# Patient Record
Sex: Male | Born: 1935
Health system: Southern US, Community
[De-identification: ages and names within clinical notes are randomized; demographics above are authoritative.]

## PROBLEM LIST (undated history)

## (undated) DIAGNOSIS — K259 Gastric ulcer, unspecified as acute or chronic, without hemorrhage or perforation: Secondary | ICD-10-CM

## (undated) DIAGNOSIS — E785 Hyperlipidemia, unspecified: Secondary | ICD-10-CM

## (undated) DIAGNOSIS — C801 Malignant (primary) neoplasm, unspecified: Secondary | ICD-10-CM

## (undated) DIAGNOSIS — I2510 Atherosclerotic heart disease of native coronary artery without angina pectoris: Secondary | ICD-10-CM

## (undated) DIAGNOSIS — I10 Essential (primary) hypertension: Secondary | ICD-10-CM

## (undated) DIAGNOSIS — M4310 Spondylolisthesis, site unspecified: Secondary | ICD-10-CM

## (undated) DIAGNOSIS — E119 Type 2 diabetes mellitus without complications: Secondary | ICD-10-CM

## (undated) DIAGNOSIS — M4800 Spinal stenosis, site unspecified: Secondary | ICD-10-CM

## (undated) DIAGNOSIS — I639 Cerebral infarction, unspecified: Secondary | ICD-10-CM

## (undated) HISTORY — PX: ANKLE SURGERY: SHX546

## (undated) HISTORY — PX: OTHER SURGICAL HISTORY: SHX169

## (undated) HISTORY — PX: CORONARY ANGIOPLASTY: SHX604

## (undated) HISTORY — PX: CARDIAC CATHETERIZATION: SHX172

## (undated) HISTORY — PX: NECK EXPLORATION: SHX2077

---

## 2014-09-02 DIAGNOSIS — R972 Elevated prostate specific antigen [PSA]: Secondary | ICD-10-CM | POA: Diagnosis not present

## 2014-09-02 DIAGNOSIS — Z6826 Body mass index (BMI) 26.0-26.9, adult: Secondary | ICD-10-CM | POA: Diagnosis not present

## 2014-09-02 DIAGNOSIS — R7309 Other abnormal glucose: Secondary | ICD-10-CM | POA: Diagnosis not present

## 2014-09-02 DIAGNOSIS — I2510 Atherosclerotic heart disease of native coronary artery without angina pectoris: Secondary | ICD-10-CM | POA: Diagnosis not present

## 2014-09-02 DIAGNOSIS — I10 Essential (primary) hypertension: Secondary | ICD-10-CM | POA: Diagnosis not present

## 2014-09-02 DIAGNOSIS — E038 Other specified hypothyroidism: Secondary | ICD-10-CM | POA: Diagnosis not present

## 2014-11-21 DIAGNOSIS — I10 Essential (primary) hypertension: Secondary | ICD-10-CM | POA: Diagnosis not present

## 2014-11-21 DIAGNOSIS — A084 Viral intestinal infection, unspecified: Secondary | ICD-10-CM | POA: Diagnosis not present

## 2014-11-21 DIAGNOSIS — C8590 Non-Hodgkin lymphoma, unspecified, unspecified site: Secondary | ICD-10-CM | POA: Diagnosis not present

## 2014-11-21 DIAGNOSIS — E784 Other hyperlipidemia: Secondary | ICD-10-CM | POA: Diagnosis not present

## 2014-11-21 DIAGNOSIS — E038 Other specified hypothyroidism: Secondary | ICD-10-CM | POA: Diagnosis not present

## 2015-08-05 DIAGNOSIS — C8590 Non-Hodgkin lymphoma, unspecified, unspecified site: Secondary | ICD-10-CM | POA: Diagnosis not present

## 2015-08-05 DIAGNOSIS — J189 Pneumonia, unspecified organism: Secondary | ICD-10-CM | POA: Diagnosis not present

## 2015-08-05 DIAGNOSIS — E038 Other specified hypothyroidism: Secondary | ICD-10-CM | POA: Diagnosis not present

## 2015-08-05 DIAGNOSIS — E784 Other hyperlipidemia: Secondary | ICD-10-CM | POA: Diagnosis not present

## 2015-08-05 DIAGNOSIS — E1169 Type 2 diabetes mellitus with other specified complication: Secondary | ICD-10-CM | POA: Diagnosis not present

## 2015-08-05 DIAGNOSIS — R6889 Other general symptoms and signs: Secondary | ICD-10-CM | POA: Diagnosis not present

## 2015-12-30 DIAGNOSIS — I10 Essential (primary) hypertension: Secondary | ICD-10-CM | POA: Diagnosis not present

## 2015-12-30 DIAGNOSIS — E039 Hypothyroidism, unspecified: Secondary | ICD-10-CM | POA: Diagnosis not present

## 2015-12-30 DIAGNOSIS — I739 Peripheral vascular disease, unspecified: Secondary | ICD-10-CM | POA: Diagnosis not present

## 2016-01-05 DIAGNOSIS — I739 Peripheral vascular disease, unspecified: Secondary | ICD-10-CM | POA: Diagnosis not present

## 2016-02-19 DIAGNOSIS — Z8579 Personal history of other malignant neoplasms of lymphoid, hematopoietic and related tissues: Secondary | ICD-10-CM | POA: Diagnosis not present

## 2016-02-19 DIAGNOSIS — I10 Essential (primary) hypertension: Secondary | ICD-10-CM | POA: Diagnosis not present

## 2016-02-19 DIAGNOSIS — I2510 Atherosclerotic heart disease of native coronary artery without angina pectoris: Secondary | ICD-10-CM | POA: Diagnosis not present

## 2016-02-19 DIAGNOSIS — E785 Hyperlipidemia, unspecified: Secondary | ICD-10-CM | POA: Diagnosis not present

## 2016-02-19 DIAGNOSIS — I6990 Unspecified sequelae of unspecified cerebrovascular disease: Secondary | ICD-10-CM | POA: Diagnosis not present

## 2016-03-09 DIAGNOSIS — I10 Essential (primary) hypertension: Secondary | ICD-10-CM | POA: Diagnosis not present

## 2016-03-09 DIAGNOSIS — E785 Hyperlipidemia, unspecified: Secondary | ICD-10-CM | POA: Diagnosis not present

## 2016-03-09 DIAGNOSIS — I2510 Atherosclerotic heart disease of native coronary artery without angina pectoris: Secondary | ICD-10-CM | POA: Diagnosis not present

## 2016-03-09 DIAGNOSIS — I6990 Unspecified sequelae of unspecified cerebrovascular disease: Secondary | ICD-10-CM | POA: Diagnosis not present

## 2016-03-09 DIAGNOSIS — Z8579 Personal history of other malignant neoplasms of lymphoid, hematopoietic and related tissues: Secondary | ICD-10-CM | POA: Diagnosis not present

## 2016-04-15 DIAGNOSIS — E039 Hypothyroidism, unspecified: Secondary | ICD-10-CM | POA: Diagnosis not present

## 2016-04-15 DIAGNOSIS — I10 Essential (primary) hypertension: Secondary | ICD-10-CM | POA: Diagnosis not present

## 2016-04-15 DIAGNOSIS — E785 Hyperlipidemia, unspecified: Secondary | ICD-10-CM | POA: Diagnosis not present

## 2016-09-06 DIAGNOSIS — J189 Pneumonia, unspecified organism: Secondary | ICD-10-CM | POA: Diagnosis not present

## 2016-09-06 DIAGNOSIS — R6889 Other general symptoms and signs: Secondary | ICD-10-CM | POA: Diagnosis not present

## 2016-09-06 DIAGNOSIS — Z9181 History of falling: Secondary | ICD-10-CM | POA: Diagnosis not present

## 2016-09-06 DIAGNOSIS — E063 Autoimmune thyroiditis: Secondary | ICD-10-CM | POA: Diagnosis not present

## 2016-09-06 DIAGNOSIS — R7309 Other abnormal glucose: Secondary | ICD-10-CM | POA: Diagnosis not present

## 2016-10-31 DIAGNOSIS — I639 Cerebral infarction, unspecified: Secondary | ICD-10-CM | POA: Diagnosis not present

## 2016-10-31 DIAGNOSIS — I2510 Atherosclerotic heart disease of native coronary artery without angina pectoris: Secondary | ICD-10-CM | POA: Diagnosis not present

## 2016-10-31 DIAGNOSIS — R7309 Other abnormal glucose: Secondary | ICD-10-CM | POA: Diagnosis not present

## 2016-10-31 DIAGNOSIS — E063 Autoimmune thyroiditis: Secondary | ICD-10-CM | POA: Diagnosis not present

## 2016-10-31 DIAGNOSIS — E784 Other hyperlipidemia: Secondary | ICD-10-CM | POA: Diagnosis not present

## 2016-11-09 DIAGNOSIS — I639 Cerebral infarction, unspecified: Secondary | ICD-10-CM | POA: Diagnosis not present

## 2016-12-05 DIAGNOSIS — R7309 Other abnormal glucose: Secondary | ICD-10-CM | POA: Diagnosis not present

## 2016-12-05 DIAGNOSIS — I639 Cerebral infarction, unspecified: Secondary | ICD-10-CM | POA: Diagnosis not present

## 2016-12-05 DIAGNOSIS — E063 Autoimmune thyroiditis: Secondary | ICD-10-CM | POA: Diagnosis not present

## 2016-12-05 DIAGNOSIS — E784 Other hyperlipidemia: Secondary | ICD-10-CM | POA: Diagnosis not present

## 2016-12-05 DIAGNOSIS — I10 Essential (primary) hypertension: Secondary | ICD-10-CM | POA: Diagnosis not present

## 2016-12-05 DIAGNOSIS — C8590 Non-Hodgkin lymphoma, unspecified, unspecified site: Secondary | ICD-10-CM | POA: Diagnosis not present

## 2016-12-21 DIAGNOSIS — I2510 Atherosclerotic heart disease of native coronary artery without angina pectoris: Secondary | ICD-10-CM | POA: Diagnosis not present

## 2016-12-21 DIAGNOSIS — Z8579 Personal history of other malignant neoplasms of lymphoid, hematopoietic and related tissues: Secondary | ICD-10-CM | POA: Diagnosis not present

## 2016-12-21 DIAGNOSIS — I6990 Unspecified sequelae of unspecified cerebrovascular disease: Secondary | ICD-10-CM | POA: Diagnosis not present

## 2016-12-21 DIAGNOSIS — I10 Essential (primary) hypertension: Secondary | ICD-10-CM | POA: Diagnosis not present

## 2016-12-21 DIAGNOSIS — E785 Hyperlipidemia, unspecified: Secondary | ICD-10-CM | POA: Diagnosis not present

## 2017-06-13 DIAGNOSIS — I10 Essential (primary) hypertension: Secondary | ICD-10-CM | POA: Diagnosis not present

## 2017-06-13 DIAGNOSIS — M79605 Pain in left leg: Secondary | ICD-10-CM | POA: Diagnosis not present

## 2017-06-13 DIAGNOSIS — M79604 Pain in right leg: Secondary | ICD-10-CM | POA: Diagnosis not present

## 2017-06-21 DIAGNOSIS — M79604 Pain in right leg: Secondary | ICD-10-CM | POA: Diagnosis not present

## 2017-06-21 DIAGNOSIS — M79605 Pain in left leg: Secondary | ICD-10-CM | POA: Diagnosis not present

## 2017-11-08 DIAGNOSIS — E785 Hyperlipidemia, unspecified: Secondary | ICD-10-CM | POA: Diagnosis not present

## 2017-11-08 DIAGNOSIS — I2510 Atherosclerotic heart disease of native coronary artery without angina pectoris: Secondary | ICD-10-CM | POA: Diagnosis not present

## 2017-11-08 DIAGNOSIS — Z9861 Coronary angioplasty status: Secondary | ICD-10-CM | POA: Diagnosis not present

## 2017-11-08 DIAGNOSIS — I10 Essential (primary) hypertension: Secondary | ICD-10-CM | POA: Diagnosis not present

## 2017-11-08 DIAGNOSIS — I6523 Occlusion and stenosis of bilateral carotid arteries: Secondary | ICD-10-CM | POA: Diagnosis not present

## 2017-11-20 DIAGNOSIS — Z9861 Coronary angioplasty status: Secondary | ICD-10-CM | POA: Diagnosis not present

## 2017-11-20 DIAGNOSIS — I6523 Occlusion and stenosis of bilateral carotid arteries: Secondary | ICD-10-CM | POA: Diagnosis not present

## 2017-11-30 DIAGNOSIS — H2513 Age-related nuclear cataract, bilateral: Secondary | ICD-10-CM | POA: Diagnosis not present

## 2017-11-30 DIAGNOSIS — H5211 Myopia, right eye: Secondary | ICD-10-CM | POA: Diagnosis not present

## 2017-11-30 DIAGNOSIS — H52223 Regular astigmatism, bilateral: Secondary | ICD-10-CM | POA: Diagnosis not present

## 2017-11-30 DIAGNOSIS — H524 Presbyopia: Secondary | ICD-10-CM | POA: Diagnosis not present

## 2017-12-11 DIAGNOSIS — Z9181 History of falling: Secondary | ICD-10-CM | POA: Diagnosis not present

## 2017-12-11 DIAGNOSIS — Z79899 Other long term (current) drug therapy: Secondary | ICD-10-CM | POA: Diagnosis not present

## 2017-12-11 DIAGNOSIS — E063 Autoimmune thyroiditis: Secondary | ICD-10-CM | POA: Diagnosis not present

## 2017-12-11 DIAGNOSIS — Z1331 Encounter for screening for depression: Secondary | ICD-10-CM | POA: Diagnosis not present

## 2017-12-11 DIAGNOSIS — I10 Essential (primary) hypertension: Secondary | ICD-10-CM | POA: Diagnosis not present

## 2017-12-11 DIAGNOSIS — E785 Hyperlipidemia, unspecified: Secondary | ICD-10-CM | POA: Diagnosis not present

## 2017-12-11 DIAGNOSIS — Z0000 Encounter for general adult medical examination without abnormal findings: Secondary | ICD-10-CM | POA: Diagnosis not present

## 2017-12-11 DIAGNOSIS — E559 Vitamin D deficiency, unspecified: Secondary | ICD-10-CM | POA: Diagnosis not present

## 2017-12-11 DIAGNOSIS — R7309 Other abnormal glucose: Secondary | ICD-10-CM | POA: Diagnosis not present

## 2017-12-27 DIAGNOSIS — E785 Hyperlipidemia, unspecified: Secondary | ICD-10-CM | POA: Diagnosis not present

## 2017-12-27 DIAGNOSIS — I10 Essential (primary) hypertension: Secondary | ICD-10-CM | POA: Diagnosis not present

## 2017-12-27 DIAGNOSIS — Z789 Other specified health status: Secondary | ICD-10-CM | POA: Diagnosis not present

## 2017-12-27 DIAGNOSIS — Z9861 Coronary angioplasty status: Secondary | ICD-10-CM | POA: Diagnosis not present

## 2017-12-27 DIAGNOSIS — I2510 Atherosclerotic heart disease of native coronary artery without angina pectoris: Secondary | ICD-10-CM | POA: Diagnosis not present

## 2018-03-13 DIAGNOSIS — E785 Hyperlipidemia, unspecified: Secondary | ICD-10-CM | POA: Diagnosis not present

## 2018-03-13 DIAGNOSIS — E559 Vitamin D deficiency, unspecified: Secondary | ICD-10-CM | POA: Diagnosis not present

## 2018-03-13 DIAGNOSIS — I10 Essential (primary) hypertension: Secondary | ICD-10-CM | POA: Diagnosis not present

## 2018-03-13 DIAGNOSIS — I2510 Atherosclerotic heart disease of native coronary artery without angina pectoris: Secondary | ICD-10-CM | POA: Diagnosis not present

## 2018-03-13 DIAGNOSIS — R7309 Other abnormal glucose: Secondary | ICD-10-CM | POA: Diagnosis not present

## 2018-03-13 DIAGNOSIS — E063 Autoimmune thyroiditis: Secondary | ICD-10-CM | POA: Diagnosis not present

## 2018-04-16 DIAGNOSIS — M545 Low back pain: Secondary | ICD-10-CM | POA: Diagnosis not present

## 2018-04-16 DIAGNOSIS — M48061 Spinal stenosis, lumbar region without neurogenic claudication: Secondary | ICD-10-CM | POA: Diagnosis not present

## 2018-04-18 DIAGNOSIS — E785 Hyperlipidemia, unspecified: Secondary | ICD-10-CM | POA: Diagnosis not present

## 2018-04-18 DIAGNOSIS — Z9861 Coronary angioplasty status: Secondary | ICD-10-CM | POA: Diagnosis not present

## 2018-04-18 DIAGNOSIS — Z789 Other specified health status: Secondary | ICD-10-CM | POA: Diagnosis not present

## 2018-04-18 DIAGNOSIS — I2510 Atherosclerotic heart disease of native coronary artery without angina pectoris: Secondary | ICD-10-CM | POA: Diagnosis not present

## 2018-04-18 DIAGNOSIS — I10 Essential (primary) hypertension: Secondary | ICD-10-CM | POA: Diagnosis not present

## 2018-05-09 DIAGNOSIS — M48062 Spinal stenosis, lumbar region with neurogenic claudication: Secondary | ICD-10-CM | POA: Diagnosis not present

## 2018-05-16 DIAGNOSIS — M48062 Spinal stenosis, lumbar region with neurogenic claudication: Secondary | ICD-10-CM | POA: Diagnosis not present

## 2018-06-20 DIAGNOSIS — J189 Pneumonia, unspecified organism: Secondary | ICD-10-CM | POA: Diagnosis not present

## 2018-08-23 DIAGNOSIS — E063 Autoimmune thyroiditis: Secondary | ICD-10-CM | POA: Diagnosis not present

## 2018-08-23 DIAGNOSIS — M5431 Sciatica, right side: Secondary | ICD-10-CM | POA: Diagnosis not present

## 2018-08-23 DIAGNOSIS — R7309 Other abnormal glucose: Secondary | ICD-10-CM | POA: Diagnosis not present

## 2018-08-23 DIAGNOSIS — E785 Hyperlipidemia, unspecified: Secondary | ICD-10-CM | POA: Diagnosis not present

## 2018-08-23 DIAGNOSIS — I2510 Atherosclerotic heart disease of native coronary artery without angina pectoris: Secondary | ICD-10-CM | POA: Diagnosis not present

## 2018-09-25 DIAGNOSIS — M48062 Spinal stenosis, lumbar region with neurogenic claudication: Secondary | ICD-10-CM | POA: Diagnosis not present

## 2019-04-14 DIAGNOSIS — R339 Retention of urine, unspecified: Secondary | ICD-10-CM | POA: Diagnosis not present

## 2019-04-14 DIAGNOSIS — N390 Urinary tract infection, site not specified: Secondary | ICD-10-CM | POA: Diagnosis not present

## 2019-04-14 DIAGNOSIS — B9689 Other specified bacterial agents as the cause of diseases classified elsewhere: Secondary | ICD-10-CM | POA: Diagnosis not present

## 2019-04-14 DIAGNOSIS — R338 Other retention of urine: Secondary | ICD-10-CM | POA: Diagnosis not present

## 2019-04-14 DIAGNOSIS — N320 Bladder-neck obstruction: Secondary | ICD-10-CM | POA: Diagnosis not present

## 2019-04-16 DIAGNOSIS — I10 Essential (primary) hypertension: Secondary | ICD-10-CM | POA: Diagnosis not present

## 2019-04-16 DIAGNOSIS — M48062 Spinal stenosis, lumbar region with neurogenic claudication: Secondary | ICD-10-CM | POA: Diagnosis not present

## 2019-04-16 DIAGNOSIS — R339 Retention of urine, unspecified: Secondary | ICD-10-CM | POA: Diagnosis not present

## 2019-04-16 DIAGNOSIS — M4316 Spondylolisthesis, lumbar region: Secondary | ICD-10-CM | POA: Diagnosis not present

## 2019-04-18 DIAGNOSIS — N390 Urinary tract infection, site not specified: Secondary | ICD-10-CM | POA: Diagnosis not present

## 2019-04-18 DIAGNOSIS — R339 Retention of urine, unspecified: Secondary | ICD-10-CM | POA: Diagnosis not present

## 2019-04-22 ENCOUNTER — Other Ambulatory Visit: Payer: Self-pay | Admitting: Neurosurgery

## 2019-04-25 DIAGNOSIS — N390 Urinary tract infection, site not specified: Secondary | ICD-10-CM | POA: Diagnosis not present

## 2019-04-25 DIAGNOSIS — R339 Retention of urine, unspecified: Secondary | ICD-10-CM | POA: Diagnosis not present

## 2019-05-03 DIAGNOSIS — Z789 Other specified health status: Secondary | ICD-10-CM | POA: Diagnosis not present

## 2019-05-03 DIAGNOSIS — Z9861 Coronary angioplasty status: Secondary | ICD-10-CM | POA: Diagnosis not present

## 2019-05-03 DIAGNOSIS — E785 Hyperlipidemia, unspecified: Secondary | ICD-10-CM | POA: Diagnosis not present

## 2019-05-03 DIAGNOSIS — I2510 Atherosclerotic heart disease of native coronary artery without angina pectoris: Secondary | ICD-10-CM | POA: Diagnosis not present

## 2019-05-03 DIAGNOSIS — I10 Essential (primary) hypertension: Secondary | ICD-10-CM | POA: Diagnosis not present

## 2019-05-07 DIAGNOSIS — E559 Vitamin D deficiency, unspecified: Secondary | ICD-10-CM | POA: Diagnosis not present

## 2019-05-07 DIAGNOSIS — M48061 Spinal stenosis, lumbar region without neurogenic claudication: Secondary | ICD-10-CM | POA: Diagnosis not present

## 2019-05-07 DIAGNOSIS — E785 Hyperlipidemia, unspecified: Secondary | ICD-10-CM | POA: Diagnosis not present

## 2019-05-07 DIAGNOSIS — E063 Autoimmune thyroiditis: Secondary | ICD-10-CM | POA: Diagnosis not present

## 2019-05-07 DIAGNOSIS — Z9181 History of falling: Secondary | ICD-10-CM | POA: Diagnosis not present

## 2019-05-07 DIAGNOSIS — I2510 Atherosclerotic heart disease of native coronary artery without angina pectoris: Secondary | ICD-10-CM | POA: Diagnosis not present

## 2019-05-07 DIAGNOSIS — R7309 Other abnormal glucose: Secondary | ICD-10-CM | POA: Diagnosis not present

## 2019-05-13 DIAGNOSIS — M4316 Spondylolisthesis, lumbar region: Secondary | ICD-10-CM | POA: Diagnosis not present

## 2019-05-13 DIAGNOSIS — M545 Low back pain: Secondary | ICD-10-CM | POA: Diagnosis not present

## 2019-05-14 ENCOUNTER — Other Ambulatory Visit: Payer: Self-pay

## 2019-05-14 NOTE — Patient Outreach (Signed)
[facility]) Care Management  05/14/2019  [name] [birth year] [id number]   Medication Adherence call to Mr. David Bird Telephone call to Patient regarding Medication Adherence unable to reach patient. David Bird is showing past due on Lisinopril/Hctz 20/12.5 mg under [facility].   [facility] Direct Dial [phone]  Fax [phone] [email or web address]

## 2019-05-15 NOTE — Pre-Procedure Instructions (Addendum)
David Bird  05/15/2019      [place], [place]  [address] Phone: [phone] Fax: [phone]    Your procedure is scheduled on Oct. 26  Report to [facility] Entrance A at 5:30 A.M.  Call this number if you have problems the morning of surgery:  [phone]   Remember:  Do not eat or drink after midnight.      Take these medicines the morning of surgery with A SIP OF WATER :              Atenolol (tenormin)             Bethanechol (urecholine)  macrodid             Rosuvastatin (crestor)             tamsulosin (flomax)             Tramadol if needed          7 days prior to surgery STOP taking any Aspirin (unless otherwise instructed by your surgeon), Aleve, Naproxen, Ibuprofen, Motrin, Advil, Goody's, BC's, all herbal medications, fish oil, and all vitamins.                   How to Manage Your Diabetes Before and After Surgery  Why is it important to control my blood sugar before and after surgery? . Improving blood sugar levels before and after surgery helps healing and can limit problems. . A way of improving blood sugar control is eating a healthy diet by: o  Eating less sugar and carbohydrates o  Increasing activity/exercise o  Talking with your doctor about reaching your blood sugar goals . High blood sugars (greater than 180 mg/dL) can raise your risk of infections and slow your recovery, so you will need to focus on controlling your diabetes during the weeks before surgery. . Make sure that the doctor who takes care of your diabetes knows about your planned surgery including the date and location.  How do I manage my blood sugar before surgery? . Check your blood sugar at least 4 times a day, starting 2 days before surgery, to make sure that the level is not too high or low. o Check your blood sugar the morning of your surgery when you wake up and every 2 hours until  you get to the Short Stay unit. . If your blood sugar is less than 70 mg/dL, you will need to treat for low blood sugar: o Do not take insulin. o Treat a low blood sugar (less than 70 mg/dL) with  cup of clear juice (cranberry or apple), 4 glucose tablets, OR glucose gel. Recheck blood sugar in 15 minutes after treatment (to make sure it is greater than 70 mg/dL). If your blood sugar is not greater than 70 mg/dL on recheck, call [phone] o  for further instructions. . Report your blood sugar to the short stay nurse when you get to Short Stay.  . If you are admitted to the hospital after surgery: o Your blood sugar will be checked by the staff and you will probably be given insulin after surgery (instead of oral diabetes medicines) to make sure you have good blood sugar levels. o The goal for blood sugar control after surgery is 80-180 mg/dL.      WHAT DO I DO ABOUT MY DIABETES MEDICATION?   [name] Kitchen Do not  take oral diabetes medicines (pills) the morning of surgery. (metformin/glucophage)                  Do not wear jewelry.  Do not wear lotions, powders, or perfumes, or deodorant.  Do not shave 48 hours prior to surgery.  Men may shave face and neck.  Do not bring valuables to the hospital.  [facility] is not responsible for any belongings or valuables.  Contacts, dentures or bridgework may not be worn into surgery.  Leave your suitcase in the car.  After surgery it may be brought to your room.  For patients admitted to the hospital, discharge time will be determined by your treatment team.  Patients discharged the day of surgery will not be allowed to drive home.        [place]- Preparing For Surgery  Before surgery, you can play an important role. Because skin is not sterile, your skin needs to be as free of germs as possible. You can reduce the number of germs on your skin by washing with CHG (chlorahexidine gluconate) Soap before surgery.  CHG is an antiseptic  cleaner which kills germs and bonds with the skin to continue killing germs even after washing.    Oral Hygiene is also important to reduce your risk of infection.  Remember - BRUSH YOUR TEETH THE MORNING OF SURGERY WITH YOUR REGULAR TOOTHPASTE  Please do not use if you have an allergy to CHG or antibacterial soaps. If your skin becomes reddened/irritated stop using the CHG.  Do not shave (including legs and underarms) for at least 48 hours prior to first CHG shower. It is OK to shave your face.  Please follow these instructions carefully.   1. Shower the NIGHT BEFORE SURGERY and the MORNING OF SURGERY with CHG.   2. If you chose to wash your hair, wash your hair first as usual with your normal shampoo.  3. After you shampoo, rinse your hair and body thoroughly to remove the shampoo.  4. Use CHG as you would any other liquid soap. You can apply CHG directly to the skin and wash gently with a scrungie or a clean washcloth.   5. Apply the CHG Soap to your body ONLY FROM THE NECK DOWN.  Do not use on open wounds or open sores. Avoid contact with your eyes, ears, mouth and genitals (private parts). Wash Face and genitals (private parts)  with your normal soap.  6. Wash thoroughly, paying special attention to the area where your surgery will be performed.  7. Thoroughly rinse your body with warm water from the neck down.  8. DO NOT shower/wash with your normal soap after using and rinsing off the CHG Soap.  9. Pat yourself dry with a CLEAN TOWEL.  10. Wear CLEAN PAJAMAS to bed the night before surgery, wear comfortable clothes the morning of surgery  11. Place CLEAN SHEETS on your bed the night of your first shower and DO NOT SLEEP WITH PETS.    Day of Surgery:  Do not apply any deodorants/lotions.  Please wear clean clothes to the hospital/surgery center.   Remember to brush your teeth WITH YOUR REGULAR TOOTHPASTE.  Please read over the following fact sheets that you were given.  Coughing and Deep Breathing and Surgical Site Infection Prevention

## 2019-05-16 ENCOUNTER — Encounter (HOSPITAL_COMMUNITY): Payer: Self-pay

## 2019-05-16 ENCOUNTER — Other Ambulatory Visit: Payer: Self-pay

## 2019-05-16 ENCOUNTER — Encounter (HOSPITAL_COMMUNITY)
Admission: RE | Admit: 2019-05-16 | Discharge: 2019-05-16 | Disposition: A | Discharge status: Home / Self Care | Payer: Medicare Other | Source: Ambulatory Visit | Attending: Neurosurgery | Admitting: Neurosurgery

## 2019-05-16 ENCOUNTER — Other Ambulatory Visit (HOSPITAL_COMMUNITY)
Admission: RE | Admit: 2019-05-16 | Discharge: 2019-05-16 | Disposition: A | Discharge status: Home / Self Care | Payer: Medicare Other | Source: Ambulatory Visit | Attending: Neurosurgery | Admitting: Neurosurgery

## 2019-05-16 DIAGNOSIS — Z01812 Encounter for preprocedural laboratory examination: Secondary | ICD-10-CM | POA: Diagnosis not present

## 2019-05-16 HISTORY — DX: Type 2 diabetes mellitus without complications: E11.9

## 2019-05-16 HISTORY — DX: Atherosclerotic heart disease of native coronary artery without angina pectoris: I25.10

## 2019-05-16 HISTORY — DX: Spinal stenosis, site unspecified: M48.00

## 2019-05-16 HISTORY — DX: Essential (primary) hypertension: I10

## 2019-05-16 HISTORY — DX: Gastric ulcer, unspecified as acute or chronic, without hemorrhage or perforation: K25.9

## 2019-05-16 HISTORY — DX: Malignant (primary) neoplasm, unspecified: C80.1

## 2019-05-16 HISTORY — DX: Hyperlipidemia, unspecified: E78.5

## 2019-05-16 HISTORY — DX: Cerebral infarction, unspecified: I63.9

## 2019-05-16 LAB — CBC
HCT: 42.1 % (ref 39.0–52.0)
Hemoglobin: 13.7 g/dL (ref 13.0–17.0)
MCH: 29.3 pg (ref 26.0–34.0)
MCHC: 32.5 g/dL (ref 30.0–36.0)
MCV: 90 fL (ref 80.0–100.0)
Platelets: 312 10*3/uL (ref 150–400)
RBC: 4.68 MIL/uL (ref 4.22–5.81)
RDW: 13.1 % (ref 11.5–15.5)
WBC: 7.1 10*3/uL (ref 4.0–10.5)
nRBC: 0 % (ref 0.0–0.2)

## 2019-05-16 LAB — BASIC METABOLIC PANEL
Anion gap: 12 (ref 5–15)
BUN: 23 mg/dL (ref 8–23)
CO2: 23 mmol/L (ref 22–32)
Calcium: 9.6 mg/dL (ref 8.9–10.3)
Chloride: 100 mmol/L (ref 98–111)
Creatinine, Ser: 1.17 mg/dL (ref 0.61–1.24)
GFR calc Af Amer: >60 mL/min (ref >60)
GFR calc non Af Amer: 58 mL/min — ABNORMAL LOW (ref >60)
Glucose, Bld: 98 mg/dL (ref 70–99)
Potassium: 4.1 mmol/L (ref 3.5–5.1)
Sodium: 135 mmol/L (ref 135–145)

## 2019-05-16 LAB — TYPE AND SCREEN
ABO/RH(D): B NEG
Antibody Screen: NEGATIVE

## 2019-05-16 LAB — HEMOGLOBIN A1C
Hgb A1c MFr Bld: 6.8 % — ABNORMAL HIGH (ref 4.8–5.6)
Mean Plasma Glucose: 148.46 mg/dL

## 2019-05-16 LAB — SURGICAL PCR SCREEN
MRSA, PCR: NEGATIVE
Staphylococcus aureus: NEGATIVE

## 2019-05-16 LAB — GLUCOSE, CAPILLARY: Glucose-Capillary: 101 mg/dL — ABNORMAL HIGH (ref 70–99)

## 2019-05-16 LAB — ABO/RH: ABO/RH(D): B NEG

## 2019-05-16 NOTE — Progress Notes (Addendum)
PCP - Dr Wende Neighbors  Cardiologist - Dr Abran Richard  Chest x-ray - na  EKG - has a stress test 05/17/2019,patientsaid they will do one. I called the office number and spoke with a [facility] Call center person, she said that she will call the office in [place] and ask them to do it.  Stress Test - scheduled for 05/17/2019  ECHO - 2017  Cardiac Cath - yes, unsure of date. Has 1 stent. Was done at [facility] I requested records  Sleep Study - no CPAP -  no LABS-CBC, BMP, A1C, PCR, T/S  ASA-no instructions, I instructed patient to call his surgeon and ask  ERAS- no  HA1C-6.8- Fasting Blood Sugar - has not started checking Checks Blood Sugar ____0_ times a day David Bird reports that the was just recently told that he is a diabetic and he has started on Metformin. David Bird has not picked up CBG machine, he said he would and would begin checking.  I encouraged patient to pick up some glucose tablets or  Glucose Gel and if CBG < that 70 to treat it with either one; instead of drinking juice, which can make surgery be delayed.   Anesthesia- I requested records from Dr T. Folk including EKG that hopefully will be done tomorrow. David Bird arrived for 1300 appointment at 1340 and had to get to [facility] to have Covid test done- patient lives in [place].  David Bird stated that he goes for a stress test tomorrow an d that they will do an EKG then.  I requested records.  Pt denies having chest pain, sob, or fever at this time. All instructions explained to the pt, with a verbal understanding of the material. Pt agrees to go over the instructions while at home for a better understanding. Pt also instructed to self quarantine after being tested for COVID-19. The opportunity to ask questions was provided.

## 2019-05-17 ENCOUNTER — Other Ambulatory Visit: Payer: Self-pay | Admitting: Neurosurgery

## 2019-05-17 DIAGNOSIS — Z01810 Encounter for preprocedural cardiovascular examination: Secondary | ICD-10-CM | POA: Diagnosis not present

## 2019-05-17 NOTE — Anesthesia Preprocedure Evaluation (Addendum)
Anesthesia Evaluation  Patient identified by MRN, date of birth, ID band Patient awake    Reviewed: Allergy & Precautions, H&P , NPO status , Patient's Chart, lab work & pertinent test results  Airway Mallampati: II   Neck ROM: full    Dental  (+) Upper Dentures, Lower Dentures, Dental Advisory Given   Pulmonary former smoker,    breath sounds clear to auscultation       Cardiovascular hypertension, + CAD   Rhythm:regular Rate:Normal  Recent nuclear stress test: low risk. EF 71%.   Neuro/Psych CVA    GI/Hepatic PUD,   Endo/Other  diabetes, Type 2  Renal/GU      Musculoskeletal   Abdominal   Peds  Hematology   Anesthesia Other Findings   Reproductive/Obstetrics                           Anesthesia Physical Anesthesia Plan  ASA: III  Anesthesia Plan: General   Post-op Pain Management:    Induction: Intravenous  PONV Risk Score and Plan: 2 and Ondansetron, Dexamethasone and Treatment may vary due to age or medical condition  Airway Management Planned: Oral ETT  Additional Equipment:   Intra-op Plan:   Post-operative Plan: Extubation in OR  Informed Consent: I have reviewed the patients History and Physical, chart, labs and discussed the procedure including the risks, benefits and alternatives for the proposed anesthesia with the patient or authorized representative who has indicated his/her understanding and acceptance.       Plan Discussed with: CRNA, Anesthesiologist and Surgeon  Anesthesia Plan Comments: (Follows with Dr. Otho Perl at [facility] for CAD s/p remote PTCA. He was seen 05/03/19 for preop clearance. Per note, the pt was "doing well from a cardiology standpoint denying any anginal sounding chest discomfort and seems to be stable and asymptomatic with his coronary disease and previous interventions. Unfortunately, his activity is severely limited by his pseudoclaudication  symptoms and his spinal stenosis. The surgeon is considering operating on him and asked for my preoperative risk assessment. It is difficult to determine what Mr. Dor's cardiac risk is as he is so limited with respect to his claudication and back pain. Unfortunately, I do not have complete details of what his coronary anatomy was previously and what intervention or interventions may have been performed. I think he probably warrants some preoperative assessment and would like him to have a Lexiscan sestamibi study."  Lexiscan done 05/17/19 was low risk and pt was cleared as low risk for surgery.  Nuclear Stress 05/17/19 (care everywhere): IMPRESSION: 1. Question mild reversibility along the anterior apex. Findings at the apex are equivocal. No other areas are concerning for reversible ischemia or infarct.  2. Normal left ventricular wall motion.  3. Left ventricular ejection fraction 71%  4. Non invasive risk stratification*: Low)     Anesthesia Quick Evaluation

## 2019-05-18 LAB — NOVEL CORONAVIRUS, NAA (HOSP ORDER, SEND-OUT TO REF LAB; TAT 18-24 HRS): SARS-CoV-2, NAA: NOT DETECTED

## 2019-05-20 ENCOUNTER — Inpatient Hospital Stay (HOSPITAL_COMMUNITY): Payer: Medicare Other | Admitting: Physician Assistant

## 2019-05-20 ENCOUNTER — Inpatient Hospital Stay (HOSPITAL_COMMUNITY): Payer: Medicare Other

## 2019-05-20 ENCOUNTER — Inpatient Hospital Stay (HOSPITAL_COMMUNITY): Payer: Medicare Other | Admitting: Certified Registered Nurse Anesthetist

## 2019-05-20 ENCOUNTER — Encounter (HOSPITAL_COMMUNITY): Payer: Self-pay

## 2019-05-20 ENCOUNTER — Other Ambulatory Visit: Payer: Self-pay

## 2019-05-20 ENCOUNTER — Encounter (HOSPITAL_COMMUNITY)
Admission: RE | Disposition: A | Discharge status: Home / Self Care | Payer: Self-pay | Source: Ambulatory Visit | Attending: Neurosurgery

## 2019-05-20 ENCOUNTER — Inpatient Hospital Stay (HOSPITAL_COMMUNITY)
Admission: RE | Admit: 2019-05-20 | Discharge: 2019-05-22 | DRG: 455 | Disposition: A | Discharge status: Home / Self Care | Payer: Medicare Other | Source: Ambulatory Visit | Attending: Neurosurgery | Admitting: Neurosurgery

## 2019-05-20 DIAGNOSIS — I2510 Atherosclerotic heart disease of native coronary artery without angina pectoris: Secondary | ICD-10-CM | POA: Diagnosis not present

## 2019-05-20 DIAGNOSIS — M5116 Intervertebral disc disorders with radiculopathy, lumbar region: Secondary | ICD-10-CM | POA: Diagnosis not present

## 2019-05-20 DIAGNOSIS — I10 Essential (primary) hypertension: Secondary | ICD-10-CM | POA: Diagnosis present

## 2019-05-20 DIAGNOSIS — E119 Type 2 diabetes mellitus without complications: Secondary | ICD-10-CM | POA: Diagnosis not present

## 2019-05-20 DIAGNOSIS — M5136 Other intervertebral disc degeneration, lumbar region: Secondary | ICD-10-CM | POA: Diagnosis not present

## 2019-05-20 DIAGNOSIS — M48062 Spinal stenosis, lumbar region with neurogenic claudication: Secondary | ICD-10-CM | POA: Diagnosis not present

## 2019-05-20 DIAGNOSIS — M4326 Fusion of spine, lumbar region: Secondary | ICD-10-CM | POA: Diagnosis not present

## 2019-05-20 DIAGNOSIS — M4316 Spondylolisthesis, lumbar region: Secondary | ICD-10-CM | POA: Diagnosis present

## 2019-05-20 DIAGNOSIS — Z981 Arthrodesis status: Secondary | ICD-10-CM | POA: Diagnosis not present

## 2019-05-20 DIAGNOSIS — Z7984 Long term (current) use of oral hypoglycemic drugs: Secondary | ICD-10-CM

## 2019-05-20 DIAGNOSIS — Z880 Allergy status to penicillin: Secondary | ICD-10-CM

## 2019-05-20 DIAGNOSIS — Z419 Encounter for procedure for purposes other than remedying health state, unspecified: Secondary | ICD-10-CM

## 2019-05-20 DIAGNOSIS — M4726 Other spondylosis with radiculopathy, lumbar region: Principal | ICD-10-CM | POA: Diagnosis present

## 2019-05-20 DIAGNOSIS — Z87891 Personal history of nicotine dependence: Secondary | ICD-10-CM

## 2019-05-20 DIAGNOSIS — Z923 Personal history of irradiation: Secondary | ICD-10-CM

## 2019-05-20 DIAGNOSIS — M545 Low back pain: Secondary | ICD-10-CM | POA: Diagnosis present

## 2019-05-20 DIAGNOSIS — Z79899 Other long term (current) drug therapy: Secondary | ICD-10-CM | POA: Diagnosis not present

## 2019-05-20 DIAGNOSIS — Z9221 Personal history of antineoplastic chemotherapy: Secondary | ICD-10-CM | POA: Diagnosis not present

## 2019-05-20 DIAGNOSIS — E785 Hyperlipidemia, unspecified: Secondary | ICD-10-CM | POA: Diagnosis present

## 2019-05-20 DIAGNOSIS — Z8572 Personal history of non-Hodgkin lymphomas: Secondary | ICD-10-CM | POA: Diagnosis not present

## 2019-05-20 HISTORY — DX: Spondylolisthesis, site unspecified: M43.10

## 2019-05-20 LAB — GLUCOSE, CAPILLARY
Glucose-Capillary: 124 mg/dL — ABNORMAL HIGH (ref 70–99)
Glucose-Capillary: 105 mg/dL — ABNORMAL HIGH (ref 70–99)
Glucose-Capillary: 133 mg/dL — ABNORMAL HIGH (ref 70–99)
Glucose-Capillary: 151 mg/dL — ABNORMAL HIGH (ref 70–99)
Glucose-Capillary: 229 mg/dL — ABNORMAL HIGH (ref 70–99)

## 2019-05-20 SURGERY — POSTERIOR LUMBAR FUSION 1 LEVEL
Anesthesia: General

## 2019-05-20 MED ORDER — FENTANYL CITRATE (PF) 250 MCG/5ML IJ SOLN
INTRAMUSCULAR | Status: DC | PRN
  Administered 2019-05-20: 50 ug via INTRAVENOUS
  Administered 2019-05-20: 100 ug via INTRAVENOUS
  Administered 2019-05-20: 50 ug via INTRAVENOUS

## 2019-05-20 MED ORDER — ONDANSETRON HCL 4 MG/2ML IJ SOLN: 4.0000 mg | Freq: Four times a day (QID) | INTRAMUSCULAR | Status: DC | PRN

## 2019-05-20 MED ORDER — PROPOFOL 10 MG/ML IV BOLUS
INTRAVENOUS | Status: DC | PRN
  Administered 2019-05-20: 160 mg via INTRAVENOUS

## 2019-05-20 MED ORDER — CYCLOBENZAPRINE HCL 10 MG PO TABS
10.0000 mg | ORAL_TABLET | Freq: Three times a day (TID) | ORAL | Status: DC | PRN
  Administered 2019-05-21 – 2019-05-22 (×2): 10 mg via ORAL
  Filled 2019-05-20 (×2): qty 1

## 2019-05-20 MED ORDER — SODIUM CHLORIDE 0.9 % IV SOLN
INTRAVENOUS | Status: DC | PRN
  Administered 2019-05-20: 500 mL

## 2019-05-20 MED ORDER — TAMSULOSIN HCL 0.4 MG PO CAPS
0.4000 mg | ORAL_CAPSULE | Freq: Every day | ORAL | Status: DC
  Administered 2019-05-21 – 2019-05-22 (×2): 0.4 mg via ORAL
  Filled 2019-05-20 (×2): qty 1

## 2019-05-20 MED ORDER — CEFAZOLIN SODIUM-DEXTROSE 2-4 GM/100ML-% IV SOLN
2.0000 g | Freq: Three times a day (TID) | INTRAVENOUS | Status: AC
  Administered 2019-05-20 – 2019-05-21 (×2): 2 g via INTRAVENOUS
  Filled 2019-05-20 (×2): qty 100

## 2019-05-20 MED ORDER — BUPIVACAINE-EPINEPHRINE (PF) 0.5% -1:200000 IJ SOLN
INTRAMUSCULAR | Status: DC | PRN
  Administered 2019-05-20: 10 mL via PERINEURAL

## 2019-05-20 MED ORDER — ONDANSETRON HCL 4 MG PO TABS: 4.0000 mg | ORAL_TABLET | Freq: Four times a day (QID) | ORAL | Status: DC | PRN

## 2019-05-20 MED ORDER — ACETAMINOPHEN 325 MG PO TABS
650.0000 mg | ORAL_TABLET | ORAL | Status: DC | PRN
  Administered 2019-05-21 – 2019-05-22 (×3): 650 mg via ORAL
  Filled 2019-05-20 (×3): qty 2

## 2019-05-20 MED ORDER — BUPIVACAINE-EPINEPHRINE 0.5% -1:200000 IJ SOLN
INTRAMUSCULAR | Status: AC
  Filled 2019-05-20: qty 1

## 2019-05-20 MED ORDER — LISINOPRIL-HYDROCHLOROTHIAZIDE 20-12.5 MG PO TABS: 1.0000 | ORAL_TABLET | Freq: Every day | ORAL | Status: DC

## 2019-05-20 MED ORDER — SODIUM CHLORIDE 0.9% FLUSH
3.0000 mL | Freq: Two times a day (BID) | INTRAVENOUS | Status: DC
  Administered 2019-05-20: 3 mL via INTRAVENOUS

## 2019-05-20 MED ORDER — ROSUVASTATIN CALCIUM 5 MG PO TABS
5.0000 mg | ORAL_TABLET | Freq: Every day | ORAL | Status: DC
  Administered 2019-05-21 – 2019-05-22 (×2): 5 mg via ORAL
  Filled 2019-05-20 (×2): qty 1

## 2019-05-20 MED ORDER — CHLORHEXIDINE GLUCONATE CLOTH 2 % EX PADS: 6.0000 | MEDICATED_PAD | Freq: Once | CUTANEOUS | Status: DC

## 2019-05-20 MED ORDER — ONDANSETRON HCL 4 MG/2ML IJ SOLN
INTRAMUSCULAR | Status: AC
  Filled 2019-05-20: qty 2

## 2019-05-20 MED ORDER — ACETAMINOPHEN 500 MG PO TABS
1000.0000 mg | ORAL_TABLET | Freq: Four times a day (QID) | ORAL | Status: AC
  Administered 2019-05-20 – 2019-05-21 (×4): 1000 mg via ORAL
  Filled 2019-05-20 (×4): qty 2

## 2019-05-20 MED ORDER — DEXAMETHASONE SODIUM PHOSPHATE 10 MG/ML IJ SOLN
INTRAMUSCULAR | Status: DC | PRN
  Administered 2019-05-20: 8 mg via INTRAVENOUS

## 2019-05-20 MED ORDER — PROPOFOL 10 MG/ML IV BOLUS
INTRAVENOUS | Status: AC
  Filled 2019-05-20: qty 20

## 2019-05-20 MED ORDER — BETHANECHOL CHLORIDE 25 MG PO TABS
25.0000 mg | ORAL_TABLET | Freq: Every day | ORAL | Status: DC
  Administered 2019-05-20 – 2019-05-22 (×3): 25 mg via ORAL
  Filled 2019-05-20 (×4): qty 1

## 2019-05-20 MED ORDER — 0.9 % SODIUM CHLORIDE (POUR BTL) OPTIME
TOPICAL | Status: DC | PRN
  Administered 2019-05-20: 1000 mL

## 2019-05-20 MED ORDER — MORPHINE SULFATE (PF) 4 MG/ML IV SOLN: 4.0000 mg | INTRAVENOUS | Status: DC | PRN

## 2019-05-20 MED ORDER — OXYCODONE HCL 5 MG PO TABS
10.0000 mg | ORAL_TABLET | ORAL | Status: DC | PRN
  Administered 2019-05-22: 10 mg via ORAL
  Filled 2019-05-20: qty 2

## 2019-05-20 MED ORDER — ROCURONIUM BROMIDE 10 MG/ML (PF) SYRINGE
PREFILLED_SYRINGE | INTRAVENOUS | Status: DC | PRN
  Administered 2019-05-20: 20 mg via INTRAVENOUS
  Administered 2019-05-20: 50 mg via INTRAVENOUS

## 2019-05-20 MED ORDER — INSULIN ASPART 100 UNIT/ML ~~LOC~~ SOLN
0.0000 [IU] | Freq: Three times a day (TID) | SUBCUTANEOUS | Status: DC
  Administered 2019-05-21 (×2): 4 [IU] via SUBCUTANEOUS

## 2019-05-20 MED ORDER — FENTANYL CITRATE (PF) 100 MCG/2ML IJ SOLN
25.0000 ug | INTRAMUSCULAR | Status: DC | PRN
  Administered 2019-05-20: 25 ug via INTRAVENOUS

## 2019-05-20 MED ORDER — ONDANSETRON HCL 4 MG/2ML IJ SOLN
INTRAMUSCULAR | Status: DC | PRN
  Administered 2019-05-20: 4 mg via INTRAVENOUS

## 2019-05-20 MED ORDER — ROCURONIUM BROMIDE 10 MG/ML (PF) SYRINGE
PREFILLED_SYRINGE | INTRAVENOUS | Status: AC
  Filled 2019-05-20: qty 10

## 2019-05-20 MED ORDER — SUGAMMADEX SODIUM 200 MG/2ML IV SOLN
INTRAVENOUS | Status: DC | PRN
  Administered 2019-05-20: 200 mg via INTRAVENOUS

## 2019-05-20 MED ORDER — LISINOPRIL 20 MG PO TABS
20.0000 mg | ORAL_TABLET | Freq: Every day | ORAL | Status: DC
  Administered 2019-05-21 – 2019-05-22 (×2): 20 mg via ORAL
  Filled 2019-05-20 (×2): qty 1

## 2019-05-20 MED ORDER — OXYCODONE HCL 5 MG/5ML PO SOLN: 5.0000 mg | Freq: Once | ORAL | Status: DC | PRN

## 2019-05-20 MED ORDER — ATENOLOL 50 MG PO TABS
50.0000 mg | ORAL_TABLET | Freq: Every day | ORAL | Status: DC
  Administered 2019-05-21 – 2019-05-22 (×2): 50 mg via ORAL
  Filled 2019-05-20 (×2): qty 1

## 2019-05-20 MED ORDER — FENTANYL CITRATE (PF) 250 MCG/5ML IJ SOLN
INTRAMUSCULAR | Status: AC
  Filled 2019-05-20: qty 5

## 2019-05-20 MED ORDER — ACETAMINOPHEN 650 MG RE SUPP: 650.0000 mg | RECTAL | Status: DC | PRN

## 2019-05-20 MED ORDER — PHENOL 1.4 % MT LIQD: 1.0000 | OROMUCOSAL | Status: DC | PRN

## 2019-05-20 MED ORDER — LACTATED RINGERS IV SOLN
INTRAVENOUS | Status: DC
  Administered 2019-05-20 (×2): via INTRAVENOUS

## 2019-05-20 MED ORDER — THROMBIN 5000 UNITS EX SOLR
OROMUCOSAL | Status: DC | PRN
  Administered 2019-05-20: 11:00:00 5 mL via TOPICAL

## 2019-05-20 MED ORDER — BACITRACIN ZINC 500 UNIT/GM EX OINT
TOPICAL_OINTMENT | CUTANEOUS | Status: AC
  Filled 2019-05-20: qty 28.35

## 2019-05-20 MED ORDER — HYDROCHLOROTHIAZIDE 12.5 MG PO CAPS
12.5000 mg | ORAL_CAPSULE | Freq: Every day | ORAL | Status: DC
  Administered 2019-05-21 – 2019-05-22 (×2): 12.5 mg via ORAL
  Filled 2019-05-20 (×2): qty 1

## 2019-05-20 MED ORDER — LIDOCAINE 2% (20 MG/ML) 5 ML SYRINGE
INTRAMUSCULAR | Status: DC | PRN
  Administered 2019-05-20: 60 mg via INTRAVENOUS

## 2019-05-20 MED ORDER — DEXAMETHASONE SODIUM PHOSPHATE 10 MG/ML IJ SOLN
INTRAMUSCULAR | Status: AC
  Filled 2019-05-20: qty 1

## 2019-05-20 MED ORDER — CEFAZOLIN SODIUM-DEXTROSE 2-4 GM/100ML-% IV SOLN
INTRAVENOUS | Status: AC
  Filled 2019-05-20: qty 100

## 2019-05-20 MED ORDER — TRAMADOL HCL 50 MG PO TABS: 50.0000 mg | ORAL_TABLET | Freq: Four times a day (QID) | ORAL | Status: DC | PRN

## 2019-05-20 MED ORDER — CEFAZOLIN SODIUM-DEXTROSE 2-4 GM/100ML-% IV SOLN
2.0000 g | INTRAVENOUS | Status: AC
  Administered 2019-05-20: 2 g via INTRAVENOUS

## 2019-05-20 MED ORDER — METFORMIN HCL 500 MG PO TABS
500.0000 mg | ORAL_TABLET | Freq: Two times a day (BID) | ORAL | Status: DC
  Administered 2019-05-20 – 2019-05-22 (×4): 500 mg via ORAL
  Filled 2019-05-20 (×4): qty 1

## 2019-05-20 MED ORDER — OXYCODONE HCL 5 MG PO TABS: 5.0000 mg | ORAL_TABLET | Freq: Once | ORAL | Status: DC | PRN

## 2019-05-20 MED ORDER — SODIUM CHLORIDE 0.9 % IV SOLN
INTRAVENOUS | Status: DC | PRN
  Administered 2019-05-20: 20 ug/min via INTRAVENOUS

## 2019-05-20 MED ORDER — BISACODYL 10 MG RE SUPP: 10.0000 mg | Freq: Every day | RECTAL | Status: DC | PRN

## 2019-05-20 MED ORDER — THROMBIN 5000 UNITS EX SOLR
CUTANEOUS | Status: AC
  Filled 2019-05-20: qty 5000

## 2019-05-20 MED ORDER — SODIUM CHLORIDE 0.9 % IV SOLN: 250.0000 mL | INTRAVENOUS | Status: DC

## 2019-05-20 MED ORDER — DOCUSATE SODIUM 100 MG PO CAPS
100.0000 mg | ORAL_CAPSULE | Freq: Two times a day (BID) | ORAL | Status: DC
  Administered 2019-05-20 – 2019-05-22 (×4): 100 mg via ORAL
  Filled 2019-05-20 (×5): qty 1

## 2019-05-20 MED ORDER — BACITRACIN ZINC 500 UNIT/GM EX OINT
TOPICAL_OINTMENT | CUTANEOUS | Status: DC | PRN
  Administered 2019-05-20: 1 via TOPICAL

## 2019-05-20 MED ORDER — OXYCODONE HCL 5 MG PO TABS
5.0000 mg | ORAL_TABLET | ORAL | Status: DC | PRN
  Administered 2019-05-22: 5 mg via ORAL
  Filled 2019-05-20: qty 1

## 2019-05-20 MED ORDER — FENTANYL CITRATE (PF) 100 MCG/2ML IJ SOLN
INTRAMUSCULAR | Status: AC
  Filled 2019-05-20: qty 2

## 2019-05-20 MED ORDER — BUPIVACAINE LIPOSOME 1.3 % IJ SUSP
INTRAMUSCULAR | Status: DC | PRN
  Administered 2019-05-20: 20 mL

## 2019-05-20 MED ORDER — MENTHOL 3 MG MT LOZG
1.0000 | LOZENGE | OROMUCOSAL | Status: DC | PRN
  Administered 2019-05-21: 3 mg via ORAL
  Filled 2019-05-20: qty 9

## 2019-05-20 MED ORDER — SODIUM CHLORIDE 0.9% FLUSH: 3.0000 mL | INTRAVENOUS | Status: DC | PRN

## 2019-05-20 SURGICAL SUPPLY — 67 items
BAG DECANTER FOR FLEXI CONT (MISCELLANEOUS) ×3 IMPLANT
BASKET BONE COLLECTION (BASKET) ×3 IMPLANT
BENZOIN TINCTURE PRP APPL 2/3 (GAUZE/BANDAGES/DRESSINGS) ×3 IMPLANT
BLADE CLIPPER SURG (BLADE) IMPLANT
BUR MATCHSTICK NEURO 3.0 LAGG (BURR) ×3 IMPLANT
BUR PRECISION FLUTE 6.0 (BURR) ×3 IMPLANT
CANISTER SUCT 3000ML PPV (MISCELLANEOUS) ×3 IMPLANT
CAP LOCK DLX THRD (Cap) ×12 IMPLANT
CARTRIDGE OIL MAESTRO DRILL (MISCELLANEOUS) ×1 IMPLANT
CLOSURE WOUND 1/2 X4 (GAUZE/BANDAGES/DRESSINGS) ×1
CONT SPEC 4OZ CLIKSEAL STRL BL (MISCELLANEOUS) ×3 IMPLANT
COVER BACK TABLE 60X90IN (DRAPES) ×3 IMPLANT
COVER WAND RF STERILE (DRAPES) IMPLANT
DECANTER SPIKE VIAL GLASS SM (MISCELLANEOUS) ×3 IMPLANT
DIFFUSER DRILL AIR PNEUMATIC (MISCELLANEOUS) ×3 IMPLANT
DRAPE C-ARM 42X72 X-RAY (DRAPES) ×6 IMPLANT
DRAPE HALF SHEET 40X57 (DRAPES) ×3 IMPLANT
DRAPE LAPAROTOMY 100X72X124 (DRAPES) ×3 IMPLANT
DRAPE SURG 17X23 STRL (DRAPES) ×12 IMPLANT
DRSG OPSITE POSTOP 4X8 (GAUZE/BANDAGES/DRESSINGS) ×3 IMPLANT
ELECT BLADE 4.0 EZ CLEAN MEGAD (MISCELLANEOUS) ×3
ELECT REM PT RETURN 9FT ADLT (ELECTROSURGICAL) ×3
ELECTRODE BLDE 4.0 EZ CLN MEGD (MISCELLANEOUS) ×1 IMPLANT
ELECTRODE REM PT RTRN 9FT ADLT (ELECTROSURGICAL) ×1 IMPLANT
EVACUATOR 1/8 PVC DRAIN (DRAIN) IMPLANT
GAUZE 4X4 16PLY RFD (DISPOSABLE) ×3 IMPLANT
GAUZE SPONGE 4X4 12PLY STRL (GAUZE/BANDAGES/DRESSINGS) ×3 IMPLANT
GLOVE BIO SURGEON STRL SZ8 (GLOVE) ×6 IMPLANT
GLOVE BIO SURGEON STRL SZ8.5 (GLOVE) ×6 IMPLANT
GLOVE BIOGEL PI IND STRL 7.5 (GLOVE) ×1 IMPLANT
GLOVE BIOGEL PI INDICATOR 7.5 (GLOVE) ×2
GLOVE ECLIPSE 6.5 STRL STRAW (GLOVE) ×3 IMPLANT
GLOVE ECLIPSE 7.5 STRL STRAW (GLOVE) ×6 IMPLANT
GLOVE EXAM NITRILE XL STR (GLOVE) IMPLANT
GOWN STRL REUS W/ TWL LRG LVL3 (GOWN DISPOSABLE) IMPLANT
GOWN STRL REUS W/ TWL XL LVL3 (GOWN DISPOSABLE) ×2 IMPLANT
GOWN STRL REUS W/TWL 2XL LVL3 (GOWN DISPOSABLE) IMPLANT
GOWN STRL REUS W/TWL LRG LVL3 (GOWN DISPOSABLE)
GOWN STRL REUS W/TWL XL LVL3 (GOWN DISPOSABLE) ×4
HEMOSTAT POWDER KIT SURGIFOAM (HEMOSTASIS) ×3 IMPLANT
KIT BASIN OR (CUSTOM PROCEDURE TRAY) ×3 IMPLANT
KIT TURNOVER KIT B (KITS) ×3 IMPLANT
MILL MEDIUM DISP (BLADE) IMPLANT
NEEDLE HYPO 21X1.5 SAFETY (NEEDLE) ×3 IMPLANT
NEEDLE HYPO 22GX1.5 SAFETY (NEEDLE) ×3 IMPLANT
NS IRRIG 1000ML POUR BTL (IV SOLUTION) ×3 IMPLANT
OIL CARTRIDGE MAESTRO DRILL (MISCELLANEOUS) ×3
PACK LAMINECTOMY NEURO (CUSTOM PROCEDURE TRAY) ×3 IMPLANT
PAD ARMBOARD 7.5X6 YLW CONV (MISCELLANEOUS) ×9 IMPLANT
PATTIES SURGICAL .5 X1 (DISPOSABLE) IMPLANT
PATTIES SURGICAL 1X1 (DISPOSABLE) ×3 IMPLANT
PUTTY DBM 5CC CALC GRAN ×6 IMPLANT
ROD CURVED TI 6.35X45 (Rod) ×6 IMPLANT
SCREW PA DLX CREO 7.5X50 (Screw) ×12 IMPLANT
SPACER ALTERA 10X31 8-12MM-8 (Spacer) ×3 IMPLANT
SPONGE LAP 4X18 RFD (DISPOSABLE) IMPLANT
SPONGE NEURO XRAY DETECT 1X3 (DISPOSABLE) IMPLANT
SPONGE SURGIFOAM ABS GEL 100 (HEMOSTASIS) IMPLANT
STRIP CLOSURE SKIN 1/2X4 (GAUZE/BANDAGES/DRESSINGS) ×2 IMPLANT
SUT VIC AB 1 CT1 18XBRD ANBCTR (SUTURE) ×2 IMPLANT
SUT VIC AB 1 CT1 8-18 (SUTURE) ×4
SUT VIC AB 2-0 CP2 18 (SUTURE) ×6 IMPLANT
SYR 20ML LL LF (SYRINGE) IMPLANT
TOWEL GREEN STERILE (TOWEL DISPOSABLE) ×3 IMPLANT
TOWEL GREEN STERILE FF (TOWEL DISPOSABLE) ×3 IMPLANT
TRAY FOLEY MTR SLVR 16FR STAT (SET/KITS/TRAYS/PACK) ×3 IMPLANT
WATER STERILE IRR 1000ML POUR (IV SOLUTION) ×3 IMPLANT

## 2019-05-20 NOTE — Progress Notes (Signed)
I discussed with the patient about whether he has anyone that lives with him or that will stay with him when he gets discharged. Patient stated his daughter and grandchildren will be staying with him for awhile. Patient woke up in PACU Alert and oriented x 4 in PACU. Full sensation and movement in lower body.

## 2019-05-20 NOTE — Progress Notes (Signed)
Subjective: The patient is alert and pleasant.  He is in no apparent distress.  Objective: Vital signs in last 24 hours: Temp:  [98.3 F (36.8 C)] 98.3 F (36.8 C) (10/26 1501) Pulse Rate:  [60-65] 65 (10/26 1515) Resp:  [12-20] 17 (10/26 1515) BP: (142-158)/(65-80) 158/80 (10/26 1515) SpO2:  [98 %-99 %] 98 % (10/26 1515) Weight:  [75.3 kg] 75.3 kg (10/26 0928) Estimated body mass index is 23.82 kg/m as calculated from the following:   Height as of this encounter: 5\' 10"  (1.778 m).   Weight as of this encounter: 75.3 kg.   Intake/Output from previous day: No intake/output data recorded. Intake/Output this shift: Total I/O In: 1500 [I.V.:1400; IV Piggyback:100] Out: 505 [Urine:405; Blood:100]  Physical exam the patient is alert and pleasant.  He is moving his lower extremities well.  Lab Results: No results for input(s): WBC, HGB, HCT, PLT in the last 72 hours. BMET No results for input(s): NA, K, CL, CO2, GLUCOSE, BUN, CREATININE, CALCIUM in the last 72 hours.  Studies/Results: Dg Lumbar Spine 2-3 Views  Result Date: 05/20/2019 CLINICAL DATA:  Surgical posterior fusion of L3-4. EXAM: DG C-ARM 1-60 MIN; LUMBAR SPINE - 2-3 VIEW CONTRAST:  None. FLUOROSCOPY TIME:  Fluoroscopy Time:  13 seconds. Number of Acquired Spot Images: 2. COMPARISON:  MRI of May 13, 2019. FINDINGS: Two intraoperative fluoroscopic images were obtained of the lumbar spine. These images demonstrate the patient to be status post surgical posterior fusion of L3-4 with bilateral intrapedicular screw placement and interbody fusion. Good alignment of vertebral bodies is noted. IMPRESSION: Fluoroscopic guidance provided during surgical posterior fusion of L3-4. Electronically Signed   By: Marijo Conception M.D.   On: 05/20/2019 14:30   Dg C-arm 1-60 Min  Result Date: 05/20/2019 CLINICAL DATA:  Surgical posterior fusion of L3-4. EXAM: DG C-ARM 1-60 MIN; LUMBAR SPINE - 2-3 VIEW CONTRAST:  None. FLUOROSCOPY TIME:   Fluoroscopy Time:  13 seconds. Number of Acquired Spot Images: 2. COMPARISON:  MRI of May 13, 2019. FINDINGS: Two intraoperative fluoroscopic images were obtained of the lumbar spine. These images demonstrate the patient to be status post surgical posterior fusion of L3-4 with bilateral intrapedicular screw placement and interbody fusion. Good alignment of vertebral bodies is noted. IMPRESSION: Fluoroscopic guidance provided during surgical posterior fusion of L3-4. Electronically Signed   By: Marijo Conception M.D.   On: 05/20/2019 14:30    Assessment/Plan: The patient is doing well.  I spoke with the son.  LOS: 0 days     Ophelia Charter 05/20/2019, 3:35 PM

## 2019-05-20 NOTE — Op Note (Signed)
Brief history: The patient is an 83 year old white male who has complained of back and leg pain consistent with neurogenic claudication.  He has failed medical management.  He has been worked up with a lumbar MRI and lumbar x-rays which demonstrated an adult degenerative scoliosis, lumbar spondylolisthesis, lumbar spinal stenosis, etc.  I discussed the various treatment option with him.  He has decided to proceed with surgery.  Preoperative diagnosis: Adult degenerative scoliosis, lumbar spondylolisthesis, lumbar facet arthropathy, degenerative disc disease, spinal stenosis compressing both the L3 and the L4 nerve roots; lumbago; lumbar radiculopathy; neurogenic claudication  Postoperative diagnosis: The same  Procedure: Bilateral L3-4 laminotomy/foraminotomies/medial facetectomy to decompress the bilateral L3 and L4 nerve roots(the work required to do this was in addition to the work required to do the posterior lumbar interbody fusion because of the patient's spinal stenosis, facet arthropathy. Etc. requiring a wide decompression of the nerve roots.);  L3-4 transforaminal lumbar interbody fusion with local morselized autograft bone and Zimmer DBM; insertion of interbody prosthesis at L3-4 (globus peek expandable interbody prosthesis); posterior nonsegmental instrumentation from L3 to L4 with globus titanium pedicle screws and rods; posterior lateral arthrodesis at L3-4 with local morselized autograft bone and Zimmer DBM.  Surgeon: Dr. Earle Gell  Asst.: Dr. Ashok Pall and Arnetha Massy nurse practitioner  Anesthesia: Gen. endotracheal  Estimated blood loss: 200 cc  Drains: None  Complications: None  Description of procedure: The patient was brought to the operating room by the anesthesia team. General endotracheal anesthesia was induced. The patient was turned to the prone position on the Wilson frame. The patient's lumbosacral region was then prepared with Betadine scrub and Betadine  solution. Sterile drapes were applied.  I then injected the area to be incised with Marcaine with epinephrine solution. I then used the scalpel to make a linear midline incision over the L3-4 interspace. I then used electrocautery to perform a bilateral subperiosteal dissection exposing the spinous process and lamina of L3 and L4. We then obtained intraoperative radiograph to confirm our location. We then inserted the Verstrac retractor to provide exposure.  I began the decompression by using the high speed drill to perform laminotomies at L3-4 bilaterally. We then used the Kerrison punches to widen the laminotomy and removed the ligamentum flavum at L3-4 bilaterally. We used the Kerrison punches to remove the medial facets at L3-4 bilaterally, we removed the right L3 facet. We performed wide foraminotomies about the bilateral L3 and L4 nerve roots completing the decompression.  We now turned our attention to the posterior lumbar interbody fusion. I used a scalpel to incise the intervertebral disc at L3-4 bilaterally. I then performed a partial intervertebral discectomy at L3-4 bilaterally using the pituitary forceps. We prepared the vertebral endplates at [id number] bilaterally for the fusion by removing the soft tissues with the curettes. We then used the trial spacers to pick the appropriate sized interbody prosthesis. We prefilled his prosthesis with a combination of local morselized autograft bone that we obtained during the decompression as well as Zimmer DBM. We inserted the prefilled prosthesis into the interspace at L3-4 from the right, we then turned and expanded the prosthesis. There was a good snug fit of the prosthesis in the interspace. We then filled and the remainder of the intervertebral disc space with local morselized autograft bone and Zimmer DBM. This completed the posterior lumbar interbody arthrodesis.  We now turned attention to the instrumentation. Under fluoroscopic guidance we cannulated  the bilateral L3 and L4 pedicles with the bone probe.  We then removed the bone probe. We then tapped the pedicle with a 6.5 millimeter tap. We then removed the tap. We probed inside the tapped pedicle with a ball probe to rule out cortical breaches. We then inserted a 7.5 x 50 millimeter pedicle screw into the L3 and L4 pedicles bilaterally under fluoroscopic guidance. We then palpated along the medial aspect of the pedicles to rule out cortical breaches. There were none. The nerve roots were not injured. We then connected the unilateral pedicle screws with a lordotic rod. We compressed the construct and secured the rod in place with the caps. We then tightened the caps appropriately. This completed the instrumentation from L3-4 bilaterally.  We now turned our attention to the posterior lateral arthrodesis at L3-4. We used the high-speed drill to decorticate the remainder of the facets, pars, transverse process at L3-4. We then applied a combination of local morselized autograft bone and Zimmer DBM over these decorticated posterior lateral structures. This completed the posterior lateral arthrodesis.  We then obtained hemostasis using bipolar electrocautery. We irrigated the wound out with bacitracin solution. We inspected the thecal sac and nerve roots and noted they were well decompressed. We then removed the retractor.  We injected Exparel . We reapproximated patient's thoracolumbar fascia with interrupted #1 Vicryl suture. We reapproximated patient's subcutaneous tissue with interrupted 2-0 Vicryl suture. The reapproximated patient's skin with Steri-Strips and benzoin. The wound was then coated with bacitracin ointment. A sterile dressing was applied. The drapes were removed. The patient was subsequently returned to the supine position where they were extubated by the anesthesia team. He was then transported to the post anesthesia care unit in stable condition. All sponge instrument and needle counts were  reportedly correct at the end of this case.

## 2019-05-20 NOTE — Progress Notes (Signed)
[phone] Rico Weckesser is patients wife in [place] and would like to be called with an update

## 2019-05-20 NOTE — H&P (Signed)
Subjective: The patient is an 83 year old white male who has complained of back and leg pain consistent with neurogenic claudication.  He has failed medical management and was worked up with a lumbar MRI and lumbar x-rays.  This demonstrated an L3-4 spondylolisthesis, facet arthropathy and spinal stenosis.  I discussed the various treatment options with the patient including surgery.  He has decided to proceed with an L3-4 decompression, instrumentation and fusion after weighing the risks, benefits and alternatives.  We have obtained cardiac clearance.  Past Medical History:  Diagnosis Date  . Cancer ([place])    Lymphona treated with Chemo and radiation  . Coronary artery disease   . Diabetes mellitus without complication ([place])    Type II  . Dyslipidemia   . Gastric ulcer   . Hypertension   . Spinal stenosis   . Stroke ([place])    2013ish, left hand slightly weaker than Right      Allergies  Allergen Reactions  . Penicillins Rash    Did it involve swelling of the face/tongue/throat, SOB, or low BP? No Did it involve sudden or severe rash/hives, skin peeling, or any reaction on the inside of your mouth or nose? No Did you need to seek medical attention at a hospital or doctor's office? Yes When did it last happen?40 years ago If all above answers are "NO", may proceed with cephalosporin use.     Social History   Tobacco Use  . Smoking status: Former Smoker    Years: 20.00    Quit date: 1970    Years since quitting: 50.8  . Smokeless tobacco: Former Systems developer    Quit date: 1980  Substance Use Topics  . Alcohol use: Never    Frequency: Never    No family history on file. Prior to Admission medications   Medication Sig Start Date End Date Taking? Authorizing Provider  Ascorbic Acid (VITAMIN C) 1000 MG tablet Take 1,000 mg by mouth every other day.   Yes [provider]  atenolol (TENORMIN) 50 MG tablet Take 50 mg by mouth daily. 04/12/19  Yes [provider]  b  complex vitamins tablet Take 1 tablet by mouth daily.   Yes [provider]  bethanechol (URECHOLINE) 25 MG tablet Take 25 mg by mouth daily.  04/16/19  Yes [provider]  lisinopril-hydrochlorothiazide (ZESTORETIC) 20-12.5 MG tablet Take 1 tablet by mouth daily.  01/15/19  Yes [provider]  metFORMIN (GLUCOPHAGE) 500 MG tablet Take 500 mg by mouth 2 (two) times daily with a meal.   Yes [provider]  nitrofurantoin, macrocrystal-monohydrate, (MACROBID) 100 MG capsule Take 100 mg by mouth 2 (two) times daily.   Yes [provider]  rosuvastatin (CRESTOR) 5 MG tablet Take 5 mg by mouth daily.   Yes [provider]  tamsulosin (FLOMAX) 0.4 MG CAPS capsule Take 0.4 mg by mouth daily.  04/16/19  Yes [provider]  traMADol (ULTRAM) 50 MG tablet Take 50 mg by mouth every 6 (six) hours as needed for severe pain.   Yes [provider]  Vitamin D, Ergocalciferol, (DRISDOL) 1.25 MG (50000 UT) CAPS capsule Take 50,000 Units by mouth every 7 (seven) days.   Yes [provider]     Review of Systems  Positive ROS: As above  All other systems have been reviewed and were otherwise negative with the exception of those mentioned in the HPI and as above.  Objective: Vital signs in last 24 hours: Temp:  [98.3 F (36.8  C)] 98.3 F (36.8 C) (10/26 0928) Pulse Rate:  [61] 61 (10/26 0928) Resp:  [20] 20 (10/26 0928) BP: (150)/(74) 150/74 (10/26 0928) SpO2:  [98 %] 98 % (10/26 0928) Weight:  [75.3 kg] 75.3 kg (10/26 0928) Estimated body mass index is 23.82 kg/m as calculated from the following:   Height as of this encounter: 5\' 10"  (1.778 m).   Weight as of this encounter: 75.3 kg.   General Appearance: Alert Head: Normocephalic, without obvious abnormality, atraumatic Eyes: PERRL, conjunctiva/corneas clear, EOM's intact,    Ears: Normal  Throat: Normal  Neck: Supple, Back: unremarkable Lungs: Clear to  auscultation bilaterally, respirations unlabored Heart: Regular rate and rhythm, no murmur, rub or gallop Abdomen: Soft, non-tender Extremities: Extremities normal, atraumatic, no cyanosis or edema Skin: unremarkable  NEUROLOGIC:   Mental status: alert and oriented,Motor Exam - grossly normal Sensory Exam - grossly normal Reflexes:  Coordination - grossly normal Gait - grossly normal Balance - grossly normal Cranial Nerves: I: smell Not tested  II: visual acuity  OS: Normal  OD: Normal   II: visual fields Full to confrontation  II: pupils Equal, round, reactive to light  III,VII: ptosis None  III,IV,VI: extraocular muscles  Full ROM  V: mastication Normal  V: facial light touch sensation  Normal  V,VII: corneal reflex  Present  VII: facial muscle function - upper  Normal  VII: facial muscle function - lower Normal  VIII: hearing Not tested  IX: soft palate elevation  Normal  IX,X: gag reflex Present  XI: trapezius strength  5/5  XI: sternocleidomastoid strength 5/5  XI: neck flexion strength  5/5  XII: tongue strength  Normal    Data Review Lab Results  Component Value Date   WBC 7.1 05/16/2019   HGB 13.7 05/16/2019   HCT 42.1 05/16/2019   MCV 90.0 05/16/2019   PLT 312 05/16/2019   Lab Results  Component Value Date   NA 135 05/16/2019   K 4.1 05/16/2019   CL 100 05/16/2019   CO2 23 05/16/2019   BUN 23 05/16/2019   CREATININE 1.17 05/16/2019   GLUCOSE 98 05/16/2019   No results found for: INR, PROTIME  Assessment/Plan: L3-4 spondylolisthesis, spinal stenosis, facet arthropathy, lumbago, lumbar radiculopathy, neurogenic claudication: I have discussed the situation with the patient.  I reviewed his imaging studies with him and pointed out the abnormalities.  We have discussed the various treatment options including surgery.  I have described the surgical treatment option of an L3-4 decompression, instrumentation and fusion.  I have shown him surgical models.  I  have given him a surgical pamphlet.  We have discussed the risks, benefits, alternatives, expected postoperative course, and likelihood of achieving our goals with surgery.  I have answered all his questions.  He has decided to proceed with surgery.   Ophelia Charter 05/20/2019 10:36 AM

## 2019-05-20 NOTE — Anesthesia Procedure Notes (Signed)
Procedure Name: Intubation Date/Time: 05/20/2019 11:24 AM Performed by: Colin Benton, CRNA Pre-anesthesia Checklist: Patient identified, Emergency Drugs available, Suction available and Patient being monitored Patient Re-evaluated:Patient Re-evaluated prior to induction Oxygen Delivery Method: Circle system utilized Preoxygenation: Pre-oxygenation with 100% oxygen Induction Type: IV induction Ventilation: Mask ventilation without difficulty and Oral airway inserted - appropriate to patient size Laryngoscope Size: Miller and 3 Grade View: Grade I Tube type: Oral Tube size: 7.5 mm Number of attempts: 1 Airway Equipment and Method: Stylet Placement Confirmation: ETT inserted through vocal cords under direct vision,  positive ETCO2 and breath sounds checked- equal and bilateral Secured at: 22 cm Tube secured with: Tape Dental Injury: Teeth and Oropharynx as per pre-operative assessment

## 2019-05-20 NOTE — Transfer of Care (Signed)
Immediate Anesthesia Transfer of Care Note  Patient: David Bird  Procedure(s) Performed: POSTERIOR LUMBAR INTERBODY FUSION, POSTERIOR INSTRUMENTATION LUMBAR THREE- LUMBAR FOUR (N/A )  Patient Location: PACU  Anesthesia Type:General  Level of Consciousness: drowsy  Airway & Oxygen Therapy: Patient Spontanous Breathing and Patient connected to nasal cannula oxygen  Post-op Assessment: Report given to RN and Post -op Vital signs reviewed and stable  Post vital signs: Reviewed and stable  Last Vitals:  Vitals Value Taken Time  BP    Temp    Pulse 55 05/20/19 1501  Resp 12 05/20/19 1501  SpO2 99 % 05/20/19 1501  Vitals shown include unvalidated device data.  Last Pain:  Vitals:   05/20/19 1050  PainSc: 0-No pain         Complications: No apparent anesthesia complications

## 2019-05-21 LAB — CBC
HCT: 33.3 % — ABNORMAL LOW (ref 39.0–52.0)
Hemoglobin: 11.4 g/dL — ABNORMAL LOW (ref 13.0–17.0)
MCH: 29.9 pg (ref 26.0–34.0)
MCHC: 34.2 g/dL (ref 30.0–36.0)
MCV: 87.4 fL (ref 80.0–100.0)
Platelets: 236 10*3/uL (ref 150–400)
RBC: 3.81 MIL/uL — ABNORMAL LOW (ref 4.22–5.81)
RDW: 13 % (ref 11.5–15.5)
WBC: 11 10*3/uL — ABNORMAL HIGH (ref 4.0–10.5)
nRBC: 0 % (ref 0.0–0.2)

## 2019-05-21 LAB — BASIC METABOLIC PANEL
Anion gap: 10 (ref 5–15)
BUN: 21 mg/dL (ref 8–23)
CO2: 22 mmol/L (ref 22–32)
Calcium: 8.5 mg/dL — ABNORMAL LOW (ref 8.9–10.3)
Chloride: 100 mmol/L (ref 98–111)
Creatinine, Ser: 1.15 mg/dL (ref 0.61–1.24)
GFR calc Af Amer: >60 mL/min (ref >60)
GFR calc non Af Amer: 59 mL/min — ABNORMAL LOW (ref >60)
Glucose, Bld: 161 mg/dL — ABNORMAL HIGH (ref 70–99)
Potassium: 4.1 mmol/L (ref 3.5–5.1)
Sodium: 132 mmol/L — ABNORMAL LOW (ref 135–145)

## 2019-05-21 LAB — GLUCOSE, CAPILLARY
Glucose-Capillary: 183 mg/dL — ABNORMAL HIGH (ref 70–99)
Glucose-Capillary: 147 mg/dL — ABNORMAL HIGH (ref 70–99)
Glucose-Capillary: 139 mg/dL — ABNORMAL HIGH (ref 70–99)
Glucose-Capillary: 134 mg/dL — ABNORMAL HIGH (ref 70–99)

## 2019-05-21 NOTE — Evaluation (Signed)
Occupational Therapy Evaluation Patient Details Name: David Bird MRN: [id number] DOB: 01-19-1936 Today's Date: 05/21/2019    History of Present Illness Pt is an 83 y/o male who presents s/p L3-L4 TLIF on 05/20/2019. PMH significant for CVA, HTN, DMII, CAD s/p stent, CA, partial gastrectomy (~1960), R ankle fracture with plate fixation.    Clinical Impression   Patient is s/p TLIF L3-4 surgery resulting in functional limitations due to the deficits listed below (see OT problem list). Pt currently needs cues for adls with back precautions and demonstrates mild balance impairments.  Patient will benefit from skilled OT acutely to increase independence and safety with ADLS to allow discharge home.     Follow Up Recommendations  No OT follow up    Equipment Recommendations  None recommended by OT    Recommendations for Other Services       Precautions / Restrictions Precautions Precautions: Fall;Back Precaution Booklet Issued: Yes (comment) Precaution Comments: Reviewed handou for adls Required Braces or Orthoses: Spinal Brace Spinal Brace: Lumbar corset;Applied in sitting position Restrictions Weight Bearing Restrictions: No      Mobility Bed Mobility Overal bed mobility: Modified Independent             General bed mobility comments: up in chair ona rrival after PT session  Transfers Overall transfer level: Needs assistance Equipment used: None Transfers: Sit to/from Stand Sit to Stand: Supervision         General transfer comment: Guarded upon first stand with UE's in high guard position. No assist required but close supervision provided for safety.     Balance Overall balance assessment: Needs assistance Sitting-balance support: Feet supported;No upper extremity supported Sitting balance-Leahy Scale: Fair     Standing balance support: No upper extremity supported;During functional activity Standing balance-Leahy Scale: Fair                              ADL either performed or assessed with clinical judgement   ADL Overall ADL's : Needs assistance/impaired Eating/Feeding: Independent   Grooming: Supervision/safety   Upper Body Bathing: Supervision/ safety   Lower Body Bathing: Min guard;Cueing for back precautions   Upper Body Dressing : Supervision/safety   Lower Body Dressing: Min guard;Cueing for back precautions   Toilet Transfer: Supervision/safety           Functional mobility during ADLs: Supervision/safety General ADL Comments: pt reports that he touches objects in environment when walking because he wants to but he can walk without needing to do it. pt states he has a walker but states he doesnt need to use it. when suggested to use RW due to walking down the walks to reach bathroom door he states "no i dont need that"      Vision Baseline Vision/History: Wears glasses Wears Glasses: At all times       Perception     Praxis      Pertinent Vitals/Pain Pain Assessment: No/denies pain Faces Pain Scale: Hurts little more Pain Location: incision site Pain Descriptors / Indicators: Operative site guarding;Tender Pain Intervention(s): Limited activity within patient's tolerance;Monitored during session;Repositioned     Hand Dominance Right   Extremity/Trunk Assessment Upper Extremity Assessment Upper Extremity Assessment: Overall WFL for tasks assessed   Lower Extremity Assessment Lower Extremity Assessment: Generalized weakness(Consistent with pre-op diagnosis)   Cervical / Trunk Assessment Cervical / Trunk Assessment: Other exceptions Cervical / Trunk Exceptions: s/p surgery   Communication Communication Communication: No difficulties  Cognition Arousal/Alertness: Awake/alert Behavior During Therapy: WFL for tasks assessed/performed Overall Cognitive Status: Within Functional Limits for tasks assessed                                     General Comments        Exercises     Shoulder Instructions      Home Living Family/patient expects to be discharged to:: Private residence Living Arrangements: Alone Available Help at Discharge: Family;Available 24 hours/day Type of Home: House Home Access: Stairs to enter [employer] of Steps: 3 Entrance Stairs-Rails: Right;Left Home Layout: Two level;Able to live on main level with bedroom/bathroom Alternate Level Stairs-Number of Steps: flight   Bathroom Shower/Tub: Walk-in shower(at son's house - at his house walk in shower and walk in tub)   Bathroom Toilet: [place]: Environmental consultant - 2 wheels;Cane - single point;Shower seat   Additional Comments: Above information is for son's home where pt plans to go to first before returning home alone.  patient reports daughter and 3 granddaughters are coming to stay with him. pt states he has 19 grandchildren and 14 grandchildren in addition to his own children to help him.       Prior Functioning/Environment Level of Independence: Independent                 OT Problem List: Decreased strength;Decreased activity tolerance;Impaired balance (sitting and/or standing);Decreased knowledge of use of DME or AE;Decreased knowledge of precautions      OT Treatment/Interventions: Self-care/ADL training;Therapeutic exercise;Neuromuscular education;Energy conservation;DME and/or AE instruction;Manual therapy;Therapeutic activities;Cognitive remediation/compensation;Patient/family education;Balance training    OT Goals(Current goals can be found in the care plan section) Acute Rehab OT Goals Patient Stated Goal: to go home tomorrow  OT Goal Formulation: With patient Time For Goal Achievement: 06/04/19 Potential to Achieve Goals: Good  OT Frequency: Min 2X/week   Barriers to D/C:            Co-evaluation              AM-PAC OT "6 Clicks" Daily Activity     Outcome Measure Help from another person eating meals?: None Help  from another person taking care of personal grooming?: A Little Help from another person toileting, which includes using toliet, bedpan, or urinal?: A Little Help from another person bathing (including washing, rinsing, drying)?: A Little Help from another person to put on and taking off regular upper body clothing?: A Little Help from another person to put on and taking off regular lower body clothing?: A Little 6 Click Score: 19   End of Session Equipment Utilized During Treatment: Back brace Nurse Communication: Mobility status;Precautions  Activity Tolerance: Patient tolerated treatment well Patient left: in chair;with call bell/phone within reach  OT Visit Diagnosis: Unsteadiness on feet (R26.81);Muscle weakness (generalized) (M62.81)                Time: [id number] OT Time Calculation (min): 17 min Charges:  OT General Charges $OT Visit: 1 Visit OT Evaluation $OT Eval Low Complexity: 1 Low   Jeri Modena, OTR/L  Acute Rehabilitation Services Pager: [phone] Office: [phone] .   Jeri Modena 05/21/2019, 2:03 PM

## 2019-05-21 NOTE — Progress Notes (Signed)
Subjective: The patient is alert and pleasant.  He is in no apparent distress.  He looks well.  He is contemplating going home later today.  Objective: Vital signs in last 24 hours: Temp:  [97.7 F (36.5 C)-98.3 F (36.8 C)] 98 F (36.7 C) (10/27 0425) Pulse Rate:  [60-79] 75 (10/27 0425) Resp:  [12-20] 18 (10/27 0425) BP: (122-169)/(65-92) 122/73 (10/27 0425) SpO2:  [93 %-100 %] 93 % (10/27 0425) Weight:  [75.3 kg] 75.3 kg (10/26 0928) Estimated body mass index is 23.82 kg/m as calculated from the following:   Height as of this encounter: 5\' 10"  (1.778 m).   Weight as of this encounter: 75.3 kg.   Intake/Output from previous day: 10/26 0701 - 10/27 0700 In: 1500 [I.V.:1400; IV Piggyback:100] Out: 630 [Urine:530; Blood:100] Intake/Output this shift: No intake/output data recorded.  Physical exam the patient is alert and oriented.  He is moving his lower extremities well.  Lab Results: No results for input(s): WBC, HGB, HCT, PLT in the last 72 hours. BMET No results for input(s): NA, K, CL, CO2, GLUCOSE, BUN, CREATININE, CALCIUM in the last 72 hours.  Studies/Results: Dg Lumbar Spine 2-3 Views  Result Date: 05/20/2019 CLINICAL DATA:  Surgical posterior fusion of L3-4. EXAM: DG C-ARM 1-60 MIN; LUMBAR SPINE - 2-3 VIEW CONTRAST:  None. FLUOROSCOPY TIME:  Fluoroscopy Time:  13 seconds. Number of Acquired Spot Images: 2. COMPARISON:  MRI of May 13, 2019. FINDINGS: Two intraoperative fluoroscopic images were obtained of the lumbar spine. These images demonstrate the patient to be status post surgical posterior fusion of L3-4 with bilateral intrapedicular screw placement and interbody fusion. Good alignment of vertebral bodies is noted. IMPRESSION: Fluoroscopic guidance provided during surgical posterior fusion of L3-4. Electronically Signed   By: Marijo Conception M.D.   On: 05/20/2019 14:30   Dg Lumbar Spine 1 View  Result Date: 05/20/2019 CLINICAL DATA:  Lumbar localization  image EXAM: LUMBAR SPINE - 1 VIEW COMPARISON:  Lumbar spine radiograph 04/16/2019, lumbar MRI 05/13/2019 FINDINGS: Surgical implement is directed towards the lamina of L5 at the level of the L4-5 interspace. Multilevel discogenic and facet degenerative changes are noted throughout the abdomen. Extensive post surgical material is noted in the upper abdomen. Vascular calcium is present in the aorta. IMPRESSION: Surgical implement directed towards the lamina of L5 at the L4-5 interspace. Electronically Signed   By: Lovena Le M.D.   On: 05/20/2019 16:37   Dg C-arm 1-60 Min  Result Date: 05/20/2019 CLINICAL DATA:  Surgical posterior fusion of L3-4. EXAM: DG C-ARM 1-60 MIN; LUMBAR SPINE - 2-3 VIEW CONTRAST:  None. FLUOROSCOPY TIME:  Fluoroscopy Time:  13 seconds. Number of Acquired Spot Images: 2. COMPARISON:  MRI of May 13, 2019. FINDINGS: Two intraoperative fluoroscopic images were obtained of the lumbar spine. These images demonstrate the patient to be status post surgical posterior fusion of L3-4 with bilateral intrapedicular screw placement and interbody fusion. Good alignment of vertebral bodies is noted. IMPRESSION: Fluoroscopic guidance provided during surgical posterior fusion of L3-4. Electronically Signed   By: Marijo Conception M.D.   On: 05/20/2019 14:30    Assessment/Plan: Postop day #1: The patient is doing well.  A CBC and BMP is pending.  He may go home later on today.  I gave him his discharge instructions and answered all his questions.  LOS: 1 day     Ophelia Charter 05/21/2019, 7:28 AM

## 2019-05-21 NOTE — Anesthesia Postprocedure Evaluation (Signed)
Anesthesia Post Note  Patient: David Bird  Procedure(s) Performed: POSTERIOR LUMBAR INTERBODY FUSION, POSTERIOR INSTRUMENTATION LUMBAR THREE- LUMBAR FOUR (N/A )     Patient location during evaluation: PACU Anesthesia Type: General Level of consciousness: awake and alert Pain management: pain level controlled Vital Signs Assessment: post-procedure vital signs reviewed and stable Respiratory status: spontaneous breathing, nonlabored ventilation, respiratory function stable and patient connected to nasal cannula oxygen Cardiovascular status: blood pressure returned to baseline and stable Postop Assessment: no apparent nausea or vomiting Anesthetic complications: no    Last Vitals:  Vitals:   05/21/19 0425 05/21/19 0739  BP: 122/73 127/72  Pulse: 75 68  Resp: 18 16  Temp: 36.7 C 36.8 C  SpO2: 93% 98%    Last Pain:  Vitals:   05/21/19 0739  TempSrc: Oral  PainSc:                  [place] S

## 2019-05-21 NOTE — Evaluation (Signed)
Physical Therapy Evaluation and Discharge Patient Details Name: David Bird MRN: [id number] DOB: 1935/08/01 Today's Date: 05/21/2019   History of Present Illness  Pt is an 83 y/o male who presents s/p L3-L4 TLIF on 05/20/2019. PMH significant for CVA, HTN, DMII, CAD s/p stent, CA, partial gastrectomy (~1960), R ankle fracture with plate fixation.   Clinical Impression  Patient evaluated by Physical Therapy with no further acute PT needs identified. All education has been completed and the patient has no further questions. Pt was able to demonstrate transfers and ambulation with gross supervision and 1 instance of min guard assist due to mild unsteadiness. Overall pt will have 24 hour supervision from family at d/c and feel he is safe to return home with family's assistance. Pt was educated on precautions, brace application/wearing schedule, appropriate activity progression, and car transfer. See below for any follow-up Physical Therapy or equipment needs. PT is signing off. Thank you for this referral.     Follow Up Recommendations No PT follow up;Supervision for mobility/OOB    Equipment Recommendations  None recommended by PT    Recommendations for Other Services       Precautions / Restrictions Precautions Precautions: Fall;Back Precaution Booklet Issued: Yes (comment) Precaution Comments: Reviewed handout and pt was cued for precautions during functional mobility.  Required Braces or Orthoses: Spinal Brace Spinal Brace: Lumbar corset;Applied in sitting position Restrictions Weight Bearing Restrictions: No      Mobility  Bed Mobility Overal bed mobility: Modified Independent             General bed mobility comments: HOB flat and rails lowered to simulate home environment.   Transfers Overall transfer level: Needs assistance Equipment used: None Transfers: Sit to/from Stand Sit to Stand: Supervision         General transfer comment: Guarded upon first  stand with UE's in high guard position. No assist required but close supervision provided for safety.   Ambulation/Gait Ambulation/Gait assistance: Supervision;Min guard Gait Distance (Feet): 200 Feet Assistive device: None Gait Pattern/deviations: Step-through pattern;Decreased stride length;Trunk flexed Gait velocity: Decreased Gait velocity interpretation: <1.8 ft/sec, indicate of risk for recurrent falls General Gait Details: pt requesting to hold to the railing in hall for support. Was able to ambulate without UE support but reports feeling more comfortable with rail. Discussed use of SPC at home. Pt with 1 very mild bout of unsteadiness requiring min guard assist but otherwise was at a supervision level in hall both with and without railing use.   Stairs Stairs: Yes Stairs assistance: Min guard Stair Management: One rail Right;Step to pattern;Forwards Number of Stairs: 5 General stair comments: VC's for sequencing and general safety. No assist required.   Wheelchair Mobility    Modified Rankin (Stroke Patients Only)       Balance Overall balance assessment: Needs assistance Sitting-balance support: Feet supported;No upper extremity supported Sitting balance-Leahy Scale: Fair     Standing balance support: No upper extremity supported;During functional activity Standing balance-Leahy Scale: Fair                               Pertinent Vitals/Pain Pain Assessment: Faces Faces Pain Scale: Hurts little more Pain Location: incision site Pain Descriptors / Indicators: Operative site guarding;Tender Pain Intervention(s): Limited activity within patient's tolerance;Monitored during session;Repositioned    Home Living Family/patient expects to be discharged to:: Private residence Living Arrangements: Alone Available Help at Discharge: Family;Available 24 hours/day Type of Home: House  Home Access: Stairs to enter Entrance Stairs-Rails: Right;Left Entrance  Stairs-Number of Steps: 3 Home Layout: Two level;Able to live on main level with bedroom/bathroom Home Equipment: Gilford Rile - 2 wheels;Cane - single point;Shower seat Additional Comments: Above information is for son's home where pt plans to go to first before returning home alone.     Prior Function Level of Independence: Independent               Hand Dominance        Extremity/Trunk Assessment   Upper Extremity Assessment Upper Extremity Assessment: Defer to OT evaluation    Lower Extremity Assessment Lower Extremity Assessment: Generalized weakness(Consistent with pre-op diagnosis)    Cervical / Trunk Assessment Cervical / Trunk Assessment: Other exceptions Cervical / Trunk Exceptions: s/p surgery  Communication   Communication: No difficulties  Cognition Arousal/Alertness: Awake/alert Behavior During Therapy: WFL for tasks assessed/performed Overall Cognitive Status: Within Functional Limits for tasks assessed                                        General Comments      Exercises     Assessment/Plan    PT Assessment Patent does not need any further PT services  PT Problem List         PT Treatment Interventions DME instruction;Gait training;Functional mobility training;Therapeutic activities;Therapeutic exercise;Neuromuscular re-education;Patient/family education    PT Goals (Current goals can be found in the Care Plan section)  Acute Rehab PT Goals Patient Stated Goal: Home today PT Goal Formulation: All assessment and education complete, DC therapy    Frequency Min 5X/week   Barriers to discharge        Co-evaluation               AM-PAC PT "6 Clicks" Mobility  Outcome Measure Help needed turning from your back to your side while in a flat bed without using bedrails?: None Help needed moving from lying on your back to sitting on the side of a flat bed without using bedrails?: None Help needed moving to and from a bed to  a chair (including a wheelchair)?: None Help needed standing up from a chair using your arms (e.g., wheelchair or bedside chair)?: None Help needed to walk in hospital room?: A Little Help needed climbing 3-5 steps with a railing? : A Little 6 Click Score: 22    End of Session Equipment Utilized During Treatment: Back brace;Gait belt Activity Tolerance: Patient tolerated treatment well Patient left: in chair;with call bell/phone within reach Nurse Communication: Mobility status PT Visit Diagnosis: Unsteadiness on feet (R26.81);Pain Pain - part of body: (back)    Time: [id number] PT Time Calculation (min) (ACUTE ONLY): 23 min   Charges:   PT Evaluation $PT Eval Low Complexity: 1 Low PT Treatments $Gait Training: 8-22 mins        Rolinda Roan, PT, DPT Acute Rehabilitation Services Pager: [phone] Office: [phone]   [name] 05/21/2019, 10:32 AM

## 2019-05-22 LAB — GLUCOSE, CAPILLARY: Glucose-Capillary: 115 mg/dL — ABNORMAL HIGH (ref 70–99)

## 2019-05-22 MED ORDER — CYCLOBENZAPRINE HCL 10 MG PO TABS: 10.0000 mg | ORAL_TABLET | Freq: Three times a day (TID) | ORAL | 0 refills | Status: DC | PRN

## 2019-05-22 MED ORDER — DOCUSATE SODIUM 100 MG PO CAPS: 100.0000 mg | ORAL_CAPSULE | Freq: Two times a day (BID) | ORAL | 0 refills | Status: DC

## 2019-05-22 MED ORDER — OXYCODONE HCL 5 MG PO TABS: 5.0000 mg | ORAL_TABLET | ORAL | 0 refills | Status: DC | PRN

## 2019-05-22 NOTE — Discharge Instructions (Signed)
Call MD for: Difficulty breathing, headache or visual disturbances, extreme fatigue  °Call MD for: hives ° Call MD for: persistant dizziness or light-headedness ° Call MD for: persistant nausea and vomiting  °Call MD for: redness, tenderness, or signs of infection (pain, swelling, redness, odor or green/yellow discharge around incision site)  °Call MD for: severe uncontrolled pain  °Call MD for: temperature >100.4 Diet - low sodium heart healthy Discharge instructions  ° Call [phone] for a followup appointment. °  °Increase activity slowly Remove dressing in 48 hours  ° ° ° °Spinal Fusion, Adult, Care After °This sheet gives you information about how to care for yourself after your procedure. Your doctor may also give you more specific instructions. If you have problems or questions, contact your doctor. °Follow these instructions at home: °Medicines °· Take over-the-counter and prescription medicines only as told by your doctor. These include any medicines for pain or blood-thinning medicines (anticoagulants). °· If you were prescribed an antibiotic medicine, take it as told by your doctor. Do not stop taking the antibiotic even if you start to feel better. °· Do not drive for 24 hours if you were given a medicine to help you relax (sedative) during your procedure. °· Do not drive or use heavy machinery while taking prescription pain medicine. °If you have a brace: °· Wear the brace as told by your doctor. Take it off only as told by your doctor. °· Keep the brace clean. °Managing pain, stiffness, and swelling °· If directed, put ice on the surgery area: °? If you have a removable brace, take it off as told by your doctor. °? Put ice in a plastic bag. °? Place a towel between your skin and the bag. °? Leave the ice on for 20 minutes, 2-3 times a day. °Surgery cut care ° °· Follow instructions from your doctor about how to take care of your cut from surgery (incision). Make sure you: °? Wash your hands with  soap and water before you change your bandage (dressing). If you cannot use soap and water, use hand sanitizer. °? Change your bandage as told by your doctor. °? Leave stitches (sutures), skin glue, or skin tape (adhesive) strips in place. They may need to stay in place for 2 weeks or longer. If tape strips get loose and curl up, you may trim the loose edges. Do not remove tape strips completely unless your doctor says it is okay. °· Keep your cut from surgery clean and dry. °? Do not take baths, swim, or use a hot tub until your doctor says it is okay. °? Ask your doctor if you can take showers. You may only be allowed to take sponge baths. °· Every day, check your cut from surgery and the area around it for: °? More redness, swelling, or pain. °? Fluid or blood. °? Warmth. °? Pus or a bad smell. °· If you have a drain tube, follow instructions from your doctor about caring for it. Do not take out the drain tube or any bandages unless your doctor says it is okay. °Physical activity °· Rest and protect your back as much as possible. °· Follow instructions from your doctor about how to move. Use good posture to help your spine heal. °· Do not lift anything that is heavier than 8 lb (3.6 kg), or the limit that you are told, until your doctor says that it is safe. °· Do not twist or bend at the waist until your doctor says it   is okay.  It is best if you: ? Do not make pushing and pulling motions. ? Do not sit or lie down in the same position for a long time. ? Do not raise your hands or arms above your head.  Return to your normal activities as told by your doctor. Ask your doctor what activities are safe for you. Rest and protect your back as much as you can.  Do not start to exercise until your doctor says it is okay. Ask your doctor what kinds of exercise you can do to make your back stronger. General instructions  To prevent blood clots and lessen swelling in your legs: ? Wear compression stockings as  told. ? Walk one or more times every few hours as told by your doctor.  Do not use any products that contain nicotine or tobacco, such as cigarettes and e-cigarettes. These can delay bone healing. If you need help quitting, ask your doctor.  To prevent or treat constipation while you are taking prescription pain medicine, your doctor may suggest that you: ? Drink enough fluid to keep your pee (urine) pale yellow. ? Take over-the-counter or prescription medicines. ? Eat foods that are high in fiber. These include fresh fruits and vegetables, whole grains, and beans. ? Limit foods that are high in fat and processed sugars, such as fried and sweet foods.  Keep all follow-up visits as told by your doctor. This is important. Contact a doctor if:  Your pain gets worse.  Your medicine does not help your pain.  Your legs or feet get painful or swollen.  Your cut from surgery is more red, swollen, or painful.  Your cut from surgery feels warm to the touch.  You have: ? Fluid or blood coming from your cut from surgery. ? Pus or a bad smell coming from your cut from surgery. ? A fever. ? Weakness or loss of feeling (numbness) in your legs that is new or getting worse. ? Trouble controlling when you pee (urinate) or poop (have a bowel movement).  You feel sick to your stomach (nauseous).  You throw up (vomit). Get help right away if:  Your pain is very bad.  You have chest pain.  You have trouble breathing.  You start to have a cough. These symptoms may be an emergency. Do not wait to see if the symptoms will go away. Get medical help right away. Call your local emergency services (911 in the [place] Do not drive yourself to the hospital. Summary  After the procedure, it is common to have pain in your back and pain by your surgery cut(s).  Icing and pain medicines may help to control the pain. Follow directions from your doctor.  Rest and protect your back as much as possible. Do  not twist or bend at the waist.  Get up and walk one or more times every few hours as told by your doctor. This information is not intended to replace advice given to you by your health care provider. Make sure you discuss any questions you have with your health care provider. Document Released: 11/04/2010 Document Revised: 11/01/2018 Document Reviewed: 10/25/2016 Elsevier Patient Education  [employer].

## 2019-05-22 NOTE — Discharge Summary (Signed)
Physician Discharge Summary  Patient ID: David Bird MRN: [id number] DOB/AGE: 03/13/1936 83 y.o.  Admit date: 05/20/2019 Discharge date: 05/22/2019  Admission Diagnoses: L3-4 spondylolisthesis, facet arthropathy, spinal stenosis, degenerative disc disease, lumbago, lumbar radiculopathy, neurogenic claudication  Discharge Diagnoses: The same Active Problems:   Spondylolisthesis, lumbar region   Discharged Condition: good  Hospital Course: I performed an L3-4 decompression, instrumentation and fusion on the patient on 05/20/2019.  The surgery went well.  The patient's postoperative course was unremarkable.  On postoperative day #1 he requested discharge to home.  He was given written and oral discharge instructions.  All his questions were answered.  Consults: PT, OT, care management Significant Diagnostic Studies: None Treatments: L3-4 decompression, instrumentation and fusion Discharge Exam: Blood pressure (!) 102/50, pulse 70, temperature 98.3 F (36.8 C), temperature source Oral, resp. rate 16, height 5\' 10"  (1.778 m), weight 75.3 kg, SpO2 95 %. The patient is alert and pleasant.  His dressing is clean and dry.  His lower extremity strength is normal.  Disposition: Home  Discharge Instructions    Call MD for:  difficulty breathing, headache or visual disturbances   Complete by: As directed    Call MD for:  extreme fatigue   Complete by: As directed    Call MD for:  hives   Complete by: As directed    Call MD for:  persistant dizziness or light-headedness   Complete by: As directed    Call MD for:  persistant nausea and vomiting   Complete by: As directed    Call MD for:  redness, tenderness, or signs of infection (pain, swelling, redness, odor or green/yellow discharge around incision site)   Complete by: As directed    Call MD for:  severe uncontrolled pain   Complete by: As directed    Call MD for:  temperature >100.4   Complete by: As directed    Diet - low  sodium heart healthy   Complete by: As directed    Discharge instructions   Complete by: As directed    Call [phone] for a followup appointment. Take a stool softener while you are using pain medications.   Driving Restrictions   Complete by: As directed    Do not drive for 2 weeks.   Increase activity slowly   Complete by: As directed    Lifting restrictions   Complete by: As directed    Do not lift more than 5 pounds. No excessive bending or twisting.   May shower / Bathe   Complete by: As directed    Remove the dressing for 3 days after surgery.  You may shower, but leave the incision alone.   Remove dressing in 24 hours   Complete by: As directed      Allergies as of 05/22/2019      Reactions   Penicillins Rash   Did it involve swelling of the face/tongue/throat, SOB, or low BP? No Did it involve sudden or severe rash/hives, skin peeling, or any reaction on the inside of your mouth or nose? No Did you need to seek medical attention at a hospital or doctor's office? Yes When did it last happen?40 years ago If all above answers are "NO", may proceed with cephalosporin use.      Medication List    TAKE these medications   atenolol 50 MG tablet Commonly known as: TENORMIN Take 50 mg by mouth daily.   b complex vitamins tablet Take 1 tablet by mouth daily.   bethanechol  25 MG tablet Commonly known as: URECHOLINE Take 25 mg by mouth daily.   cyclobenzaprine 10 MG tablet Commonly known as: FLEXERIL Take 1 tablet (10 mg total) by mouth 3 (three) times daily as needed for muscle spasms.   docusate sodium 100 MG capsule Commonly known as: COLACE Take 1 capsule (100 mg total) by mouth 2 (two) times daily.   lisinopril-hydrochlorothiazide 20-12.5 MG tablet Commonly known as: ZESTORETIC Take 1 tablet by mouth daily.   metFORMIN 500 MG tablet Commonly known as: GLUCOPHAGE Take 500 mg by mouth 2 (two) times daily with a meal.   nitrofurantoin  (macrocrystal-monohydrate) 100 MG capsule Commonly known as: MACROBID Take 100 mg by mouth 2 (two) times daily.   oxyCODONE 5 MG immediate release tablet Commonly known as: Oxy IR/ROXICODONE Take 1 tablet (5 mg total) by mouth every 3 (three) hours as needed for moderate pain ((score 4 to 6)).   rosuvastatin 5 MG tablet Commonly known as: CRESTOR Take 5 mg by mouth daily.   tamsulosin 0.4 MG Caps capsule Commonly known as: FLOMAX Take 0.4 mg by mouth daily.   traMADol 50 MG tablet Commonly known as: ULTRAM Take 50 mg by mouth every 6 (six) hours as needed for severe pain.   vitamin C 1000 MG tablet Take 1,000 mg by mouth every other day.   Vitamin D (Ergocalciferol) 1.25 MG (50000 UT) Caps capsule Commonly known as: DRISDOL Take 50,000 Units by mouth every 7 (seven) days.        Signed: Ophelia Charter 05/22/2019, 7:45 AM

## 2019-05-22 NOTE — Progress Notes (Signed)
Occupational Therapy Treatment Patient Details Name: David Bird MRN: [id number] DOB: Aug 15, 1935 Today's Date: 05/22/2019    History of present illness Pt is an 83 y/o male who presents s/p L3-L4 TLIF on 05/20/2019. PMH significant for CVA, HTN, DMII, CAD s/p stent, CA, partial gastrectomy (~1960), R ankle fracture with plate fixation.    OT comments  Pt is adequate level to d/c home but will need close supervision of family to cue for precautions. Pt verbalizes precautions but does follow them without cues during functional task. Pt is at risk for breaking bending precautions throughout the day . Encouraged patient to use reacher and family (A).  Pt states his son is suppose to get him a RW and reacher today.    Follow Up Recommendations  No OT follow up    Equipment Recommendations  None recommended by OT    Recommendations for Other Services      Precautions / Restrictions Precautions Precautions: Fall;Back Precaution Comments: reviewed back precautions with adl Required Braces or Orthoses: Spinal Brace Spinal Brace: Lumbar corset Restrictions Weight Bearing Restrictions: No       Mobility Bed Mobility Overal bed mobility: Modified Independent                Transfers Overall transfer level: Needs assistance   Transfers: Sit to/from Stand Sit to Stand: Supervision              Balance                                           ADL either performed or assessed with clinical judgement   ADL Overall ADL's : Needs assistance/impaired                 Upper Body Dressing : Supervision/safety;Sitting   Lower Body Dressing: Min guard;Adhering to back precautions;Sit to/from stand Lower Body Dressing Details (indicate cue type and reason): pt is able to loop underwear over feet with cues for bending. pt needed encouragement to allow OT in the room due to patient being modest. OT turning to help keep patient privacy Toilet  Transfer: Supervision/safety           Functional mobility during ADLs: Supervision/safety General ADL Comments: pt walking hands along environmental supports. could benefit from [employer]     Praxis      Cognition Arousal/Alertness: Awake/alert Behavior During Therapy: WFL for tasks assessed/performed Overall Cognitive Status: Within Functional Limits for tasks assessed                                          Exercises     Shoulder Instructions       General Comments      Pertinent Vitals/ Pain       Pain Assessment: Faces Faces Pain Scale: Hurts little more Pain Location: incicion Pain Descriptors / Indicators: Operative site guarding Pain Intervention(s): Repositioned;Patient requesting pain meds-RN notified  Home Living                                          Prior Functioning/Environment  Frequency  Min 2X/week        Progress Toward Goals  OT Goals(current goals can now be found in the care plan section)  Progress towards OT goals: Progressing toward goals  Acute Rehab OT Goals Patient Stated Goal: call son during session to come get hime OT Goal Formulation: With patient Time For Goal Achievement: 06/04/19 Potential to Achieve Goals: Good ADL Goals Pt Will Transfer to Toilet: with modified independence Pt Will Perform Tub/Shower Transfer: Shower transfer;with modified independence Additional ADL Goal #1: pt will complete adl with 100% back precautons  Plan Discharge plan remains appropriate    Co-evaluation                 AM-PAC OT "6 Clicks" Daily Activity     Outcome Measure   Help from another person eating meals?: None Help from another person taking care of personal grooming?: A Little Help from another person toileting, which includes using toliet, bedpan, or urinal?: A Little Help from another person bathing (including washing, rinsing, drying)?:  A Little Help from another person to put on and taking off regular upper body clothing?: A Little Help from another person to put on and taking off regular lower body clothing?: A Little 6 Click Score: 19    End of Session Equipment Utilized During Treatment: Back brace  OT Visit Diagnosis: Unsteadiness on feet (R26.81);Muscle weakness (generalized) (M62.81)   Activity Tolerance Patient tolerated treatment well   Patient Left in chair;with call bell/phone within reach   Nurse Communication Mobility status;Precautions        Time: [id number] OT Time Calculation (min): 14 min  Charges: OT General Charges $OT Visit: 1 Visit OT Treatments $Self Care/Home Management : 8-22 mins   Jeri Modena, OTR/L  Acute Rehabilitation Services Pager: [phone] Office: [phone] .    Jeri Modena 05/22/2019, 11:44 AM

## 2019-05-22 NOTE — Progress Notes (Signed)
PT doing well. Pt and son given D/C instructions with verbal understanding. Rx's were sent to pharmacy by MD. Pt's incision is clean and dry with no sign of infection. Pt's IV was removed prior to D/C. Pt D/C'd home via wheelchair per MD order. Pt is stable @ D/C and has no other needs at this time. Holli Humbles, RN

## 2019-05-23 MED FILL — Heparin Sodium (Porcine) Inj 1000 Unit/ML: INTRAMUSCULAR | Qty: 30 | Status: AC

## 2019-05-23 MED FILL — Sodium Chloride IV Soln 0.9%: INTRAVENOUS | Qty: 1000 | Status: AC

## 2019-06-07 DIAGNOSIS — R339 Retention of urine, unspecified: Secondary | ICD-10-CM | POA: Diagnosis not present

## 2019-06-07 DIAGNOSIS — M4316 Spondylolisthesis, lumbar region: Secondary | ICD-10-CM | POA: Diagnosis not present

## 2019-06-09 ENCOUNTER — Other Ambulatory Visit: Payer: Self-pay

## 2019-06-09 ENCOUNTER — Emergency Department (HOSPITAL_COMMUNITY): Payer: Medicare Other

## 2019-06-09 ENCOUNTER — Encounter (HOSPITAL_COMMUNITY): Payer: Self-pay | Admitting: Emergency Medicine

## 2019-06-09 ENCOUNTER — Inpatient Hospital Stay (HOSPITAL_COMMUNITY)
Admission: EM | Admit: 2019-06-09 | Discharge: 2019-06-19 | DRG: 872 | Disposition: A | Discharge status: Home Health | Payer: Medicare Other | Attending: Internal Medicine | Admitting: Internal Medicine

## 2019-06-09 DIAGNOSIS — I440 Atrioventricular block, first degree: Secondary | ICD-10-CM | POA: Diagnosis present

## 2019-06-09 DIAGNOSIS — B9561 Methicillin susceptible Staphylococcus aureus infection as the cause of diseases classified elsewhere: Secondary | ICD-10-CM | POA: Diagnosis not present

## 2019-06-09 DIAGNOSIS — R531 Weakness: Secondary | ICD-10-CM | POA: Diagnosis not present

## 2019-06-09 DIAGNOSIS — Y838 Other surgical procedures as the cause of abnormal reaction of the patient, or of later complication, without mention of misadventure at the time of the procedure: Secondary | ICD-10-CM | POA: Diagnosis present

## 2019-06-09 DIAGNOSIS — E222 Syndrome of inappropriate secretion of antidiuretic hormone: Secondary | ICD-10-CM | POA: Diagnosis not present

## 2019-06-09 DIAGNOSIS — Z955 Presence of coronary angioplasty implant and graft: Secondary | ICD-10-CM

## 2019-06-09 DIAGNOSIS — Z20828 Contact with and (suspected) exposure to other viral communicable diseases: Secondary | ICD-10-CM | POA: Diagnosis present

## 2019-06-09 DIAGNOSIS — Z79891 Long term (current) use of opiate analgesic: Secondary | ICD-10-CM

## 2019-06-09 DIAGNOSIS — R7401 Elevation of levels of liver transaminase levels: Secondary | ICD-10-CM | POA: Diagnosis present

## 2019-06-09 DIAGNOSIS — Z66 Do not resuscitate: Secondary | ICD-10-CM | POA: Diagnosis present

## 2019-06-09 DIAGNOSIS — E861 Hypovolemia: Secondary | ICD-10-CM | POA: Diagnosis present

## 2019-06-09 DIAGNOSIS — E871 Hypo-osmolality and hyponatremia: Secondary | ICD-10-CM | POA: Diagnosis not present

## 2019-06-09 DIAGNOSIS — I2510 Atherosclerotic heart disease of native coronary artery without angina pectoris: Secondary | ICD-10-CM | POA: Diagnosis present

## 2019-06-09 DIAGNOSIS — I10 Essential (primary) hypertension: Secondary | ICD-10-CM

## 2019-06-09 DIAGNOSIS — Z923 Personal history of irradiation: Secondary | ICD-10-CM

## 2019-06-09 DIAGNOSIS — D649 Anemia, unspecified: Secondary | ICD-10-CM

## 2019-06-09 DIAGNOSIS — N179 Acute kidney failure, unspecified: Secondary | ICD-10-CM | POA: Diagnosis not present

## 2019-06-09 DIAGNOSIS — R7881 Bacteremia: Principal | ICD-10-CM | POA: Diagnosis present

## 2019-06-09 DIAGNOSIS — K5900 Constipation, unspecified: Secondary | ICD-10-CM | POA: Diagnosis not present

## 2019-06-09 DIAGNOSIS — E872 Acidosis: Secondary | ICD-10-CM | POA: Diagnosis not present

## 2019-06-09 DIAGNOSIS — T8149XA Infection following a procedure, other surgical site, initial encounter: Secondary | ICD-10-CM | POA: Diagnosis not present

## 2019-06-09 DIAGNOSIS — E119 Type 2 diabetes mellitus without complications: Secondary | ICD-10-CM | POA: Diagnosis present

## 2019-06-09 DIAGNOSIS — I361 Nonrheumatic tricuspid (valve) insufficiency: Secondary | ICD-10-CM | POA: Diagnosis not present

## 2019-06-09 DIAGNOSIS — Z79899 Other long term (current) drug therapy: Secondary | ICD-10-CM

## 2019-06-09 DIAGNOSIS — E860 Dehydration: Secondary | ICD-10-CM | POA: Diagnosis not present

## 2019-06-09 DIAGNOSIS — E1122 Type 2 diabetes mellitus with diabetic chronic kidney disease: Secondary | ICD-10-CM | POA: Diagnosis not present

## 2019-06-09 DIAGNOSIS — Z880 Allergy status to penicillin: Secondary | ICD-10-CM

## 2019-06-09 DIAGNOSIS — R338 Other retention of urine: Secondary | ICD-10-CM | POA: Diagnosis not present

## 2019-06-09 DIAGNOSIS — E785 Hyperlipidemia, unspecified: Secondary | ICD-10-CM | POA: Diagnosis present

## 2019-06-09 DIAGNOSIS — M48061 Spinal stenosis, lumbar region without neurogenic claudication: Secondary | ICD-10-CM | POA: Diagnosis not present

## 2019-06-09 DIAGNOSIS — N401 Enlarged prostate with lower urinary tract symptoms: Secondary | ICD-10-CM

## 2019-06-09 DIAGNOSIS — D638 Anemia in other chronic diseases classified elsewhere: Secondary | ICD-10-CM | POA: Diagnosis present

## 2019-06-09 DIAGNOSIS — Z7984 Long term (current) use of oral hypoglycemic drugs: Secondary | ICD-10-CM

## 2019-06-09 DIAGNOSIS — Z981 Arthrodesis status: Secondary | ICD-10-CM | POA: Diagnosis not present

## 2019-06-09 DIAGNOSIS — T8131XA Disruption of external operation (surgical) wound, not elsewhere classified, initial encounter: Secondary | ICD-10-CM | POA: Diagnosis not present

## 2019-06-09 DIAGNOSIS — D509 Iron deficiency anemia, unspecified: Secondary | ICD-10-CM | POA: Diagnosis not present

## 2019-06-09 DIAGNOSIS — I340 Nonrheumatic mitral (valve) insufficiency: Secondary | ICD-10-CM | POA: Diagnosis not present

## 2019-06-09 DIAGNOSIS — Z8673 Personal history of transient ischemic attack (TIA), and cerebral infarction without residual deficits: Secondary | ICD-10-CM

## 2019-06-09 DIAGNOSIS — Z8711 Personal history of peptic ulcer disease: Secondary | ICD-10-CM | POA: Diagnosis not present

## 2019-06-09 DIAGNOSIS — N400 Enlarged prostate without lower urinary tract symptoms: Secondary | ICD-10-CM | POA: Diagnosis present

## 2019-06-09 DIAGNOSIS — Z8572 Personal history of non-Hodgkin lymphomas: Secondary | ICD-10-CM

## 2019-06-09 DIAGNOSIS — Z87440 Personal history of urinary (tract) infections: Secondary | ICD-10-CM

## 2019-06-09 DIAGNOSIS — Z9221 Personal history of antineoplastic chemotherapy: Secondary | ICD-10-CM

## 2019-06-09 DIAGNOSIS — Z87891 Personal history of nicotine dependence: Secondary | ICD-10-CM

## 2019-06-09 DIAGNOSIS — B9689 Other specified bacterial agents as the cause of diseases classified elsewhere: Secondary | ICD-10-CM | POA: Diagnosis not present

## 2019-06-09 LAB — COMPREHENSIVE METABOLIC PANEL
ALT: 45 U/L — ABNORMAL HIGH (ref 0–44)
AST: 58 U/L — ABNORMAL HIGH (ref 15–41)
Albumin: 3 g/dL — ABNORMAL LOW (ref 3.5–5.0)
Alkaline Phosphatase: 93 U/L (ref 38–126)
Anion gap: 17 — ABNORMAL HIGH (ref 5–15)
BUN: 20 mg/dL (ref 8–23)
CO2: 19 mmol/L — ABNORMAL LOW (ref 22–32)
Calcium: 8.6 mg/dL — ABNORMAL LOW (ref 8.9–10.3)
Chloride: 81 mmol/L — ABNORMAL LOW (ref 98–111)
Creatinine, Ser: 1.22 mg/dL (ref 0.61–1.24)
GFR calc Af Amer: >60 mL/min (ref >60)
GFR calc non Af Amer: 55 mL/min — ABNORMAL LOW (ref >60)
Glucose, Bld: 205 mg/dL — ABNORMAL HIGH (ref 70–99)
Potassium: 4.3 mmol/L (ref 3.5–5.1)
Sodium: 117 mmol/L — CL (ref 135–145)
Total Bilirubin: 0.6 mg/dL (ref 0.3–1.2)
Total Protein: 6.6 g/dL (ref 6.5–8.1)

## 2019-06-09 LAB — SARS CORONAVIRUS 2 (TAT 6-24 HRS): SARS Coronavirus 2: NEGATIVE

## 2019-06-09 LAB — URINALYSIS, ROUTINE W REFLEX MICROSCOPIC
Bilirubin Urine: NEGATIVE
Glucose, UA: NEGATIVE mg/dL
Hgb urine dipstick: NEGATIVE
Ketones, ur: NEGATIVE mg/dL
Leukocytes,Ua: NEGATIVE
Nitrite: NEGATIVE
Protein, ur: NEGATIVE mg/dL
Specific Gravity, Urine: 1.006 (ref 1.005–1.030)
pH: 6 (ref 5.0–8.0)

## 2019-06-09 LAB — BASIC METABOLIC PANEL
Anion gap: 14 (ref 5–15)
BUN: 18 mg/dL (ref 8–23)
CO2: 21 mmol/L — ABNORMAL LOW (ref 22–32)
Calcium: 8.6 mg/dL — ABNORMAL LOW (ref 8.9–10.3)
Chloride: 87 mmol/L — ABNORMAL LOW (ref 98–111)
Creatinine, Ser: 1 mg/dL (ref 0.61–1.24)
GFR calc Af Amer: >60 mL/min (ref >60)
GFR calc non Af Amer: >60 mL/min (ref >60)
Glucose, Bld: 178 mg/dL — ABNORMAL HIGH (ref 70–99)
Potassium: 3.7 mmol/L (ref 3.5–5.1)
Sodium: 122 mmol/L — ABNORMAL LOW (ref 135–145)

## 2019-06-09 LAB — OSMOLALITY, URINE: Osmolality, Ur: 223 mOsm/kg — ABNORMAL LOW (ref 300–900)

## 2019-06-09 LAB — CBC
HCT: 32.1 % — ABNORMAL LOW (ref 39.0–52.0)
Hemoglobin: 11.1 g/dL — ABNORMAL LOW (ref 13.0–17.0)
MCH: 29.6 pg (ref 26.0–34.0)
MCHC: 34.6 g/dL (ref 30.0–36.0)
MCV: 85.6 fL (ref 80.0–100.0)
Platelets: 292 10*3/uL (ref 150–400)
RBC: 3.75 MIL/uL — ABNORMAL LOW (ref 4.22–5.81)
RDW: 13.2 % (ref 11.5–15.5)
WBC: 7.3 10*3/uL (ref 4.0–10.5)
nRBC: 0 % (ref 0.0–0.2)

## 2019-06-09 LAB — IRON AND TIBC
Iron: 14 ug/dL — ABNORMAL LOW (ref 45–182)
Saturation Ratios: 6 % — ABNORMAL LOW (ref 17.9–39.5)
TIBC: 224 ug/dL — ABNORMAL LOW (ref 250–450)
UIBC: 210 ug/dL

## 2019-06-09 LAB — LIPASE, BLOOD: Lipase: 26 U/L (ref 11–51)

## 2019-06-09 LAB — SODIUM, URINE, RANDOM: Sodium, Ur: 23 mmol/L

## 2019-06-09 LAB — FERRITIN: Ferritin: 852 ng/mL — ABNORMAL HIGH (ref 24–336)

## 2019-06-09 LAB — VITAMIN B12: Vitamin B-12: 305 pg/mL (ref 180–914)

## 2019-06-09 LAB — CREATININE, URINE, RANDOM: Creatinine, Urine: 44.78 mg/dL

## 2019-06-09 MED ORDER — SODIUM CHLORIDE 0.9 % IV BOLUS
500.0000 mL | Freq: Once | INTRAVENOUS | Status: AC
  Administered 2019-06-09: 500 mL via INTRAVENOUS

## 2019-06-09 MED ORDER — TAMSULOSIN HCL 0.4 MG PO CAPS
0.4000 mg | ORAL_CAPSULE | Freq: Every day | ORAL | Status: DC
  Administered 2019-06-10 – 2019-06-18 (×9): 0.4 mg via ORAL
  Filled 2019-06-09 (×9): qty 1

## 2019-06-09 MED ORDER — SODIUM CHLORIDE 0.9% FLUSH: 3.0000 mL | Freq: Once | INTRAVENOUS | Status: DC

## 2019-06-09 MED ORDER — BETHANECHOL CHLORIDE 25 MG PO TABS
25.0000 mg | ORAL_TABLET | Freq: Every day | ORAL | Status: DC
  Administered 2019-06-10 – 2019-06-19 (×10): 25 mg via ORAL
  Filled 2019-06-09 (×10): qty 1

## 2019-06-09 MED ORDER — ROSUVASTATIN CALCIUM 5 MG PO TABS
5.0000 mg | ORAL_TABLET | Freq: Every day | ORAL | Status: DC
  Administered 2019-06-10 – 2019-06-19 (×10): 5 mg via ORAL
  Filled 2019-06-09 (×10): qty 1

## 2019-06-09 MED ORDER — SODIUM CHLORIDE 0.9 % IV BOLUS
1000.0000 mL | Freq: Once | INTRAVENOUS | Status: AC
  Administered 2019-06-09: 1000 mL via INTRAVENOUS

## 2019-06-09 MED ORDER — ATENOLOL 50 MG PO TABS
50.0000 mg | ORAL_TABLET | Freq: Every day | ORAL | Status: DC
  Administered 2019-06-10 – 2019-06-19 (×10): 50 mg via ORAL
  Filled 2019-06-09 (×10): qty 1

## 2019-06-09 MED ORDER — DOCUSATE SODIUM 100 MG PO CAPS
100.0000 mg | ORAL_CAPSULE | Freq: Two times a day (BID) | ORAL | Status: DC
  Administered 2019-06-09 – 2019-06-19 (×20): 100 mg via ORAL
  Filled 2019-06-09 (×20): qty 1

## 2019-06-09 MED ORDER — ENOXAPARIN SODIUM 40 MG/0.4ML ~~LOC~~ SOLN
40.0000 mg | SUBCUTANEOUS | Status: DC
  Administered 2019-06-09 – 2019-06-18 (×10): 40 mg via SUBCUTANEOUS
  Filled 2019-06-09 (×10): qty 0.4

## 2019-06-09 NOTE — ED Provider Notes (Signed)
[facility] EMERGENCY DEPARTMENT Provider Note   CSN: [id number] Arrival date & time: 06/09/19  1542     History   Chief Complaint Chief Complaint  Patient presents with  . Weakness  . decreased appetite    HPI David Bird is a 83 y.o. male.     HPI Patient presents with his son who assists with the HPI. Patient is generally well, lives by himself, was in his usual state of health until a planned neurosurgical procedure October 26. He notes that he had L3, 4 decompression and fusion.  Transiently he was doing better after the surgery, but in the last days, possibly week, he has had increasing anorexia, weakness. Weakness is progressive, with minimal capacity to ambulate. No nausea, no abdominal pain, no chest pain, no dyspnea. Patient has seen his neurosurgeon, and his urologist, who he sees for BPH, within the past week, with reassuring report. During this progression of weakness, with assistance of his son, he has attempted to be mindful of medication, minimizing any potential narcotics, using Tylenol for back pain. However, no clear relief with anything of his weakness, anorexia.   Past Medical History:  Diagnosis Date  . Cancer ([place])    Lymphona treated with Chemo and radiation  . Coronary artery disease   . Diabetes mellitus without complication ([place])    Type II  . Dyslipidemia   . Gastric ulcer   . Hypertension   . Spinal stenosis   . Spondylolisthesis   . Stroke [facility])    2013ish, left hand slightly weaker than Right    Patient Active Problem List   Diagnosis Date Noted  . Spondylolisthesis, lumbar region 05/20/2019    Past Surgical History:  Procedure Laterality Date  . ANKLE SURGERY Right    right ankel fracture- has a plate  . CARDIAC CATHETERIZATION    . CORONARY ANGIOPLASTY     stent 1, unsure of date.  [name] Kitchen NECK EXPLORATION    . partial gastrectomy  1960 ish        Home Medications    Prior to Admission  medications   Medication Sig Start Date End Date Taking? Authorizing Provider  Ascorbic Acid (VITAMIN C) 1000 MG tablet Take 1,000 mg by mouth every other day.    [provider]  atenolol (TENORMIN) 50 MG tablet Take 50 mg by mouth daily. 04/12/19   [provider]  b complex vitamins tablet Take 1 tablet by mouth daily.    [provider]  bethanechol (URECHOLINE) 25 MG tablet Take 25 mg by mouth daily.  04/16/19   [provider]  cyclobenzaprine (FLEXERIL) 10 MG tablet Take 1 tablet (10 mg total) by mouth 3 (three) times daily as needed for muscle spasms. 05/22/19   Newman Pies, MD  docusate sodium (COLACE) 100 MG capsule Take 1 capsule (100 mg total) by mouth 2 (two) times daily. 05/22/19   Newman Pies, MD  lisinopril-hydrochlorothiazide (ZESTORETIC) 20-12.5 MG tablet Take 1 tablet by mouth daily.  01/15/19   [provider]  metFORMIN (GLUCOPHAGE) 500 MG tablet Take 500 mg by mouth 2 (two) times daily with a meal.    [provider]  nitrofurantoin, macrocrystal-monohydrate, (MACROBID) 100 MG capsule Take 100 mg by mouth 2 (two) times daily.    [provider]  oxyCODONE (OXY IR/ROXICODONE) 5 MG immediate release tablet Take 1 tablet (5 mg total) by mouth every 3 (three) hours as needed for moderate pain ((score 4 to 6)). 05/22/19  Newman Pies, MD  rosuvastatin (CRESTOR) 5 MG tablet Take 5 mg by mouth daily.    [provider]  tamsulosin (FLOMAX) 0.4 MG CAPS capsule Take 0.4 mg by mouth daily.  04/16/19   [provider]  traMADol (ULTRAM) 50 MG tablet Take 50 mg by mouth every 6 (six) hours as needed for severe pain.    [provider]  Vitamin D, Ergocalciferol, (DRISDOL) 1.25 MG (50000 UT) CAPS capsule Take 50,000 Units by mouth every 7 (seven) days.    [provider]    Family History No family history on file.  Social History Social History   Tobacco Use  . Smoking  status: Former Smoker    Years: 20.00    Quit date: 1970    Years since quitting: 50.9  . Smokeless tobacco: Former Systems developer    Quit date: 1980  Substance Use Topics  . Alcohol use: Never    Frequency: Never  . Drug use: Never     Allergies   Penicillins   Review of Systems Review of Systems  Constitutional:       Per HPI, otherwise negative  HENT:       Per HPI, otherwise negative  Respiratory:       Per HPI, otherwise negative  Cardiovascular:       Per HPI, otherwise negative  Gastrointestinal: Negative for vomiting.  Endocrine:       Negative aside from HPI  Genitourinary:       Neg aside from HPI   Musculoskeletal:       Per HPI, otherwise negative  Skin: Negative.   Neurological: Positive for weakness. Negative for syncope.     Physical Exam Updated Vital Signs BP 106/63   Pulse 68   Temp 99.8 F (37.7 C) (Oral)   Resp 19   SpO2 95%   Physical Exam Vitals signs and nursing note reviewed.  Constitutional:      General: He is not in acute distress.    Appearance: He is well-developed.  HENT:     Head: Normocephalic and atraumatic.  Eyes:     Conjunctiva/sclera: Conjunctivae normal.  Cardiovascular:     Rate and Rhythm: Normal rate and regular rhythm.  Pulmonary:     Effort: Pulmonary effort is normal. No respiratory distress.     Breath sounds: No stridor.  Abdominal:     General: There is no distension.     Tenderness: There is no abdominal tenderness. There is no guarding.  Musculoskeletal:       Arms:  Skin:    General: Skin is warm and dry.  Neurological:     General: No focal deficit present.     Mental Status: He is alert and oriented to person, place, and time.     Motor: Atrophy present.     Comments: No focal deficit, bilateral plantar reflexes appropriate, atrophy, appropriate for age.      ED Treatments / Results  Labs (all labs ordered are listed, but only abnormal results are displayed) Labs Reviewed  CBC - Abnormal;  Notable for the following components:      Result Value   RBC 3.75 (*)    Hemoglobin 11.1 (*)    HCT 32.1 (*)    All other components within normal limits  COMPREHENSIVE METABOLIC PANEL - Abnormal; Notable for the following components:   Sodium 117 (*)    Chloride 81 (*)    CO2 19 (*)    Glucose, Bld 205 (*)  Calcium 8.6 (*)    Albumin 3.0 (*)    AST 58 (*)    ALT 45 (*)    GFR calc non Af Amer 55 (*)    Anion gap 17 (*)    All other components within normal limits  SARS CORONAVIRUS 2 (TAT 6-24 HRS)  LIPASE, BLOOD  URINALYSIS, ROUTINE W REFLEX MICROSCOPIC    EKG EKG Interpretation  Date/Time:  Sunday June 09 2019 15:56:55 EST Ventricular Rate:  76 PR Interval:  206 QRS Duration: 94 QT Interval:  386 QTC Calculation: 434 R Axis:   80 Text Interpretation: Normal sinus rhythm Normal ECG Confirmed by Carmin Muskrat [phone]) on 06/09/2019 3:59:27 PM   Radiology Dg Chest Port 1 View  Result Date: 06/09/2019 CLINICAL DATA:  Weakness EXAM: PORTABLE CHEST 1 VIEW COMPARISON:  02/18/2012 FINDINGS: The heart size and mediastinal contours are within normal limits. Both lungs are clear. Aortic atherosclerosis. The visualized skeletal structures are unremarkable. IMPRESSION: No active disease. Electronically Signed   By: Donavan Foil M.D.   On: 06/09/2019 16:48    Procedures Procedures (including critical care time)  CRITICAL CARE Performed by: Carmin Muskrat Total critical care time: 35 minutes Critical care time was exclusive of separately billable procedures and treating other patients. Critical care was necessary to treat or prevent imminent or life-threatening deterioration. Critical care was time spent personally by me on the following activities: development of treatment plan with patient and/or surrogate as well as nursing, discussions with consultants, evaluation of patient's response to treatment, examination of patient, obtaining history from patient or  surrogate, ordering and performing treatments and interventions, ordering and review of laboratory studies, ordering and review of radiographic studies, pulse oximetry and re-evaluation of patient's condition.   Medications Ordered in ED Medications  sodium chloride flush (NS) 0.9 % injection 3 mL (has no administration in time range)  sodium chloride 0.9 % bolus 1,000 mL (has no administration in time range)  sodium chloride 0.9 % bolus 500 mL (500 mLs Intravenous New Bag/Given 06/09/19 1648)     Initial Impression / Assessment and Plan / ED Course  I have reviewed the triage vital signs and the nursing notes.  Pertinent labs & imaging results that were available during my care of the patient were reviewed by me and considered in my medical decision making (see chart for details).    Chart review after initial visit notable for documentation of his recent procedure, with discharge diagnoses as below: Admission Diagnoses: L3-4 spondylolisthesis, facet arthropathy, spinal stenosis, degenerative disc disease, lumbago, lumbar radiculopathy, neurogenic claudication      5:19 PM Initial labs notable for sodium 117. Empirically, soon after over the patient began receiving IV fluids with consideration of dehydration. Now with confirmation of critical electrolyte abnormalities, he will continue to receive fluid resuscitation, additional labs pending. Patient and son aware of all findings thus far.  On repeat exam patient is in similar condition, remaining results unremarkable compared to initial results concerning for critical abnormal hyponatremia.  This elderly male presents almost 2 weeks after elective surgery, now with weakness, anorexia, nausea, but no vomiting, no fever, no cough. Patient is found to have critically abnormal sodium value, likely secondary to diminished intake, requiring admission, initiation of fluid resuscitation, judiciously.  COVID test pending, though he offers  little e/o this disease   Final Clinical Impressions(s) / ED Diagnoses   Final diagnoses:  Hyponatremia  Weakness     Carmin Muskrat, MD 06/09/19 1749

## 2019-06-09 NOTE — Consult Note (Signed)
Renal Service Consult Note [facility]  David Bird 06/09/2019 Sol Blazing Requesting Physician:  Dr Ileene Musa  Reason for Consult:  Hyponatremia HPI: The patient is a 83 y.o. year-old with hx of HTN, spinal stenosis, CVA, HL, DM2, hx lymphoma, CAD h/o stent x 1 had back surgery on 10/26 and was staying at his son's house and doing well. Then went back to his own house and 3 days ago began having generalized weakness and poor appetite. Saw his surgeon on Friday and was told he was dehydrated. Presented to ED , brought by his son, having difficutly walking now and still has no appetite.  In ED labs showed Na 117 (132 two wks ago), K 4.3 BUN 20, creat 1.22, CO2 19.  Alb 3.0, LFT"s slightly up, Tbili wnl and WBC 7k Hb 11 and plts ok.  CXR negative. BP's normal in ED 140/ 75, HR 60's and RR 15. Patient got 1.5 L NS bolus in ED.  Asked to see for hyponatremia.    Pt seen in room, says he feels "100 % better" and he is "never gonna let that happen again", referring to the 2.5 days he stayed in bed alone after he left his son's house to return to his own.    Repeat bmet is being drawn now.   ROS  denies CP  no joint pain   no HA  no blurry vision  no rash  no diarrhea  no nausea/ vomiting  no dysuria  no difficulty voiding  no change in urine color    Past Medical History  Past Medical History:  Diagnosis Date  . Cancer ([place])    Lymphona treated with Chemo and radiation  . Coronary artery disease   . Diabetes mellitus without complication ([place])    Type II  . Dyslipidemia   . Gastric ulcer   . Hypertension   . Spinal stenosis   . Spondylolisthesis   . Stroke [facility])    2013ish, left hand slightly weaker than Right   Past Surgical History  Past Surgical History:  Procedure Laterality Date  . ANKLE SURGERY Right    right ankel fracture- has a plate  . CARDIAC CATHETERIZATION    . CORONARY ANGIOPLASTY     stent 1, unsure of date.  [name] Kitchen NECK EXPLORATION     . partial gastrectomy  1960 ish   Family History No family history on file. Social History  reports that he quit smoking about 50 years ago. He quit after 20.00 years of use. He quit smokeless tobacco use about 40 years ago. He reports that he does not drink alcohol or use drugs. Allergies  Allergies  Allergen Reactions  . Penicillins Rash    Did it involve swelling of the face/tongue/throat, SOB, or low BP? No Did it involve sudden or severe rash/hives, skin peeling, or any reaction on the inside of your mouth or nose? No Did you need to seek medical attention at a hospital or doctor's office? Yes When did it last happen?40 years ago If all above answers are "NO", may proceed with cephalosporin use.    Home medications Prior to Admission medications   Medication Sig Start Date End Date Taking? Authorizing Provider  Acetaminophen (TYLENOL PO) Take 2 tablets by mouth 2 (two) times daily as needed (pain).   Yes [provider]  Ascorbic Acid (VITAMIN C) 1000 MG tablet Take 1,000 mg by mouth every other day.   Yes [provider]  atenolol (  TENORMIN) 50 MG tablet Take 50 mg by mouth daily. 04/12/19  Yes [provider]  bethanechol (URECHOLINE) 25 MG tablet Take 25 mg by mouth daily.  04/16/19  Yes [provider]  cephALEXin (KEFLEX) 500 MG capsule Take 500 mg by mouth every 6 (six) hours. 06/07/19  Yes [provider]  docusate sodium (COLACE) 100 MG capsule Take 1 capsule (100 mg total) by mouth 2 (two) times daily. 05/22/19  Yes Newman Pies, MD  lisinopril-hydrochlorothiazide (ZESTORETIC) 20-12.5 MG tablet Take 1.5 tablets by mouth daily.  01/15/19  Yes [provider]  metFORMIN (GLUCOPHAGE-XR) 500 MG 24 hr tablet Take 500 mg by mouth 2 (two) times daily as needed (CBG >150).    Yes [provider]  nitrofurantoin, macrocrystal-monohydrate, (MACROBID) 100 MG capsule Take 100 mg by mouth 2 (two) times daily.   Yes  [provider]  oxyCODONE (OXY IR/ROXICODONE) 5 MG immediate release tablet Take 1 tablet (5 mg total) by mouth every 3 (three) hours as needed for moderate pain ((score 4 to 6)). Patient taking differently: Take 2.5-5 mg by mouth every 3 (three) hours as needed for moderate pain ((score 4 to 6)).  05/22/19  Yes Newman Pies, MD  rosuvastatin (CRESTOR) 5 MG tablet Take 5 mg by mouth daily.   Yes [provider]  tamsulosin (FLOMAX) 0.4 MG CAPS capsule Take 0.4 mg by mouth daily after supper.  04/16/19  Yes [provider]  cyclobenzaprine (FLEXERIL) 10 MG tablet Take 1 tablet (10 mg total) by mouth 3 (three) times daily as needed for muscle spasms. Patient not taking: Reported on 06/09/2019 05/22/19   Newman Pies, MD   Liver Function Tests Recent Labs  Lab 06/09/19 1557  AST 58*  ALT 45*  ALKPHOS 93  BILITOT 0.6  PROT 6.6  ALBUMIN 3.0*   Recent Labs  Lab 06/09/19 1557  LIPASE 26   CBC Recent Labs  Lab 06/09/19 1557  WBC 7.3  HGB 11.1*  HCT 32.1*  MCV 85.6  PLT 123456   Basic Metabolic Panel Recent Labs  Lab 06/09/19 1557  NA 117*  K 4.3  CL 81*  CO2 19*  GLUCOSE 205*  BUN 20  CREATININE 1.22  CALCIUM 8.6*   Iron/TIBC/Ferritin/ %Sat No results found for: IRON, TIBC, FERRITIN, IRONPCTSAT  Vitals:   06/09/19 1546 06/09/19 1700  BP: 140/75 106/63  Pulse: 80 68  Resp: 19 19  Temp: 99.8 F (37.7 C)   TempSrc: Oral   SpO2: 97% 95%    Exam Gen alert, no distress No rash, cyanosis or gangrene Sclera anicteric, throat clear and moist  No jvd or bruits Chest clear bilat to bases, no rales or wheezing RRR no MRG Abd soft ntnd no mass or ascites +bs GU normal male MS no joint effusions or deformity Ext no LE or UE edema, no wounds or ulcers Neuro is alert, Ox 3 , nf    Home meds:  - atenolol 50 / lisinopril-hydrochlorothiazide 20-12.5 qd  - rosuvastatin 5 qd  - metformin 500 bid   - cephalexin 500 qid/ nitrofurantoin  100 bid  - oxycodone IR prn/ cyclobenzaprine prn/ bethanechol prn  - tamsulosin 0.4  - prn's/ vitamins/ supplements    Na 117  (Na was 132 on 05/21/19)   K+ 4.3  CO2 19  BUN 20 (21 on 10/27)  Creat 1.22  (1.15 on 10/27)         Assessment/ Plan: 1. Hyponatremia - in pt w/ prob dehydration  after recent back surgery 2 wks ago.  BP's and HR are stable , awaiting the next Na+ level after IVF boluses in the ED this afternoon.  Pt feels much better which would suggest that hypovolemia if the cause and if so all he needs is more normal saline. Is on HCTZ which can cause euvolemic hyponatremia, or potentially may worsen hypovolemic hyponatremia, not sure. Would hold until situation resolved. Hold ACEi as well which can make it hard to recover from low Na+.  Will wait to see what next Na+ level is and will make recommendations thereafter.   2. HTN - avoid acei/ thiazide for now, any other agents ok 3. SP recent lumbar decompression surg 4. HL 5. DM on oral agents      Kelly Splinter  MD 06/09/2019, 7:19 PM

## 2019-06-09 NOTE — ED Triage Notes (Signed)
Pt had back surgery on 10/26.  He was staying at son's house and doing well.  Went back to his house and started having generalized weakness on Thursday and decreased appetite.  Son states pt had pain medication at his house that he was unaware of and thinks he may have took extra medication.  Pt concerned that eating will cause constipation so he hasn't been eating much.  Seen by surgeon and urologist on Friday and told he was dehydrated.

## 2019-06-09 NOTE — Progress Notes (Signed)
New Admission Note:  Arrival Method: Stretcher Mental Orientation: Alert and oriented x 3-4 Telemetry: Box 1 NSR Assessment: Completed Skin: Warm and dry IV: NSL  Pain: Denies  Tubes: N/A Safety Measures: Safety Fall Prevention Plan initiated.  Admission: Completed 5 M  Orientation: Patient has been orientated to the room, unit and the staff. Welcome booklet given.  Family: Son  Orders have been reviewed and implemented. Will continue to monitor the patient. Call light has been placed within reach and bed alarm has been activated.   Sima Matas BSN, RN  Phone Number: [phone]

## 2019-06-09 NOTE — H&P (Signed)
History and Physical    David Bird [id number] DOB: [birth year] DOA: 06/09/2019  PCP: Ocie Doyne., MD  Patient coming from: Home  I have personally briefly reviewed patient's old medical records in [place]  Chief Complaint: weakness and decrease appetite  HPI: David Bird is a 83 y.o. male with medical history significant of history of lymphoma, CAD, CVA, hypertension, hyperlipidemia, neurogenic claudication s/p L3-4 decompression in October who presents with concerns of decreased appetite and weakness.  Son at bedside provides most the history.  He reports that patient recently had back surgery about 2 weeks ago and was living with him for 2 weeks and was otherwise well.  Patient then returned back to living on his own and son called him 2 days ago and realized he had been laying in bed for 2 days due to weakness.  He then saw urology for follow-up of his BPH 2 days ago and reportedly had lab work that showed dehydration but no UTI.  Then Friday night he saw his neurosurgeon for follow-up and reportedly had no issues with surgical site. No worsening back or leg pain since surgery. Son brought him back to his house and attempted to give him more food and fluid but decided to bring him in since he was showing some mild confusion and increase pallor.  He also notes that patient has been having decreased urinary output more than usual despite being treated on bethanechol by urology.  Patient notes that he had dysuria about 3 to 4 days ago but this has since resolved.  Denies any fever.  Denies any nausea, vomiting, diarrhea or abdominal pain. No family hx related to current symptom that would be contributory.   ED Course: He had temperature of 99.8 and was normotensive on room air. CBC showed no leukocytosis but had anemia of 11.1 which was not seen 3 weeks ago. Sodium of 117, glucose of 205, creatinine normal at 1.22, AST of 58 and ALT of 45. Anion gap of 17.  Chest XR  negative.  EKG shows first degree AV block.   Review of Systems:  Constitutional: No Weight Change, No Fever ENT/Mouth: No sore throat, No Rhinorrhea Eyes: No Eye Pain, No Vision Changes Cardiovascular: No Chest Pain, no SOB Respiratory: No Cough, No Sputum, No Wheezing, no Dyspnea  Gastrointestinal: No Nausea, No Vomiting, No Diarrhea, No Constipation, No Pain Genitourinary: no Urinary Incontinence, No Urgency, No Flank Pain Musculoskeletal: No Arthralgias, No Myalgias Skin: No Skin Lesions, No Pruritus, Neuro: + Weakness, No Numbness,  No Loss of Consciousness, No Syncope Psych: No Anxiety/Panic, No Depression, no decrease appetite Heme/Lymph: No Bruising, No Bleeding  Past Medical History:  Diagnosis Date  . Cancer ([place])    Lymphona treated with Chemo and radiation  . Coronary artery disease   . Diabetes mellitus without complication ([place])    Type II  . Dyslipidemia   . Gastric ulcer   . Hypertension   . Spinal stenosis   . Spondylolisthesis   . Stroke [facility])    2013ish, left hand slightly weaker than Right    Past Surgical History:  Procedure Laterality Date  . ANKLE SURGERY Right    right ankel fracture- has a plate  . CARDIAC CATHETERIZATION    . CORONARY ANGIOPLASTY     stent 1, unsure of date.  [name] NECK EXPLORATION    . partial gastrectomy  1960 ish     reports that he quit smoking about 50 years ago. He quit  after 20.00 years of use. He quit smokeless tobacco use about 40 years ago. He reports that he does not drink alcohol or use drugs.  Allergies  Allergen Reactions  . Penicillins Rash    Did it involve swelling of the face/tongue/throat, SOB, or low BP? No Did it involve sudden or severe rash/hives, skin peeling, or any reaction on the inside of your mouth or nose? No Did you need to seek medical attention at a hospital or doctor's office? Yes When did it last happen?40 years ago If all above answers are "NO", may proceed with cephalosporin use.        Prior to Admission medications   Medication Sig Start Date End Date Taking? Authorizing Provider  Ascorbic Acid (VITAMIN C) 1000 MG tablet Take 1,000 mg by mouth every other day.    [provider]  atenolol (TENORMIN) 50 MG tablet Take 50 mg by mouth daily. 04/12/19   [provider]  b complex vitamins tablet Take 1 tablet by mouth daily.    [provider]  bethanechol (URECHOLINE) 25 MG tablet Take 25 mg by mouth daily.  04/16/19   [provider]  cyclobenzaprine (FLEXERIL) 10 MG tablet Take 1 tablet (10 mg total) by mouth 3 (three) times daily as needed for muscle spasms. 05/22/19   Newman Pies, MD  docusate sodium (COLACE) 100 MG capsule Take 1 capsule (100 mg total) by mouth 2 (two) times daily. 05/22/19   Newman Pies, MD  lisinopril-hydrochlorothiazide (ZESTORETIC) 20-12.5 MG tablet Take 1 tablet by mouth daily.  01/15/19   [provider]  metFORMIN (GLUCOPHAGE) 500 MG tablet Take 500 mg by mouth 2 (two) times daily with a meal.    [provider]  nitrofurantoin, macrocrystal-monohydrate, (MACROBID) 100 MG capsule Take 100 mg by mouth 2 (two) times daily.    [provider]  oxyCODONE (OXY IR/ROXICODONE) 5 MG immediate release tablet Take 1 tablet (5 mg total) by mouth every 3 (three) hours as needed for moderate pain ((score 4 to 6)). 05/22/19   Newman Pies, MD  rosuvastatin (CRESTOR) 5 MG tablet Take 5 mg by mouth daily.    [provider]  tamsulosin (FLOMAX) 0.4 MG CAPS capsule Take 0.4 mg by mouth daily.  04/16/19   [provider]  traMADol (ULTRAM) 50 MG tablet Take 50 mg by mouth every 6 (six) hours as needed for severe pain.    [provider]  Vitamin D, Ergocalciferol, (DRISDOL) 1.25 MG (50000 UT) CAPS capsule Take 50,000 Units by mouth every 7 (seven) days.    [provider]    Physical Exam: Vitals:   06/09/19 1546 06/09/19 1700  BP: 140/75 106/63   Pulse: 80 68  Resp: 19 19  Temp: 99.8 F (37.7 C)   TempSrc: Oral   SpO2: 97% 95%    Constitutional: NAD, calm, comfortable, well appearing gentleman with facial flushing laying in bed Vitals:   06/09/19 1546 06/09/19 1700  BP: 140/75 106/63  Pulse: 80 68  Resp: 19 19  Temp: 99.8 F (37.7 C)   TempSrc: Oral   SpO2: 97% 95%   Eyes: PERRL, lids and conjunctivae normal ENMT: Mucous membranes are moist. Posterior pharynx clear of any exudate or lesions. Neck: normal, supple, no masses Respiratory: clear to auscultation bilaterally, no wheezing, no crackles. Normal respiratory effort on room air. No accessory muscle use.  Cardiovascular: Regular rate and rhythm, no murmurs / rubs / gallops. No extremity edema. 2+ pedal pulses.  Abdomen:  no tenderness, no masses palpated.  Bowel sounds positive.  Musculoskeletal: no clubbing / cyanosis. No joint deformity upper and lower extremities. Good ROM, no contractures. Normal muscle tone.  Skin: no rashes, lesions, ulcers. No induration Neurologic: CN 2-12 grossly intact. Sensation intact.. Strength 4/5 in all of the lower extremities.  Psychiatric:  Alert and oriented x 3 but unable to recall timeline surround his symptoms. Normal mood.     Labs on Admission: I have personally reviewed following labs and imaging studies  CBC: Recent Labs  Lab 06/09/19 1557  WBC 7.3  HGB 11.1*  HCT 32.1*  MCV 85.6  PLT 123456   Basic Metabolic Panel: Recent Labs  Lab 06/09/19 1557  NA 117*  K 4.3  CL 81*  CO2 19*  GLUCOSE 205*  BUN 20  CREATININE 1.22  CALCIUM 8.6*   GFR: CrCl cannot be calculated (Unknown ideal weight.). Liver Function Tests: Recent Labs  Lab 06/09/19 1557  AST 58*  ALT 45*  ALKPHOS 93  BILITOT 0.6  PROT 6.6  ALBUMIN 3.0*   Recent Labs  Lab 06/09/19 1557  LIPASE 26   No results for input(s): AMMONIA in the last 168 hours. Coagulation Profile: No results for input(s): INR, PROTIME in the last 168 hours.  Cardiac Enzymes: No results for input(s): CKTOTAL, CKMB, CKMBINDEX, TROPONINI in the last 168 hours. BNP (last 3 results) No results for input(s): PROBNP in the last 8760 hours. HbA1C: No results for input(s): HGBA1C in the last 72 hours. CBG: No results for input(s): GLUCAP in the last 168 hours. Lipid Profile: No results for input(s): CHOL, HDL, LDLCALC, TRIG, CHOLHDL, LDLDIRECT in the last 72 hours. Thyroid Function Tests: No results for input(s): TSH, T4TOTAL, FREET4, T3FREE, THYROIDAB in the last 72 hours. Anemia Panel: No results for input(s): VITAMINB12, FOLATE, FERRITIN, TIBC, IRON, RETICCTPCT in the last 72 hours. Urine analysis: No results found for: COLORURINE, APPEARANCEUR, LABSPEC, [place], GLUCOSEU, HGBUR, BILIRUBINUR, KETONESUR, PROTEINUR, UROBILINOGEN, NITRITE, LEUKOCYTESUR  Radiological Exams on Admission: Dg Chest Port 1 View  Result Date: 06/09/2019 CLINICAL DATA:  Weakness EXAM: PORTABLE CHEST 1 VIEW COMPARISON:  02/18/2012 FINDINGS: The heart size and mediastinal contours are within normal limits. Both lungs are clear. Aortic atherosclerosis. The visualized skeletal structures are unremarkable. IMPRESSION: No active disease. Electronically Signed   By: Donavan Foil M.D.   On: 06/09/2019 16:48    EKG: Independently reviewed.   Assessment/Plan Weakness/malaise in the setting of decrease PO intake and hyponatremia - No clear signs of infection to suggest why he would have decrease PO intake. No signs of infection to recent lumbar back surgical site. - Check blood culture for completeness especially with recent back surgery. No fever or leukocytosis to warrant antibiotics at this time. - Hypotremia likely added to confusion and weakness.  - consult PT  Hyponatremia - corrected sodium of 119-likely due to decrease intake - has received 1L of IV NS bolus in the ED - consulted nephrology to assist with fluid recommendations- signed out also given to NP Bodenheimer  to place any fluid orders after my departure.  - check BMP q4hrs   Anemia - Hemoglobin of 11.1 on admission compared with 13.7 three weeks ago prior to surgery. Estimate blood loss was 200cc on day of surgery on 10/26 which does not account for this much decrease in hemoglobin today. - will check iron panel, vitamin 123456, folic and FOBT   Metabolic acidosis - anion gap of 17 on admission - likely due to dehydration -  continue to follow serial BMP  Transaminitis  - AST of 58 and ALT 45- could be decrease prefusion - since it is only mildling elevated will follow CMP in the morning before further workup.   Hypertension - continue atenolol  - hold lisinopril-HCTZ given hyponatremia  Hx of CVA - continue statin , unsure why pt is not on aspirin  BPH/Urinary retention - continue bethanechol and flomax  - pt had Macrobid on med list and son said this was given pre-op- will hold for now and check urine culture  - check bladder scan for decrease UOP. Creatinine is normal.   DVT prophylaxis:.Lovenox Code Status:DNR Family Communication: Plan discussed with patient and son at bedside. All questions answered disposition Plan: Home with at least 2 midnight stays  Consults called: Nephrology Admission status: inpatient      Eily Louvier T Shamel Galyean DO [facility]   If 7PM-7AM, please contact night-coverage [email or web address] Password [email or web address]  06/09/2019, 6:10 PM

## 2019-06-10 DIAGNOSIS — R531 Weakness: Secondary | ICD-10-CM

## 2019-06-10 DIAGNOSIS — I10 Essential (primary) hypertension: Secondary | ICD-10-CM | POA: Diagnosis present

## 2019-06-10 LAB — BLOOD CULTURE ID PANEL (REFLEXED)

## 2019-06-10 LAB — COMPREHENSIVE METABOLIC PANEL
ALT: 39 U/L (ref 0–44)
AST: 35 U/L (ref 15–41)
Albumin: 2.9 g/dL — ABNORMAL LOW (ref 3.5–5.0)
Alkaline Phosphatase: 94 U/L (ref 38–126)
Anion gap: 15 (ref 5–15)
BUN: 17 mg/dL (ref 8–23)
CO2: 24 mmol/L (ref 22–32)
Calcium: 8.9 mg/dL (ref 8.9–10.3)
Chloride: 87 mmol/L — ABNORMAL LOW (ref 98–111)
Creatinine, Ser: 0.96 mg/dL (ref 0.61–1.24)
GFR calc Af Amer: >60 mL/min (ref >60)
GFR calc non Af Amer: >60 mL/min (ref >60)
Glucose, Bld: 115 mg/dL — ABNORMAL HIGH (ref 70–99)
Potassium: 4 mmol/L (ref 3.5–5.1)
Sodium: 126 mmol/L — ABNORMAL LOW (ref 135–145)
Total Bilirubin: 0.7 mg/dL (ref 0.3–1.2)
Total Protein: 6.9 g/dL (ref 6.5–8.1)

## 2019-06-10 LAB — OSMOLALITY: Osmolality: 253 mOsm/kg — ABNORMAL LOW (ref 275–295)

## 2019-06-10 LAB — BASIC METABOLIC PANEL
Anion gap: 12 (ref 5–15)
Anion gap: 15 (ref 5–15)
Anion gap: 15 (ref 5–15)
Anion gap: 14 (ref 5–15)
BUN: 18 mg/dL (ref 8–23)
BUN: 15 mg/dL (ref 8–23)
BUN: 15 mg/dL (ref 8–23)
BUN: 16 mg/dL (ref 8–23)
CO2: 22 mmol/L (ref 22–32)
CO2: 21 mmol/L — ABNORMAL LOW (ref 22–32)
CO2: 22 mmol/L (ref 22–32)
CO2: 22 mmol/L (ref 22–32)
Calcium: 8.5 mg/dL — ABNORMAL LOW (ref 8.9–10.3)
Calcium: 8.7 mg/dL — ABNORMAL LOW (ref 8.9–10.3)
Calcium: 8.9 mg/dL (ref 8.9–10.3)
Calcium: 8.5 mg/dL — ABNORMAL LOW (ref 8.9–10.3)
Chloride: 90 mmol/L — ABNORMAL LOW (ref 98–111)
Chloride: 87 mmol/L — ABNORMAL LOW (ref 98–111)
Chloride: 84 mmol/L — ABNORMAL LOW (ref 98–111)
Chloride: 84 mmol/L — ABNORMAL LOW (ref 98–111)
Creatinine, Ser: 0.94 mg/dL (ref 0.61–1.24)
Creatinine, Ser: 0.96 mg/dL (ref 0.61–1.24)
Creatinine, Ser: 0.73 mg/dL (ref 0.61–1.24)
Creatinine, Ser: 0.89 mg/dL (ref 0.61–1.24)
GFR calc Af Amer: >60 mL/min (ref >60)
GFR calc Af Amer: >60 mL/min (ref >60)
GFR calc Af Amer: >60 mL/min (ref >60)
GFR calc Af Amer: >60 mL/min (ref >60)
GFR calc non Af Amer: >60 mL/min (ref >60)
GFR calc non Af Amer: >60 mL/min (ref >60)
GFR calc non Af Amer: >60 mL/min (ref >60)
GFR calc non Af Amer: >60 mL/min (ref >60)
Glucose, Bld: 155 mg/dL — ABNORMAL HIGH (ref 70–99)
Glucose, Bld: 183 mg/dL — ABNORMAL HIGH (ref 70–99)
Glucose, Bld: 102 mg/dL — ABNORMAL HIGH (ref 70–99)
Glucose, Bld: 179 mg/dL — ABNORMAL HIGH (ref 70–99)
Potassium: 3.9 mmol/L (ref 3.5–5.1)
Potassium: 4 mmol/L (ref 3.5–5.1)
Potassium: 4 mmol/L (ref 3.5–5.1)
Potassium: 4.4 mmol/L (ref 3.5–5.1)
Sodium: 124 mmol/L — ABNORMAL LOW (ref 135–145)
Sodium: 123 mmol/L — ABNORMAL LOW (ref 135–145)
Sodium: 121 mmol/L — ABNORMAL LOW (ref 135–145)
Sodium: 120 mmol/L — ABNORMAL LOW (ref 135–145)

## 2019-06-10 LAB — CORTISOL-AM, BLOOD: Cortisol - AM: 23.3 ug/dL — ABNORMAL HIGH (ref 6.7–22.6)

## 2019-06-10 LAB — RENAL FUNCTION PANEL
Albumin: 2.7 g/dL — ABNORMAL LOW (ref 3.5–5.0)
Anion gap: 15 (ref 5–15)
BUN: 16 mg/dL (ref 8–23)
CO2: 20 mmol/L — ABNORMAL LOW (ref 22–32)
Calcium: 8.4 mg/dL — ABNORMAL LOW (ref 8.9–10.3)
Chloride: 86 mmol/L — ABNORMAL LOW (ref 98–111)
Creatinine, Ser: 0.97 mg/dL (ref 0.61–1.24)
GFR calc Af Amer: >60 mL/min (ref >60)
GFR calc non Af Amer: >60 mL/min (ref >60)
Glucose, Bld: 153 mg/dL — ABNORMAL HIGH (ref 70–99)
Phosphorus: 2.9 mg/dL (ref 2.5–4.6)
Potassium: 4 mmol/L (ref 3.5–5.1)
Sodium: 121 mmol/L — ABNORMAL LOW (ref 135–145)

## 2019-06-10 LAB — CBC
HCT: 28.7 % — ABNORMAL LOW (ref 39.0–52.0)
Hemoglobin: 10.2 g/dL — ABNORMAL LOW (ref 13.0–17.0)
MCH: 29.7 pg (ref 26.0–34.0)
MCHC: 35.5 g/dL (ref 30.0–36.0)
MCV: 83.7 fL (ref 80.0–100.0)
Platelets: 248 10*3/uL (ref 150–400)
RBC: 3.43 MIL/uL — ABNORMAL LOW (ref 4.22–5.81)
RDW: 13.1 % (ref 11.5–15.5)
WBC: 5.4 10*3/uL (ref 4.0–10.5)
nRBC: 0 % (ref 0.0–0.2)

## 2019-06-10 LAB — URINE CULTURE: Culture: NO GROWTH

## 2019-06-10 LAB — TSH: TSH: 1.978 u[IU]/mL (ref 0.350–4.500)

## 2019-06-10 LAB — OSMOLALITY, URINE: Osmolality, Ur: 516 mOsm/kg (ref 300–900)

## 2019-06-10 LAB — SODIUM, URINE, RANDOM: Sodium, Ur: 105 mmol/L

## 2019-06-10 MED ORDER — LISINOPRIL 20 MG PO TABS
20.0000 mg | ORAL_TABLET | Freq: Every day | ORAL | Status: DC
  Administered 2019-06-10 – 2019-06-14 (×5): 20 mg via ORAL
  Filled 2019-06-10 (×5): qty 1

## 2019-06-10 MED ORDER — CEFAZOLIN SODIUM-DEXTROSE 2-4 GM/100ML-% IV SOLN
2.0000 g | Freq: Three times a day (TID) | INTRAVENOUS | Status: DC
  Administered 2019-06-10 – 2019-06-19 (×26): 2 g via INTRAVENOUS
  Filled 2019-06-10 (×29): qty 100

## 2019-06-10 MED ORDER — ACETAMINOPHEN 325 MG PO TABS
650.0000 mg | ORAL_TABLET | Freq: Four times a day (QID) | ORAL | Status: DC | PRN
  Administered 2019-06-10 – 2019-06-18 (×4): 650 mg via ORAL
  Filled 2019-06-10 (×5): qty 2

## 2019-06-10 MED ORDER — FERROUS SULFATE 325 (65 FE) MG PO TABS
325.0000 mg | ORAL_TABLET | Freq: Two times a day (BID) | ORAL | Status: DC
  Administered 2019-06-10 – 2019-06-19 (×18): 325 mg via ORAL
  Filled 2019-06-10 (×18): qty 1

## 2019-06-10 MED ORDER — SODIUM CHLORIDE 1 G PO TABS
1.0000 g | ORAL_TABLET | Freq: Three times a day (TID) | ORAL | Status: DC
  Administered 2019-06-10 – 2019-06-19 (×26): 1 g via ORAL
  Filled 2019-06-10 (×29): qty 1

## 2019-06-10 MED ORDER — OXYCODONE HCL 5 MG PO TABS: 5.0000 mg | ORAL_TABLET | Freq: Four times a day (QID) | ORAL | Status: DC | PRN

## 2019-06-10 NOTE — Progress Notes (Signed)
PHARMACY - PHYSICIAN COMMUNICATION CRITICAL VALUE ALERT - BLOOD CULTURE IDENTIFICATION (BCID)  David Bird is an 83 y.o. male who presented to [facility] on 06/09/2019 with a chief complaint of weakness - back surgery 2 weeks ago.  MSSA in 2/2 blood on BCID  Name of physician (or Provider) Contacted: David Bird [facility])  Current antibiotics: None  Changes to prescribed antibiotics recommended:  Cefazolin 2 g q8h Auto ID Consult in AM  Results for orders placed or performed during the hospital encounter of 06/09/19  Blood Culture ID Panel (Reflexed) (Collected: 06/09/2019  8:55 PM)  Result Value Ref Range   Enterococcus species NOT DETECTED NOT DETECTED   Listeria monocytogenes NOT DETECTED NOT DETECTED   Staphylococcus species DETECTED (A) NOT DETECTED   Staphylococcus aureus (BCID) DETECTED (A) NOT DETECTED   Methicillin resistance NOT DETECTED NOT DETECTED   Streptococcus species NOT DETECTED NOT DETECTED   Streptococcus agalactiae NOT DETECTED NOT DETECTED   Streptococcus pneumoniae NOT DETECTED NOT DETECTED   Streptococcus pyogenes NOT DETECTED NOT DETECTED   Acinetobacter baumannii NOT DETECTED NOT DETECTED   Enterobacteriaceae species NOT DETECTED NOT DETECTED   Enterobacter cloacae complex NOT DETECTED NOT DETECTED   Escherichia coli NOT DETECTED NOT DETECTED   Klebsiella oxytoca NOT DETECTED NOT DETECTED   Klebsiella pneumoniae NOT DETECTED NOT DETECTED   Proteus species NOT DETECTED NOT DETECTED   Serratia marcescens NOT DETECTED NOT DETECTED   Haemophilus influenzae NOT DETECTED NOT DETECTED   Neisseria meningitidis NOT DETECTED NOT DETECTED   Pseudomonas aeruginosa NOT DETECTED NOT DETECTED   Candida albicans NOT DETECTED NOT DETECTED   Candida glabrata NOT DETECTED NOT DETECTED   Candida krusei NOT DETECTED NOT DETECTED   Candida parapsilosis NOT DETECTED NOT DETECTED   Candida tropicalis NOT DETECTED NOT DETECTED   Barth Kirks, PharmD, BCPS, BCCCP Clinical  Pharmacist [phone]  Please check AMION for all [place] numbers  06/10/2019 8:42 PM

## 2019-06-10 NOTE — Progress Notes (Signed)
  [facility] Progress Note    Assessment/ Plan:   1. Hyponatremia - in pt w/ prob dehydration after recent back surgery 2 wks ago.  BP's and HR are stable.  Likely exacerbated by HCTZ--> hold for now.  Na stabilizing, allow to eat and drink ad lib.  Will recheck urine/ serum osms and Na too today.   2. HTN - avoid acei/ thiazide for now, any other agents ok 3. SP recent lumbar decompression surg 4. HL 5. DM on oral agents  Subjective:    Na up with IVFs.  Appetite improved, ate nearly all of breakfast this AM.  Good UOP.     Objective:   BP (!) 152/76 (BP Location: Left Arm)   Pulse 66   Temp 98.6 F (37 C) (Oral)   Resp 18   SpO2 98%   Intake/Output Summary (Last 24 hours) at 06/10/2019 1147 Last data filed at 06/10/2019 0900 Gross per 24 hour  Intake 1745.39 ml  Output 950 ml  Net 795.39 ml   Weight change:   Physical Exam: [id number] man, in bed, NAS CVS: RRR no m/r/g Resp: clear bilaterally no c/w/r Abd: soft, nontender Ext: no LE edema  Imaging: Dg Chest Port 1 View  Result Date: 06/09/2019 CLINICAL DATA:  Weakness EXAM: PORTABLE CHEST 1 VIEW COMPARISON:  02/18/2012 FINDINGS: The heart size and mediastinal contours are within normal limits. Both lungs are clear. Aortic atherosclerosis. The visualized skeletal structures are unremarkable. IMPRESSION: No active disease. Electronically Signed   By: Donavan Foil M.D.   On: 06/09/2019 16:48    Labs: BMET Recent Labs  Lab 06/09/19 1557 06/09/19 2055 06/10/19 0017 06/10/19 0506 06/10/19 0909  NA 117* 122* 124* 126* 123*  K 4.3 3.7 3.9 4.0 4.0  CL 81* 87* 90* 87* 87*  CO2 19* 21* 22 24 21*  GLUCOSE 205* 178* 155* 115* 183*  BUN 20 18 18 17 15   CREATININE 1.22 1.00 0.94 0.96 0.96  CALCIUM 8.6* 8.6* 8.5* 8.9 8.7*   CBC Recent Labs  Lab 06/09/19 1557 06/10/19 0017  WBC 7.3 5.4  HGB 11.1* 10.2*  HCT 32.1* 28.7*  MCV 85.6 83.7  PLT 292 248    Medications:    . atenolol  50 mg Oral  Daily  . bethanechol  25 mg Oral Daily  . docusate sodium  100 mg Oral BID  . enoxaparin (LOVENOX) injection  40 mg Subcutaneous Q24H  . rosuvastatin  5 mg Oral Daily  . sodium chloride flush  3 mL Intravenous Once  . tamsulosin  0.4 mg Oral QPC supper      Madelon Lips, MD 06/10/2019, 11:47 AM

## 2019-06-10 NOTE — Progress Notes (Signed)
PROGRESS NOTE    David Bird  [id number] DOB: March 30, 1936 DOA: 06/09/2019 PCP: Ocie Doyne., MD   Brief Narrative:  David Bird is a 83 y.o. male with medical history significant of history of lymphoma, CAD, CVA, hypertension, hyperlipidemia, neurogenic claudication s/p L3-4 decompression in October who presents with concerns of decreased appetite and weakness.  Patient recently had back surgery about 2 weeks ago and was living with him for 2 weeks and was otherwise well.  Patient then returned back to living on his own and son called him 2 days ago and realized he had been laying in bed for 2 days due to weakness.  He then saw urology for follow-up of his BPH 2 days ago and reportedly had lab work that showed dehydration but no UTI.  Then Friday night he saw his neurosurgeon for follow-up and reportedly had no issues with surgical site. No worsening back or leg pain since surgery. Son brought him back to his house and attempted to give him more food and fluid but decided to bring him in since he was showing some mild confusion and increase pallor.  He also notes that patient has been having decreased urinary output more than usual despite being treated on bethanechol by urology.  Patient notes that he had dysuria about 3 to 4 days ago but this has since resolved.  No fever.   ED Course: He had temperature of 99.8 and was normotensive on room air. CBC showed no leukocytosis but had anemia of 11.1 which was not seen 3 weeks ago. Sodium of 117, glucose of 205, creatinine normal at 1.22, AST of 58 and ALT of 45. Anion gap of 17.  Chest XR negative.  EKG shows first degree AV block.   Assessment & Plan:   Active Problems:   Hyponatremia  Generalized weakness: Due to poor p.o. intake and acute hyponatremia.  He feels better today.  To be evaluated by PT OT today.  Acute hypovolemic hyponatremia: Likely secondary to poor p.o. intake.  Per patient, when he was living by himself, he  would barely get up to get food or water due to his back pain and he was taking pain medications on top of that.  He was given normal saline bolus.  His sodium improved from 1 17-126 today.  Nephrology on board.  No more fluids recommended.  Monitor sodium.  Metabolic acidosis: Was mild and has resolved.  Anemia: Source unknown.  Seems to be combination of iron deficiency anemia and anemia of chronic disease.  We will start him on iron tablets twice daily.  He is on Colace to prevent constipation.  Hemoglobin is stable.  Elevated LFTs: Likely due to poor perfusion.  These have normalized now.  Essential hypertension: Slightly elevated.  Continue atenolol.  Resume lisinopril.  Hold hydrochlorothiazide due to hyponatremia.  Hx of CVA - continue statin , unsure why pt is not on aspirin  BPH/Urinary retention - continue bethanechol and flomax  - pt had Macrobid on med list and son said this was given pre-op- will hold for now and check urine culture  - check bladder scan for decrease UOP. Creatinine is normal.   DVT prophylaxis: Lovenox Code Status: Full code Family Communication: Son present at bedside.  Plan of care discussed with patient and the son in length and he verbalized understanding and agreed with it. Disposition Plan: Potentially discharge tomorrow.  PT to assess today.  Estimated body mass index is 23.82 kg/m as calculated from  the following:   Height as of 05/20/19: 5\' 10"  (1.778 m).   Weight as of 05/20/19: 75.3 kg.      Nutritional status:               Consultants:   Nephrology  Procedures:   None  Antimicrobials:   None   Subjective: Seen and examined.  He states that his weakness has improved somewhat.  No other complaint.  Son at the bedside.  Objective: Vitals:   06/09/19 1700 06/09/19 1950 06/10/19 0509 06/10/19 0940  BP: 106/63 (!) 147/68 (!) 173/79 (!) 152/76  Pulse: 68 (!) 58 67 66  Resp: 19 18 16 18   Temp:  98 F (36.7 C)  99.1 F (37.3 C) 98.6 F (37 C)  TempSrc:  Oral Oral Oral  SpO2: 95% 100% 99% 98%    Intake/Output Summary (Last 24 hours) at 06/10/2019 1310 Last data filed at 06/10/2019 0900 Gross per 24 hour  Intake 1745.39 ml  Output 950 ml  Net 795.39 ml   There were no vitals filed for this visit.  Examination:  General exam: Appears calm and comfortable  Respiratory system: Clear to auscultation. Respiratory effort normal. Cardiovascular system: S1 & S2 heard, RRR. No JVD, murmurs, rubs, gallops or clicks. No pedal edema. Gastrointestinal system: Abdomen is nondistended, soft and nontender. No organomegaly or masses felt. Normal bowel sounds heard. Central nervous system: Alert and oriented. No focal neurological deficits. Extremities: Symmetric 5 x 5 power. Skin: No rashes, lesions or ulcers Psychiatry: Judgement and insight appear normal. Mood & affect appropriate.    Data Reviewed: I have personally reviewed following labs and imaging studies  CBC: Recent Labs  Lab 06/09/19 1557 06/10/19 0017  WBC 7.3 5.4  HGB 11.1* 10.2*  HCT 32.1* 28.7*  MCV 85.6 83.7  PLT 292 [id number]   Basic Metabolic Panel: Recent Labs  Lab 06/09/19 2055 06/10/19 0017 06/10/19 0506 06/10/19 0909 06/10/19 1152  NA 122* 124* 126* 123* 121*  K 3.7 3.9 4.0 4.0 4.0  CL 87* 90* 87* 87* 84*  CO2 21* 22 24 21* 22  GLUCOSE 178* 155* 115* 183* 102*  BUN 18 18 17 15 15   CREATININE 1.00 0.94 0.96 0.96 0.73  CALCIUM 8.6* 8.5* 8.9 8.7* 8.9   GFR: CrCl cannot be calculated (Unknown ideal weight.). Liver Function Tests: Recent Labs  Lab 06/09/19 1557 06/10/19 0506  AST 58* 35  ALT 45* 39  ALKPHOS 93 94  BILITOT 0.6 0.7  PROT 6.6 6.9  ALBUMIN 3.0* 2.9*   Recent Labs  Lab 06/09/19 1557  LIPASE 26   No results for input(s): AMMONIA in the last 168 hours. Coagulation Profile: No results for input(s): INR, PROTIME in the last 168 hours. Cardiac Enzymes: No results for input(s): CKTOTAL, CKMB,  CKMBINDEX, TROPONINI in the last 168 hours. BNP (last 3 results) No results for input(s): PROBNP in the last 8760 hours. HbA1C: No results for input(s): HGBA1C in the last 72 hours. CBG: No results for input(s): GLUCAP in the last 168 hours. Lipid Profile: No results for input(s): CHOL, HDL, LDLCALC, TRIG, CHOLHDL, LDLDIRECT in the last 72 hours. Thyroid Function Tests: Recent Labs    06/10/19 0506  TSH 1.978   Anemia Panel: Recent Labs    06/09/19 2055  VITAMINB12 305  FERRITIN 852*  TIBC 224*  IRON 14*   Sepsis Labs: No results for input(s): PROCALCITON, LATICACIDVEN in the last 168 hours.  Recent Results (from the past 240 hour(s))  SARS CORONAVIRUS  2 (TAT 6-24 HRS) Nasopharyngeal Nasopharyngeal Swab     Status: None   Collection Time: 06/09/19  5:05 PM   Specimen: Nasopharyngeal Swab  Result Value Ref Range Status   SARS Coronavirus 2 NEGATIVE NEGATIVE Final    Comment: (NOTE) SARS-CoV-2 target nucleic acids are NOT DETECTED. The SARS-CoV-2 RNA is generally detectable in upper and lower respiratory specimens during the acute phase of infection. Negative results do not preclude SARS-CoV-2 infection, do not rule out co-infections with other pathogens, and should not be used as the sole basis for treatment or other patient management decisions. Negative results must be combined with clinical observations, patient history, and epidemiological information. The expected result is Negative. Fact Sheet for Patients: SugarRoll.be Fact Sheet for Healthcare Providers: https://www.woods-mathews.com/ This test is not yet approved or cleared by the [place] FDA and  has been authorized for detection and/or diagnosis of SARS-CoV-2 by FDA under an Emergency Use Authorization (EUA). This EUA will remain  in effect (meaning this test can be used) for the duration of the COVID-19 declaration under Section 56 4(b)(1) of the Act, 21  U.S.C. section 360bbb-3(b)(1), unless the authorization is terminated or revoked sooner. Performed at [facility], [place] [address] [place], [place] [address]   Culture, blood (routine x 2)     Status: None (Preliminary result)   Collection Time: 06/09/19  8:55 PM   Specimen: BLOOD LEFT ARM  Result Value Ref Range Status   Specimen Description BLOOD LEFT ARM  Final   Special Requests   Final    BOTTLES DRAWN AEROBIC AND ANAEROBIC Blood Culture adequate volume   Culture   Final    NO GROWTH < 24 HOURS Performed at [facility], [place] [address] [place], [place] [address]    Report Status PENDING  Incomplete  Culture, blood (routine x 2)     Status: None (Preliminary result)   Collection Time: 06/09/19  9:00 PM   Specimen: BLOOD LEFT HAND  Result Value Ref Range Status   Specimen Description BLOOD LEFT HAND  Final   Special Requests   Final    BOTTLES DRAWN AEROBIC ONLY Blood Culture adequate volume   Culture   Final    NO GROWTH < 24 HOURS Performed at [facility], [place] [address], [place],  [address]    Report Status PENDING  Incomplete      Radiology Studies: Dg Chest Port 1 View  Result Date: 06/09/2019 CLINICAL DATA:  Weakness EXAM: PORTABLE CHEST 1 VIEW COMPARISON:  02/18/2012 FINDINGS: The heart size and mediastinal contours are within normal limits. Both lungs are clear. Aortic atherosclerosis. The visualized skeletal structures are unremarkable. IMPRESSION: No active disease. Electronically Signed   By: Donavan Foil M.D.   On: 06/09/2019 16:48    Scheduled Meds: . atenolol  50 mg Oral Daily  . bethanechol  25 mg Oral Daily  . docusate sodium  100 mg Oral BID  . enoxaparin (LOVENOX) injection  40 mg Subcutaneous Q24H  . rosuvastatin  5 mg Oral Daily  . sodium chloride flush  3 mL Intravenous Once  . tamsulosin  0.4 mg Oral QPC supper   Continuous Infusions:   LOS: 1 day   Time spent: 32 minutes   Darliss Cheney, MD [facility]  06/10/2019, 1:10 PM   To contact the attending provider between 7A-7P or the covering provider during after hours 7P-7A, please log into the web site [email or web address] and use password [email or web address].

## 2019-06-10 NOTE — Evaluation (Signed)
Physical Therapy Evaluation Patient Details Name: David Bird MRN: [id number] DOB: [birth year] Today's Date: 06/10/2019   History of Present Illness  Pt is an 83 y/o male who had a L3-L4 TLIF on 05/20/2019. Pt d/c home with son and reports he was progressing well so transitioned home alone where he became weak, lost his appetite, and was laying in bed ~2 days before calling son to come get him.  PMH significant for CVA, HTN, DMII, CAD s/p stent, CA, partial gastrectomy (~1960), R ankle fracture with plate fixation.   Clinical Impression  Pt admitted with above diagnosis. At the time of PT eval pt was able to perform transfers with supervision for safety to modified independence, and ambulation with min guard assist to supervision for safety. Tolerance for functional activity is low at this time, and pt visibly fatigued after ambulating ~200 feet. Pt known to this therapist, and appears deconditioned compared to last admission after back surgery. Pt reports he plans to discharge home with son again, and feel pt would benefit from HHPT follow-up to monitor progress at d/c as well. Pt currently with functional limitations due to the deficits listed below (see PT Problem List). Pt will benefit from skilled PT to increase their independence and safety with mobility to allow discharge to the venue listed below.       Follow Up Recommendations Home health PT;Supervision for mobility/OOB    Equipment Recommendations  None recommended by PT    Recommendations for Other Services       Precautions / Restrictions Precautions Precautions: Fall;Back Precaution Booklet Issued: No Precaution Comments: reviewed back precautions during functional mobility.  Required Braces or Orthoses: Spinal Brace Spinal Brace: Lumbar corset;Applied in sitting position Restrictions Weight Bearing Restrictions: No      Mobility  Bed Mobility Overal bed mobility: Modified Independent             General  bed mobility comments: HOB flat with min use of rails. Increased time but no assist required.   Transfers Overall transfer level: Needs assistance Equipment used: Rolling walker (2 wheeled) Transfers: Sit to/from Stand Sit to Stand: Supervision         General transfer comment: Guarded upon first stand but no unsteadiness or LOB noted.   Ambulation/Gait Ambulation/Gait assistance: Supervision;Min guard Gait Distance (Feet): 200 Feet Assistive device: Rolling walker (2 wheeled) Gait Pattern/deviations: Step-through pattern;Decreased stride length;Trunk flexed Gait velocity: Decreased Gait velocity interpretation: <1.31 ft/sec, indicative of household ambulator General Gait Details: Slow but generally steady with RW for support. Pt was cued for improved posture, closer walker proximity, and forward gaze.   Stairs         General stair comments: VC's for sequencing and general safety. No assist required.   Wheelchair Mobility    Modified Rankin (Stroke Patients Only)       Balance Overall balance assessment: Needs assistance Sitting-balance support: Feet supported;No upper extremity supported Sitting balance-Leahy Scale: Fair     Standing balance support: No upper extremity supported;During functional activity Standing balance-Leahy Scale: Fair                               Pertinent Vitals/Pain Pain Assessment: 0-10 Pain Score: 5  Pain Location: back Pain Descriptors / Indicators: Operative site guarding;Discomfort Pain Intervention(s): Limited activity within patient's tolerance;Repositioned;Patient requesting pain meds-RN notified    Home Living Family/patient expects to be discharged to:: Private residence Living Arrangements: Alone Available Help at  Discharge: Family;Available 24 hours/day Type of Home: House Home Access: Stairs to enter Entrance Stairs-Rails: Psychiatric nurse of Steps: 3 Home Layout: Two level;Able to live  on main level with bedroom/bathroom Home Equipment: Gilford Rile - 2 wheels;Cane - single point;Shower seat Additional Comments: Above information is for son's home where pt plans to go to first before returning home alone.     Prior Function Level of Independence: Independent               Hand Dominance   Dominant Hand: Right    Extremity/Trunk Assessment   Upper Extremity Assessment Upper Extremity Assessment: Generalized weakness    Lower Extremity Assessment Lower Extremity Assessment: Generalized weakness    Cervical / Trunk Assessment Cervical / Trunk Assessment: Other exceptions Cervical / Trunk Exceptions: s/p surgery ~3 weeks ago  Communication   Communication: No difficulties  Cognition Arousal/Alertness: Awake/alert Behavior During Therapy: Flat affect Overall Cognitive Status: Within Functional Limits for tasks assessed                                        General Comments      Exercises     Assessment/Plan    PT Assessment Patent does not need any further PT services  PT Problem List         PT Treatment Interventions DME instruction;Gait training;Functional mobility training;Therapeutic activities;Therapeutic exercise;Neuromuscular re-education;Patient/family education    PT Goals (Current goals can be found in the Care Plan section)  Acute Rehab PT Goals Patient Stated Goal: Get back to "normal" PT Goal Formulation: All assessment and education complete, DC therapy Time For Goal Achievement: 06/17/19 Potential to Achieve Goals: Good    Frequency Min 5X/week   Barriers to discharge        Co-evaluation               AM-PAC PT "6 Clicks" Mobility  Outcome Measure Help needed turning from your back to your side while in a flat bed without using bedrails?: None Help needed moving from lying on your back to sitting on the side of a flat bed without using bedrails?: None Help needed moving to and from a bed to a chair  (including a wheelchair)?: A Little Help needed standing up from a chair using your arms (e.g., wheelchair or bedside chair)?: A Little Help needed to walk in hospital room?: A Little Help needed climbing 3-5 steps with a railing? : A Little 6 Click Score: 20    End of Session Equipment Utilized During Treatment: Back brace;Gait belt Activity Tolerance: Patient tolerated treatment well Patient left: in chair;with call bell/phone within reach Nurse Communication: Mobility status PT Visit Diagnosis: Unsteadiness on feet (R26.81);Pain Pain - part of body: (back)    Time: [id number] PT Time Calculation (min) (ACUTE ONLY): 19 min   Charges:   PT Evaluation $PT Eval Moderate Complexity: 1 Mod          Rolinda Roan, PT, DPT Acute Rehabilitation Services Pager: [phone] Office: [phone]   [name] 06/10/2019, 2:54 PM

## 2019-06-11 ENCOUNTER — Inpatient Hospital Stay (HOSPITAL_COMMUNITY): Payer: Medicare Other

## 2019-06-11 DIAGNOSIS — I340 Nonrheumatic mitral (valve) insufficiency: Secondary | ICD-10-CM

## 2019-06-11 DIAGNOSIS — I361 Nonrheumatic tricuspid (valve) insufficiency: Secondary | ICD-10-CM

## 2019-06-11 LAB — BASIC METABOLIC PANEL
Anion gap: 12 (ref 5–15)
Anion gap: 14 (ref 5–15)
BUN: 16 mg/dL (ref 8–23)
BUN: 14 mg/dL (ref 8–23)
CO2: 24 mmol/L (ref 22–32)
CO2: 22 mmol/L (ref 22–32)
Calcium: 8.6 mg/dL — ABNORMAL LOW (ref 8.9–10.3)
Calcium: 8.7 mg/dL — ABNORMAL LOW (ref 8.9–10.3)
Chloride: 87 mmol/L — ABNORMAL LOW (ref 98–111)
Chloride: 88 mmol/L — ABNORMAL LOW (ref 98–111)
Creatinine, Ser: 0.91 mg/dL (ref 0.61–1.24)
Creatinine, Ser: 0.95 mg/dL (ref 0.61–1.24)
GFR calc Af Amer: >60 mL/min (ref >60)
GFR calc Af Amer: >60 mL/min (ref >60)
GFR calc non Af Amer: >60 mL/min (ref >60)
GFR calc non Af Amer: >60 mL/min (ref >60)
Glucose, Bld: 126 mg/dL — ABNORMAL HIGH (ref 70–99)
Glucose, Bld: 119 mg/dL — ABNORMAL HIGH (ref 70–99)
Potassium: 3.9 mmol/L (ref 3.5–5.1)
Potassium: 3.9 mmol/L (ref 3.5–5.1)
Sodium: 123 mmol/L — ABNORMAL LOW (ref 135–145)
Sodium: 124 mmol/L — ABNORMAL LOW (ref 135–145)

## 2019-06-11 LAB — SODIUM
Sodium: 121 mmol/L — ABNORMAL LOW (ref 135–145)
Sodium: 122 mmol/L — ABNORMAL LOW (ref 135–145)
Sodium: 123 mmol/L — ABNORMAL LOW (ref 135–145)

## 2019-06-11 LAB — FOLATE RBC
Folate, Hemolysate: 468 ng/mL
Folate, RBC: 1597 ng/mL (ref >498)
Hematocrit: 29.3 % — ABNORMAL LOW (ref 37.5–51.0)

## 2019-06-11 LAB — ECHOCARDIOGRAM COMPLETE

## 2019-06-11 MED ORDER — ALUM & MAG HYDROXIDE-SIMETH 200-200-20 MG/5ML PO SUSP
15.0000 mL | Freq: Four times a day (QID) | ORAL | Status: DC | PRN
  Administered 2019-06-11 (×2): 15 mL via ORAL
  Filled 2019-06-11 (×3): qty 30

## 2019-06-11 MED ORDER — SODIUM CHLORIDE 0.9 % IV SOLN
INTRAVENOUS | Status: DC
  Administered 2019-06-11: 11:00:00 via INTRAVENOUS
  Filled 2019-06-11: qty 1000

## 2019-06-11 NOTE — Consult Note (Signed)
[facility] for Infectious Disease    Date of Admission:  06/09/2019     Total days of antibiotics 2               Reason for Consult: MSSA bacteremia   Referring Provider: Tivis Ringer / [employer] Primary Care Provider: Ocie Doyne., MD   ASSESSMENT:  David Bird is a 83 y/o male with MSSA bacteremia of unclear source in the setting of previous lumbar fusion on 05/20/19. This is complicated by hyponatremia which is being followed by nephrology. Previous surgical incision is without drainage however there is a small area in the center of the incision with some surrounding edema that may represent infection. Did have previous UTI which may also be a source. Will check TTE. Repeat blood cultures have been ordered. We discussed need for IV antibiotic therapy and likely need for prolonged therapy. Has previously noted penicillin allergy, however tolerating Cefazolin thus far. Will continue current dose of Cefazolin for now and consider penicillin challenge if needed.   PLAN:  1. Continue cefazolin 2. Recheck blood cultures 3. TTE to check for endocarditis 4. Recommend imaging of back to check for abscess formation.    Active Problems:   Acute hyponatremia   Generalized weakness   Essential hypertension   . atenolol  50 mg Oral Daily  . bethanechol  25 mg Oral Daily  . docusate sodium  100 mg Oral BID  . enoxaparin (LOVENOX) injection  40 mg Subcutaneous Q24H  . ferrous sulfate  325 mg Oral BID WC  . lisinopril  20 mg Oral Daily  . rosuvastatin  5 mg Oral Daily  . sodium chloride flush  3 mL Intravenous Once  . sodium chloride  1 g Oral Q8H  . tamsulosin  0.4 mg Oral QPC supper     HPI: David Bird is a 83 y.o. male with previous medical history of coronary artery disease, type 2 diabetes, dyslipidemia, hypertension, lymphoma status post treatment with chemo and radiation and spinal stenosis status post lumbar fusion on 05/20/2019 admitted with concerns of  decreased appetite and weakness.  David Bird had a temperature of 99.8 in the ED with hyponatremia of 117, glucose 205, creatinine 1.22, and AST of 58 and ALT of 45.  Chest x-ray with no evidence of pneumonia.  Previous surgical wound appears without evidence of infection.  Blood cultures were obtained for complete evaluation.  Nephrology was consulted for hyponatremia in the setting of hypovolemia.  David Bird has been afebrile since admission with no leukocytosis. Blood cultures are now positive for MSSA bacteremia in 3/4 bottles. Antibiotic therapy was started with Cefazolin. Per chart review David Bird stayed with his son following surgery for 2 weeks then returned to his home. He has been seen by urology for BPH with no evidence of UTI and followed with neurosurgery with the wound healing without evidence of infection.   David Bird has been having shaking chills that started about 3 days ago but no measurable fever. Denies any increased pain in his back or pain elsewhere. Was started on Keflex by Dr. Arnoldo Morale on 06/07/19.    Review of Systems: Review of Systems  Constitutional: Negative for chills, fever and weight loss.  Respiratory: Negative for cough, shortness of breath and wheezing.   Cardiovascular: Negative for chest pain and leg swelling.  Gastrointestinal: Negative for abdominal pain, constipation, diarrhea, nausea and vomiting.  Skin: Negative for rash.     Past Medical History:  Diagnosis Date  .  Cancer ([place])    Lymphona treated with Chemo and radiation  . Coronary artery disease   . Diabetes mellitus without complication ([place])    Type II  . Dyslipidemia   . Gastric ulcer   . Hypertension   . Spinal stenosis   . Spondylolisthesis   . Stroke [facility])    2013ish, left hand slightly weaker than Right    Social History   Tobacco Use  . Smoking status: Former Smoker    Years: 20.00    Quit date: 1970    Years since quitting: 50.9  . Smokeless tobacco: Former Systems developer     Quit date: 1980  Substance Use Topics  . Alcohol use: Never    Frequency: Never  . Drug use: Never    History reviewed. No pertinent family history.  Allergies  Allergen Reactions  . Penicillins Rash    Did it involve swelling of the face/tongue/throat, SOB, or low BP? No Did it involve sudden or severe rash/hives, skin peeling, or any reaction on the inside of your mouth or nose? No Did you need to seek medical attention at a hospital or doctor's office? Yes When did it last happen?40 years ago If all above answers are "NO", may proceed with cephalosporin use.     OBJECTIVE: Blood pressure (!) 163/78, pulse 67, temperature 98.2 F (36.8 C), temperature source Oral, resp. rate 16, SpO2 99 %.  Physical Exam Constitutional:      General: He is not in acute distress.    Appearance: He is well-developed.  Cardiovascular:     Rate and Rhythm: Normal rate and regular rhythm.     Heart sounds: Normal heart sounds.  Pulmonary:     Effort: Pulmonary effort is normal.     Breath sounds: Normal breath sounds.  Skin:    General: Skin is warm and dry.  Neurological:     Mental Status: He is alert and oriented to person, place, and time.  Psychiatric:        Behavior: Behavior normal.        Thought Content: Thought content normal.        Judgment: Judgment normal.     Lab Results Lab Results  Component Value Date   WBC 5.4 06/10/2019   HGB 10.2 (L) 06/10/2019   HCT 28.7 (L) 06/10/2019   MCV 83.7 06/10/2019   PLT 248 06/10/2019    Lab Results  Component Value Date   CREATININE 0.95 06/11/2019   BUN 14 06/11/2019   NA 124 (L) 06/11/2019   K 3.9 06/11/2019   CL 88 (L) 06/11/2019   CO2 22 06/11/2019    Lab Results  Component Value Date   ALT 39 06/10/2019   AST 35 06/10/2019   ALKPHOS 94 06/10/2019   BILITOT 0.7 06/10/2019     Microbiology: Recent Results (from the past 240 hour(s))  SARS CORONAVIRUS 2 (TAT 6-24 HRS) Nasopharyngeal Nasopharyngeal Swab      Status: None   Collection Time: 06/09/19  5:05 PM   Specimen: Nasopharyngeal Swab  Result Value Ref Range Status   SARS Coronavirus 2 NEGATIVE NEGATIVE Final    Comment: (NOTE) SARS-CoV-2 target nucleic acids are NOT DETECTED. The SARS-CoV-2 RNA is generally detectable in upper and lower respiratory specimens during the acute phase of infection. Negative results do not preclude SARS-CoV-2 infection, do not rule out co-infections with other pathogens, and should not be used as the sole basis for treatment or other patient management decisions. Negative results must be  combined with clinical observations, patient history, and epidemiological information. The expected result is Negative. Fact Sheet for Patients: SugarRoll.be Fact Sheet for Healthcare Providers: https://www.woods-mathews.com/ This test is not yet approved or cleared by the [place] FDA and  has been authorized for detection and/or diagnosis of SARS-CoV-2 by FDA under an Emergency Use Authorization (EUA). This EUA will remain  in effect (meaning this test can be used) for the duration of the COVID-19 declaration under Section 56 4(b)(1) of the Act, 21 U.S.C. section 360bbb-3(b)(1), unless the authorization is terminated or revoked sooner. Performed at [facility], [place] [employer] [address], [place], [place] [address]   Culture, Urine     Status: None   Collection Time: 06/09/19  8:40 PM   Specimen: Urine, Random  Result Value Ref Range Status   Specimen Description URINE, RANDOM  Final   Special Requests NONE  Final   Culture   Final    NO GROWTH Performed at [facility], [place] [address] [place], [place] [address]    Report Status 06/10/2019 FINAL  Final  Culture, blood (routine x 2)     Status: Abnormal (Preliminary result)   Collection Time: 06/09/19  8:55 PM   Specimen: BLOOD LEFT ARM  Result Value Ref Range Status   Specimen Description BLOOD LEFT ARM   Final   Special Requests   Final    BOTTLES DRAWN AEROBIC AND ANAEROBIC Blood Culture adequate volume   Culture  Setup Time   Final    GRAM POSITIVE COCCI IN BOTH AEROBIC AND ANAEROBIC BOTTLES CRITICAL RESULT CALLED TO, READ BACK BY AND VERIFIED WITH: Hughie Closs [facility] 06/10/19 2036 JDW    Culture (A)  Final    STAPHYLOCOCCUS AUREUS SUSCEPTIBILITIES TO FOLLOW Performed at [facility], [place] [address], [place], [place] [address]    Report Status PENDING  Incomplete  Blood Culture ID Panel (Reflexed)     Status: Abnormal   Collection Time: 06/09/19  8:55 PM  Result Value Ref Range Status   Enterococcus species NOT DETECTED NOT DETECTED Final   Listeria monocytogenes NOT DETECTED NOT DETECTED Final   Staphylococcus species DETECTED (A) NOT DETECTED Final    Comment: CRITICAL RESULT CALLED TO, READ BACK BY AND VERIFIED WITH: Hughie Closs [facility] 06/10/19 2036 JDW    Staphylococcus aureus (BCID) DETECTED (A) NOT DETECTED Final    Comment: Methicillin (oxacillin) susceptible Staphylococcus aureus (MSSA). Preferred therapy is anti staphylococcal beta lactam antibiotic (Cefazolin or Nafcillin), unless clinically contraindicated. CRITICAL RESULT CALLED TO, READ BACK BY AND VERIFIED WITH: Hughie Closs [facility] 06/10/19 2036 JDW    Methicillin resistance NOT DETECTED NOT DETECTED Final   Streptococcus species NOT DETECTED NOT DETECTED Final   Streptococcus agalactiae NOT DETECTED NOT DETECTED Final   Streptococcus pneumoniae NOT DETECTED NOT DETECTED Final   Streptococcus pyogenes NOT DETECTED NOT DETECTED Final   Acinetobacter baumannii NOT DETECTED NOT DETECTED Final   Enterobacteriaceae species NOT DETECTED NOT DETECTED Final   Enterobacter cloacae complex NOT DETECTED NOT DETECTED Final   Escherichia coli NOT DETECTED NOT DETECTED Final   Klebsiella oxytoca NOT DETECTED NOT DETECTED Final   Klebsiella pneumoniae NOT DETECTED NOT DETECTED Final   Proteus species NOT DETECTED NOT DETECTED  Final   Serratia marcescens NOT DETECTED NOT DETECTED Final   Haemophilus influenzae NOT DETECTED NOT DETECTED Final   Neisseria meningitidis NOT DETECTED NOT DETECTED Final   Pseudomonas aeruginosa NOT DETECTED NOT DETECTED Final   Candida albicans NOT DETECTED NOT  DETECTED Final   Candida glabrata NOT DETECTED NOT DETECTED Final   Candida krusei NOT DETECTED NOT DETECTED Final   Candida parapsilosis NOT DETECTED NOT DETECTED Final   Candida tropicalis NOT DETECTED NOT DETECTED Final    Comment: Performed at [facility], [place] [address] [place], [place] [address]  Culture, blood (routine x 2)     Status: Abnormal (Preliminary result)   Collection Time: 06/09/19  9:00 PM   Specimen: BLOOD LEFT HAND  Result Value Ref Range Status   Specimen Description BLOOD LEFT HAND  Final   Special Requests   Final    BOTTLES DRAWN AEROBIC ONLY Blood Culture adequate volume   Culture  Setup Time   Final    GRAM POSITIVE COCCI AEROBIC BOTTLE ONLY CRITICAL VALUE NOTED.  VALUE IS CONSISTENT WITH PREVIOUSLY REPORTED AND CALLED VALUE.    Culture (A)  Final    STAPHYLOCOCCUS AUREUS SUSCEPTIBILITIES TO FOLLOW Performed at [facility], [place] [address], [place], [place] [address]    Report Status PENDING  Incomplete     Terri Piedra, [place] for [facility] [phone] Pager  06/11/2019  1:13 PM

## 2019-06-11 NOTE — Progress Notes (Signed)
PT Cancellation Note  Patient Details Name: David Bird MRN: [id number] DOB: 18-Feb-1936   Cancelled Treatment:    Reason Eval/Treat Not Completed: Patient declined, no reason specified. Pt reports he just got some "bad news" from the doctor regarding having a "bad staph infection", and appears flat and solemn, asking therapist how serious it is.  Pt declined any OOB mobility with therapist at this time, stating he just wants to wait for the "next doctor" to come in. Encouraged pt to discuss any immediate concerns with RN and then with MD when he/she arrives.    [name] 06/11/2019, 2:01 PM   Rolinda Roan, PT, DPT Acute Rehabilitation Services Pager: [phone] Office: [phone]

## 2019-06-11 NOTE — TOC Progression Note (Signed)
Transition of Care [facility]) - Progression Note    Patient Details  Name: David Bird MRN: [id number] Date of Birth: [birth year]  [facility] CM/SW Contact  Rae Mar, RN Phone Number: 06/11/2019, 2:27 PM  Clinical Narrative:     CM consulted for [facility] needs.  CM spoke with pt at beside who declined [place].  He reports his son will help him.  Updated primary RN, charge RN, and Dr. Doristine Bosworth.  No further TOC needs noted at this time but is avaliable if needs arise.  Expected Discharge Plan: Home/Self Care Barriers to Discharge: Continued Medical Work up  Expected Discharge Plan and Services Expected Discharge Plan: Home/Self Care   Discharge Planning Services: CM Consult   Living arrangements for the past 2 months: Single Family Home Expected Discharge Date: 06/11/19                                     Social Determinants of Health (SDOH) Interventions    Readmission Risk Interventions No flowsheet data found.

## 2019-06-11 NOTE — Progress Notes (Signed)
  Echocardiogram 2D Echocardiogram  has been performed.  David Bird M 06/11/2019, 2:39 PM

## 2019-06-11 NOTE — Progress Notes (Signed)
  [facility] Progress Note    Assessment/ Plan:   1. Hyponatremia:  Initially looked like hypotonic hyponatremia, after volume resuscitation mixed pic developed with SIADH as well.  SIADH predominating at present- fluid restrict and salt tabs continue.  Avoid HCTZ and ACEi.  Serial Na checks.  2. MSSA bacteremia: on cefazolin.    3. HTN - avoid acei/ thiazide for now, any other agents ok 4. SP recent lumbar decompression surg 5. HL 6. DM on oral agents 7. Dispo: pending rx plan for MSSA bacteremia and better Na  Subjective:    Na drifted down yesterday to 121, repeat urine/ serum lytes consistent with SIADH, fluid restricted and salt tabs initated.  Better this AM, now up to 124.  2/2 blood cultures with MSSA bacteremia   Objective:   BP (!) 146/69 (BP Location: Left Arm)   Pulse 70   Temp 98.4 F (36.9 C) (Oral)   Resp 16   SpO2 96%   Intake/Output Summary (Last 24 hours) at 06/11/2019 1025 Last data filed at 06/11/2019 0900 Gross per 24 hour  Intake 470.07 ml  Output 1190 ml  Net -719.93 ml   Weight change:   Physical Exam: Gen: older man NAD CVS: RRR no m/r/g Resp: clear bilaterally Abd: soft Ext: no LE edema NEURO: AAO x 3  Imaging: Dg Chest Port 1 View  Result Date: 06/09/2019 CLINICAL DATA:  Weakness EXAM: PORTABLE CHEST 1 VIEW COMPARISON:  02/18/2012 FINDINGS: The heart size and mediastinal contours are within normal limits. Both lungs are clear. Aortic atherosclerosis. The visualized skeletal structures are unremarkable. IMPRESSION: No active disease. Electronically Signed   By: Donavan Foil M.D.   On: 06/09/2019 16:48    Labs: BMET Recent Labs  Lab 06/10/19 0506 06/10/19 0909 06/10/19 1152 06/10/19 1641 06/10/19 1821 06/11/19 0026 06/11/19 0651  NA 126* 123* 121* 121* 120* 123* 124*  K 4.0 4.0 4.0 4.0 4.4 3.9 3.9  CL 87* 87* 84* 86* 84* 87* 88*  CO2 24 21* 22 20* 22 24 22   GLUCOSE 115* 183* 102* 153* 179* 126* 119*  BUN 17 15  15 16 16 16 14   CREATININE 0.96 0.96 0.73 0.97 0.89 0.91 0.95  CALCIUM 8.9 8.7* 8.9 8.4* 8.5* 8.6* 8.7*  PHOS  --   --   --  2.9  --   --   --    CBC Recent Labs  Lab 06/09/19 1557 06/10/19 0017  WBC 7.3 5.4  HGB 11.1* 10.2*  HCT 32.1* 28.7*  MCV 85.6 83.7  PLT 292 248    Medications:    . atenolol  50 mg Oral Daily  . bethanechol  25 mg Oral Daily  . docusate sodium  100 mg Oral BID  . enoxaparin (LOVENOX) injection  40 mg Subcutaneous Q24H  . ferrous sulfate  325 mg Oral BID WC  . lisinopril  20 mg Oral Daily  . rosuvastatin  5 mg Oral Daily  . sodium chloride flush  3 mL Intravenous Once  . sodium chloride  1 g Oral Q8H  . tamsulosin  0.4 mg Oral QPC supper      Madelon Lips MD 06/11/2019, 10:25 AM

## 2019-06-11 NOTE — Progress Notes (Addendum)
PROGRESS NOTE    David Bird  [id number] DOB: 05-15-1936 DOA: 06/09/2019 PCP: Ocie Doyne., MD   Brief Narrative:  David Bird is a 83 y.o. male with medical history significant of history of lymphoma, CAD, CVA, hypertension, hyperlipidemia, neurogenic claudication s/p L3-4 decompression in October who presents with concerns of decreased appetite and weakness.  Patient recently had back surgery about 2 weeks ago and was living with him for 2 weeks and was otherwise well.  Patient then returned back to living on his own and son called him 2 days ago and realized he had been laying in bed for 2 days due to weakness.  He then saw urology for follow-up of his BPH 2 days ago and reportedly had lab work that showed dehydration but no UTI.  Then Friday night he saw his neurosurgeon for follow-up and reportedly had no issues with surgical site. No worsening back or leg pain since surgery. Son brought him back to his house and attempted to give him more food and fluid but decided to bring him in since he was showing some mild confusion and increase pallor.  He also notes that patient has been having decreased urinary output more than usual despite being treated on bethanechol by urology.  Patient notes that he had dysuria about 3 to 4 days ago but this has since resolved.  No fever.   ED Course: He had temperature of 99.8 and was normotensive on room air. CBC showed no leukocytosis but had anemia of 11.1 which was not seen 3 weeks ago. Sodium of 117, glucose of 205, creatinine normal at 1.22, AST of 58 and ALT of 45. Anion gap of 17.  Chest XR negative.   Patient was admitted under hospitalist service for hyponatremia.  Nephrology was consulted.  Sodium improved to 126 yesterday but has dropped to 124 today.  He received only 1 L of IV fluid bolus in the emergency department.  Nephrology has recommended fluid restriction and salt tablets.  Blood cultures growing MSSA.  On cefazolin.  Waiting  for final susceptibilities.  We will repeat blood culture today.   Assessment & Plan:   Active Problems:   Acute hyponatremia   Generalized weakness   Essential hypertension  Generalized weakness: Due to poor p.o. intake and acute hyponatremia.  Improving daily.  Seen by PT OT and they recommend home health.  Acute hypovolemic hyponatremia: Likely secondary to poor p.o. intake.  Per patient, when he was living by himself, he would barely get up to get food or water due to his back pain and he was taking pain medications on top of that.  He was given normal saline bolus.  His sodium improved from 1 17-126 yesterday but has remained stable and in fact has dropped a little bit to 124 today.  Per nephrology, work-up indicates SIADH in the recommend no IV fluids and have started on sodium tablets.  Will monitor sodium every 8 hours.  Metabolic acidosis: Was mild and has resolved.  Anemia: Source unknown.  Seems to be combination of iron deficiency anemia and anemia of chronic disease.  Started on iron tablets which I will continue.  Hemoglobin is stable.  Elevated LFTs: Likely due to poor perfusion.  These have normalized now.  Essential hypertension: Fairly controlled.  Continue atenolol and lisinopril.  Hold hydrochlorothiazide due to hyponatremia.  Hx of CVA - continue statin , unsure why pt is not on aspirin  BPH/Urinary retention - continue bethanechol and flomax  -  pt had Macrobid on med list and son said this was given pre-op- will hold for now and check urine culture  - check bladder scan for decrease UOP. Creatinine is normal.   MSSA bacteremia: Cultures growing MSSA.  Urine culture negative.  Source unknown.  Continue cefazolin.  Pete blood culture today.  DVT prophylaxis: Lovenox Code Status: Full code Family Communication: No family present at bedside today.  Son was present at bedside today and we had a long discussion together. Disposition Plan: Potentially discharge in  1 to 2 days.  Estimated body mass index is 23.82 kg/m as calculated from the following:   Height as of 05/20/19: 5\' 10"  (1.778 m).   Weight as of 05/20/19: 75.3 kg.      Nutritional status:               Consultants:   Nephrology  Procedures:   None  Antimicrobials:   IV cefazolin starting 06/10/2019.   Subjective: Seen and examined.  Feels little stronger.  No new complaint.  Objective: Vitals:   06/10/19 1636 06/10/19 2146 06/11/19 0519 06/11/19 0800  BP: 116/63 (!) 152/68 114/62 (!) 146/69  Pulse: 71 67 77 70  Resp: 18 20 18 16   Temp: 98.2 F (36.8 C) 99.5 F (37.5 C)  98.4 F (36.9 C)  TempSrc: Oral Oral  Oral  SpO2: 97% 95% 97% 96%    Intake/Output Summary (Last 24 hours) at 06/11/2019 0949 Last data filed at 06/11/2019 0900 Gross per 24 hour  Intake 200.07 ml  Output 1190 ml  Net -989.93 ml   There were no vitals filed for this visit.  Examination:  General exam: Appears calm and comfortable  Respiratory system: Clear to auscultation. Respiratory effort normal. Cardiovascular system: S1 & S2 heard, RRR. No JVD, murmurs, rubs, gallops or clicks. No pedal edema. Gastrointestinal system: Abdomen is nondistended, soft and nontender. No organomegaly or masses felt. Normal bowel sounds heard. Central nervous system: Alert and oriented. No focal neurological deficits. Extremities: Symmetric 5 x 5 power. Skin: No rashes, lesions or ulcers.  Has a well-healing incision in the lower back.  Minimally tender.  No erythema or warmth. Psychiatry: Judgement and insight appear normal. Mood & affect appropriate.   Data Reviewed: I have personally reviewed following labs and imaging studies  CBC: Recent Labs  Lab 06/09/19 1557 06/10/19 0017  WBC 7.3 5.4  HGB 11.1* 10.2*  HCT 32.1* 28.7*  MCV 85.6 83.7  PLT 292 [id number]   Basic Metabolic Panel: Recent Labs  Lab 06/10/19 1152 06/10/19 1641 06/10/19 1821 06/11/19 0026 06/11/19 0651  NA 121*  121* 120* 123* 124*  K 4.0 4.0 4.4 3.9 3.9  CL 84* 86* 84* 87* 88*  CO2 22 20* 22 24 22   GLUCOSE 102* 153* 179* 126* 119*  BUN 15 16 16 16 14   CREATININE 0.73 0.97 0.89 0.91 0.95  CALCIUM 8.9 8.4* 8.5* 8.6* 8.7*  PHOS  --  2.9  --   --   --    GFR: CrCl cannot be calculated (Unknown ideal weight.). Liver Function Tests: Recent Labs  Lab 06/09/19 1557 06/10/19 0506 06/10/19 1641  AST 58* 35  --   ALT 45* 39  --   ALKPHOS 93 94  --   BILITOT 0.6 0.7  --   PROT 6.6 6.9  --   ALBUMIN 3.0* 2.9* 2.7*   Recent Labs  Lab 06/09/19 1557  LIPASE 26   No results for input(s): AMMONIA in the last 168  hours. Coagulation Profile: No results for input(s): INR, PROTIME in the last 168 hours. Cardiac Enzymes: No results for input(s): CKTOTAL, CKMB, CKMBINDEX, TROPONINI in the last 168 hours. BNP (last 3 results) No results for input(s): PROBNP in the last 8760 hours. HbA1C: No results for input(s): HGBA1C in the last 72 hours. CBG: No results for input(s): GLUCAP in the last 168 hours. Lipid Profile: No results for input(s): CHOL, HDL, LDLCALC, TRIG, CHOLHDL, LDLDIRECT in the last 72 hours. Thyroid Function Tests: Recent Labs    06/10/19 0506  TSH 1.978   Anemia Panel: Recent Labs    06/09/19 2055  VITAMINB12 305  FERRITIN 852*  TIBC 224*  IRON 14*   Sepsis Labs: No results for input(s): PROCALCITON, LATICACIDVEN in the last 168 hours.  Recent Results (from the past 240 hour(s))  SARS CORONAVIRUS 2 (TAT 6-24 HRS) Nasopharyngeal Nasopharyngeal Swab     Status: None   Collection Time: 06/09/19  5:05 PM   Specimen: Nasopharyngeal Swab  Result Value Ref Range Status   SARS Coronavirus 2 NEGATIVE NEGATIVE Final    Comment: (NOTE) SARS-CoV-2 target nucleic acids are NOT DETECTED. The SARS-CoV-2 RNA is generally detectable in upper and lower respiratory specimens during the acute phase of infection. Negative results do not preclude SARS-CoV-2 infection, do not rule out  co-infections with other pathogens, and should not be used as the sole basis for treatment or other patient management decisions. Negative results must be combined with clinical observations, patient history, and epidemiological information. The expected result is Negative. Fact Sheet for Patients: SugarRoll.be Fact Sheet for Healthcare Providers: https://www.woods-mathews.com/ This test is not yet approved or cleared by the [place] FDA and  has been authorized for detection and/or diagnosis of SARS-CoV-2 by FDA under an Emergency Use Authorization (EUA). This EUA will remain  in effect (meaning this test can be used) for the duration of the COVID-19 declaration under Section 56 4(b)(1) of the Act, 21 U.S.C. section 360bbb-3(b)(1), unless the authorization is terminated or revoked sooner. Performed at [facility], [place] [address], [place], [place] [address]   Culture, Urine     Status: None   Collection Time: 06/09/19  8:40 PM   Specimen: Urine, Random  Result Value Ref Range Status   Specimen Description URINE, RANDOM  Final   Special Requests NONE  Final   Culture   Final    NO GROWTH Performed at [facility], [place] [address], [place], [place] [address]    Report Status 06/10/2019 FINAL  Final  Culture, blood (routine x 2)     Status: None (Preliminary result)   Collection Time: 06/09/19  8:55 PM   Specimen: BLOOD LEFT ARM  Result Value Ref Range Status   Specimen Description BLOOD LEFT ARM  Final   Special Requests   Final    BOTTLES DRAWN AEROBIC AND ANAEROBIC Blood Culture adequate volume   Culture  Setup Time   Final    GRAM POSITIVE COCCI IN BOTH AEROBIC AND ANAEROBIC BOTTLES Organism ID to follow CRITICAL RESULT CALLED TO, READ BACK BY AND VERIFIED WITH: Hughie Closs [facility] 06/10/19 2036 JDW Performed at [facility], [address], [place], [place] [address]    Culture PENDING  Incomplete   Report  Status PENDING  Incomplete  Blood Culture ID Panel (Reflexed)     Status: Abnormal   Collection Time: 06/09/19  8:55 PM  Result Value Ref Range Status   Enterococcus species NOT DETECTED NOT  DETECTED Final   Listeria monocytogenes NOT DETECTED NOT DETECTED Final   Staphylococcus species DETECTED (A) NOT DETECTED Final    Comment: CRITICAL RESULT CALLED TO, READ BACK BY AND VERIFIED WITH: Hughie Closs [facility] 06/10/19 2036 JDW    Staphylococcus aureus (BCID) DETECTED (A) NOT DETECTED Final    Comment: Methicillin (oxacillin) susceptible Staphylococcus aureus (MSSA). Preferred therapy is anti staphylococcal beta lactam antibiotic (Cefazolin or Nafcillin), unless clinically contraindicated. CRITICAL RESULT CALLED TO, READ BACK BY AND VERIFIED WITH: Hughie Closs [facility] 06/10/19 2036 JDW    Methicillin resistance NOT DETECTED NOT DETECTED Final   Streptococcus species NOT DETECTED NOT DETECTED Final   Streptococcus agalactiae NOT DETECTED NOT DETECTED Final   Streptococcus pneumoniae NOT DETECTED NOT DETECTED Final   Streptococcus pyogenes NOT DETECTED NOT DETECTED Final   Acinetobacter baumannii NOT DETECTED NOT DETECTED Final   Enterobacteriaceae species NOT DETECTED NOT DETECTED Final   Enterobacter cloacae complex NOT DETECTED NOT DETECTED Final   Escherichia coli NOT DETECTED NOT DETECTED Final   Klebsiella oxytoca NOT DETECTED NOT DETECTED Final   Klebsiella pneumoniae NOT DETECTED NOT DETECTED Final   Proteus species NOT DETECTED NOT DETECTED Final   Serratia marcescens NOT DETECTED NOT DETECTED Final   Haemophilus influenzae NOT DETECTED NOT DETECTED Final   Neisseria meningitidis NOT DETECTED NOT DETECTED Final   Pseudomonas aeruginosa NOT DETECTED NOT DETECTED Final   Candida albicans NOT DETECTED NOT DETECTED Final   Candida glabrata NOT DETECTED NOT DETECTED Final   Candida krusei NOT DETECTED NOT DETECTED Final   Candida parapsilosis NOT DETECTED NOT DETECTED Final   Candida  tropicalis NOT DETECTED NOT DETECTED Final    Comment: Performed at [facility], [place]. [address] [place], [place] [address]  Culture, blood (routine x 2)     Status: None (Preliminary result)   Collection Time: 06/09/19  9:00 PM   Specimen: BLOOD LEFT HAND  Result Value Ref Range Status   Specimen Description BLOOD LEFT HAND  Final   Special Requests   Final    BOTTLES DRAWN AEROBIC ONLY Blood Culture adequate volume   Culture  Setup Time   Final    GRAM POSITIVE COCCI AEROBIC BOTTLE ONLY CRITICAL VALUE NOTED.  VALUE IS CONSISTENT WITH PREVIOUSLY REPORTED AND CALLED VALUE. Performed at [facility], [place] [address], [place], [place] [address]    Culture PENDING  Incomplete   Report Status PENDING  Incomplete      Radiology Studies: Dg Chest Port 1 View  Result Date: 06/09/2019 CLINICAL DATA:  Weakness EXAM: PORTABLE CHEST 1 VIEW COMPARISON:  02/18/2012 FINDINGS: The heart size and mediastinal contours are within normal limits. Both lungs are clear. Aortic atherosclerosis. The visualized skeletal structures are unremarkable. IMPRESSION: No active disease. Electronically Signed   By: Donavan Foil M.D.   On: 06/09/2019 16:48    Scheduled Meds: . atenolol  50 mg Oral Daily  . bethanechol  25 mg Oral Daily  . docusate sodium  100 mg Oral BID  . enoxaparin (LOVENOX) injection  40 mg Subcutaneous Q24H  . ferrous sulfate  325 mg Oral BID WC  . lisinopril  20 mg Oral Daily  . rosuvastatin  5 mg Oral Daily  . sodium chloride flush  3 mL Intravenous Once  . sodium chloride  1 g Oral Q8H  . tamsulosin  0.4 mg Oral QPC supper   Continuous Infusions: .  ceFAZolin (ANCEF) IV 2 g (06/11/19 0515)  . sodium chloride 0.9 %  1,000 mL with potassium chloride 10 mEq infusion       LOS: 2 days   Time spent: 29 minutes   Darliss Cheney, MD [facility]  06/11/2019, 9:49 AM   To contact the attending provider between 7A-7P or the covering provider during after hours  7P-7A, please log into the web site [email or web address] and use password [email or web address].

## 2019-06-12 DIAGNOSIS — R7881 Bacteremia: Principal | ICD-10-CM

## 2019-06-12 DIAGNOSIS — B9561 Methicillin susceptible Staphylococcus aureus infection as the cause of diseases classified elsewhere: Secondary | ICD-10-CM | POA: Diagnosis present

## 2019-06-12 LAB — CULTURE, BLOOD (ROUTINE X 2)
Special Requests: ADEQUATE
Special Requests: ADEQUATE

## 2019-06-12 LAB — OSMOLALITY: Osmolality: 257 mOsm/kg — ABNORMAL LOW (ref 275–295)

## 2019-06-12 LAB — SODIUM
Sodium: 122 mmol/L — ABNORMAL LOW (ref 135–145)
Sodium: 125 mmol/L — ABNORMAL LOW (ref 135–145)

## 2019-06-12 LAB — BASIC METABOLIC PANEL
Anion gap: 13 (ref 5–15)
BUN: 11 mg/dL (ref 8–23)
CO2: 23 mmol/L (ref 22–32)
Calcium: 8.7 mg/dL — ABNORMAL LOW (ref 8.9–10.3)
Chloride: 88 mmol/L — ABNORMAL LOW (ref 98–111)
Creatinine, Ser: 0.91 mg/dL (ref 0.61–1.24)
GFR calc Af Amer: >60 mL/min (ref >60)
GFR calc non Af Amer: >60 mL/min (ref >60)
Glucose, Bld: 108 mg/dL — ABNORMAL HIGH (ref 70–99)
Potassium: 4.4 mmol/L (ref 3.5–5.1)
Sodium: 124 mmol/L — ABNORMAL LOW (ref 135–145)

## 2019-06-12 MED ORDER — FUROSEMIDE 10 MG/ML IJ SOLN
20.0000 mg | Freq: Once | INTRAMUSCULAR | Status: AC
  Administered 2019-06-12: 20 mg via INTRAVENOUS
  Filled 2019-06-12: qty 2

## 2019-06-12 MED ORDER — RIFAMPIN 300 MG PO CAPS
300.0000 mg | ORAL_CAPSULE | Freq: Two times a day (BID) | ORAL | Status: DC
  Administered 2019-06-12 – 2019-06-19 (×15): 300 mg via ORAL
  Filled 2019-06-12 (×16): qty 1

## 2019-06-12 NOTE — Care Management Important Message (Signed)
Important Message  Patient Details  Name: David Bird MRN: [id number] Date of Birth: 05-16-1936   Medicare Important Message Given:  Yes     Shondrika Hoque [address] 06/12/2019, 3:23 PM

## 2019-06-12 NOTE — Progress Notes (Signed)
[facility] PROGRESS NOTE  David Bird [id number] DOB: April 24, 1936 DOA: 06/09/2019 PCP: Ocie Doyne., MD   Subjective  Complaint back mild back pain Overall weakness improving.  Brief history  He is 83 years old with past with a history of CAD, CVA, hypertension and recent L3-L4 decompression in October presented with decreased appetite and weakness, he was found to have hyponatremia, MSSA bacteremia and metabolic acidosis.  Assessment and plan   Active Problems:   Acute hyponatremia   Generalized weakness   Essential hypertension   MSSA bacteremia Culture growing MSSA, unclear source. Had recent lumbar fusion surgery done by Dr. Arnoldo Morale. Some swelling, questionable infection, neurosurgery to evaluate. Discussed with Dr. Baxter Flattery, likely will be a good idea to treat for 4 to 6 weeks of antibiotics.  Recent L3-4 laminotomy This is done by Dr. Arnoldo Morale on 05/20/2019, patient not complaining about pain. Because of concurrent MSSA bacteremia, MRI was done. MRI showed soft tissue edema but no drainable abscess or fluid collection.  Hyponatremia, acute hypotonic Presented with sodium of 117 and creatinine of 1.22. Nephrology consulted, sodium improving slowly. The goal of treatment is 6-8 mEq when sodium is less than 120, then 0.5 mEq/h  History of CVA Continue statins, not on aspirin for some reason.  BPH/urinary retention Continue on Flomax, patient reported he was treated recently with antibiotics.  Generalized weakness Likely multifactorial secondary to MSSA bacteremia and severe hyponatremia.   Code Status: Full Code Family Communication: Plan discussed with the patient. Disposition Plan: Remains inpatient Diet:  Diet Order            Diet Heart Room service appropriate? Yes; Fluid consistency: Thin; Fluid restriction: 1200 mL Fluid  Diet effective now               Consultants   [place]  Procedures  . None  Antibiotics   Cefazolin  and rifampin   Objective   Vitals:   06/12/19 0900 06/12/19 1100  BP: 130/70 136/72  Pulse: 73 66  Resp: 14 18  Temp: 98.2 F (36.8 C) 98.4 F (36.9 C)  SpO2: 97% 98%    Intake/Output Summary (Last 24 hours) at 06/12/2019 1120 Last data filed at 06/12/2019 0601 Gross per 24 hour  Intake 780 ml  Output 1025 ml  Net -245 ml   Filed Weights   06/12/19 0459  Weight: 67.6 kg    Physical examination  General: Alert and awake, oriented x3, not in any acute distress. HEENT: anicteric sclera, pupils reactive to light and accommodation, EOMI CVS: S1-S2 clear, no murmur rubs or gallops Chest: clear to auscultation bilaterally, no wheezing, rales or rhonchi Abdomen: soft nontender, nondistended, normal bowel sounds, no organomegaly Extremities: no cyanosis, clubbing or edema noted bilaterally Neuro: Cranial nerves II-XII intact, no focal neurological deficits  Reviewed data  Basic Metabolic Panel: Recent Labs  Lab 06/10/19 1641 06/10/19 1821 06/11/19 0026 06/11/19 0651 06/11/19 1303 06/11/19 1713 06/11/19 2324 06/12/19 0529  NA 121* 120* 123* 124* 121* 122* 123* 124*  K 4.0 4.4 3.9 3.9  --   --   --  4.4  CL 86* 84* 87* 88*  --   --   --  88*  CO2 20* 22 24 22   --   --   --  23  GLUCOSE 153* 179* 126* 119*  --   --   --  108*  BUN 16 16 16 14   --   --   --  11  CREATININE 0.97 0.89  0.91 0.95  --   --   --  0.91  CALCIUM 8.4* 8.5* 8.6* 8.7*  --   --   --  8.7*  PHOS 2.9  --   --   --   --   --   --   --    Liver Function Tests: Recent Labs  Lab 06/09/19 1557 06/10/19 0506 06/10/19 1641  AST 58* 35  --   ALT 45* 39  --   ALKPHOS 93 94  --   BILITOT 0.6 0.7  --   PROT 6.6 6.9  --   ALBUMIN 3.0* 2.9* 2.7*   Recent Labs  Lab 06/09/19 1557  LIPASE 26   No results for input(s): AMMONIA in the last 168 hours. CBC: Recent Labs  Lab 06/09/19 1557 06/09/19 2055 06/10/19 0017  WBC 7.3  --  5.4  HGB 11.1*  --  10.2*  HCT 32.1* 29.3* 28.7*  MCV 85.6  --   83.7  PLT 292  --  248   Cardiac Enzymes: No results for input(s): CKTOTAL, CKMB, CKMBINDEX, TROPONINI in the last 168 hours. BNP (last 3 results) No results for input(s): BNP in the last 8760 hours.  ProBNP (last 3 results) No results for input(s): PROBNP in the last 8760 hours.  CBG: No results for input(s): GLUCAP in the last 168 hours.  Micro Recent Results (from the past 240 hour(s))  SARS CORONAVIRUS 2 (TAT 6-24 HRS) Nasopharyngeal Nasopharyngeal Swab     Status: None   Collection Time: 06/09/19  5:05 PM   Specimen: Nasopharyngeal Swab  Result Value Ref Range Status   SARS Coronavirus 2 NEGATIVE NEGATIVE Final    Comment: (NOTE) SARS-CoV-2 target nucleic acids are NOT DETECTED. The SARS-CoV-2 RNA is generally detectable in upper and lower respiratory specimens during the acute phase of infection. Negative results do not preclude SARS-CoV-2 infection, do not rule out co-infections with other pathogens, and should not be used as the sole basis for treatment or other patient management decisions. Negative results must be combined with clinical observations, patient history, and epidemiological information. The expected result is Negative. Fact Sheet for Patients: SugarRoll.be Fact Sheet for Healthcare Providers: https://www.woods-mathews.com/ This test is not yet approved or cleared by the [place] FDA and  has been authorized for detection and/or diagnosis of SARS-CoV-2 by FDA under an Emergency Use Authorization (EUA). This EUA will remain  in effect (meaning this test can be used) for the duration of the COVID-19 declaration under Section 56 4(b)(1) of the Act, 21 U.S.C. section 360bbb-3(b)(1), unless the authorization is terminated or revoked sooner. Performed at [facility], [place] [address], [place], [place] [address]   Culture, Urine     Status: None   Collection Time: 06/09/19  8:40 PM   Specimen: Urine,  Random  Result Value Ref Range Status   Specimen Description URINE, RANDOM  Final   Special Requests NONE  Final   Culture   Final    NO GROWTH Performed at [facility], [place] [address], [place],  [address]    Report Status 06/10/2019 FINAL  Final  Culture, blood (routine x 2)     Status: Abnormal   Collection Time: 06/09/19  8:55 PM   Specimen: BLOOD LEFT ARM  Result Value Ref Range Status   Specimen Description BLOOD LEFT ARM  Final   Special Requests   Final    BOTTLES DRAWN AEROBIC AND ANAEROBIC Blood Culture adequate volume   Culture  Setup Time   Final    GRAM POSITIVE COCCI IN BOTH AEROBIC AND ANAEROBIC BOTTLES CRITICAL RESULT CALLED TO, READ BACK BY AND VERIFIED WITH: Hughie Closs [facility] 06/10/19 2036 JDW Performed at [facility], [address] [place], [place] [address]    Culture STAPHYLOCOCCUS AUREUS (A)  Final   Report Status 06/12/2019 FINAL  Final   Organism ID, Bacteria STAPHYLOCOCCUS AUREUS  Final      Susceptibility   Staphylococcus aureus - MIC*    CIPROFLOXACIN <=0.5 SENSITIVE Sensitive     ERYTHROMYCIN >=8 RESISTANT Resistant     GENTAMICIN <=0.5 SENSITIVE Sensitive     OXACILLIN 0.5 SENSITIVE Sensitive     TETRACYCLINE <=1 SENSITIVE Sensitive     VANCOMYCIN <=0.5 SENSITIVE Sensitive     TRIMETH/SULFA <=10 SENSITIVE Sensitive     CLINDAMYCIN RESISTANT Resistant     RIFAMPIN <=0.5 SENSITIVE Sensitive     Inducible Clindamycin POSITIVE Resistant     * STAPHYLOCOCCUS AUREUS  Blood Culture ID Panel (Reflexed)     Status: Abnormal   Collection Time: 06/09/19  8:55 PM  Result Value Ref Range Status   Enterococcus species NOT DETECTED NOT DETECTED Final   Listeria monocytogenes NOT DETECTED NOT DETECTED Final   Staphylococcus species DETECTED (A) NOT DETECTED Final    Comment: CRITICAL RESULT CALLED TO, READ BACK BY AND VERIFIED WITH: Hughie Closs [facility] 06/10/19 2036 JDW    Staphylococcus aureus (BCID) DETECTED (A) NOT DETECTED Final     Comment: Methicillin (oxacillin) susceptible Staphylococcus aureus (MSSA). Preferred therapy is anti staphylococcal beta lactam antibiotic (Cefazolin or Nafcillin), unless clinically contraindicated. CRITICAL RESULT CALLED TO, READ BACK BY AND VERIFIED WITH: Hughie Closs [facility] 06/10/19 2036 JDW    Methicillin resistance NOT DETECTED NOT DETECTED Final   Streptococcus species NOT DETECTED NOT DETECTED Final   Streptococcus agalactiae NOT DETECTED NOT DETECTED Final   Streptococcus pneumoniae NOT DETECTED NOT DETECTED Final   Streptococcus pyogenes NOT DETECTED NOT DETECTED Final   Acinetobacter baumannii NOT DETECTED NOT DETECTED Final   Enterobacteriaceae species NOT DETECTED NOT DETECTED Final   Enterobacter cloacae complex NOT DETECTED NOT DETECTED Final   Escherichia coli NOT DETECTED NOT DETECTED Final   Klebsiella oxytoca NOT DETECTED NOT DETECTED Final   Klebsiella pneumoniae NOT DETECTED NOT DETECTED Final   Proteus species NOT DETECTED NOT DETECTED Final   Serratia marcescens NOT DETECTED NOT DETECTED Final   Haemophilus influenzae NOT DETECTED NOT DETECTED Final   Neisseria meningitidis NOT DETECTED NOT DETECTED Final   Pseudomonas aeruginosa NOT DETECTED NOT DETECTED Final   Candida albicans NOT DETECTED NOT DETECTED Final   Candida glabrata NOT DETECTED NOT DETECTED Final   Candida krusei NOT DETECTED NOT DETECTED Final   Candida parapsilosis NOT DETECTED NOT DETECTED Final   Candida tropicalis NOT DETECTED NOT DETECTED Final    Comment: Performed at [facility], [place]. [address] [place], [place] [address]  Culture, blood (routine x 2)     Status: Abnormal   Collection Time: 06/09/19  9:00 PM   Specimen: BLOOD LEFT HAND  Result Value Ref Range Status   Specimen Description BLOOD LEFT HAND  Final   Special Requests   Final    BOTTLES DRAWN AEROBIC ONLY Blood Culture adequate volume   Culture  Setup Time   Final    GRAM POSITIVE COCCI AEROBIC BOTTLE ONLY CRITICAL VALUE  NOTED.  VALUE IS CONSISTENT WITH PREVIOUSLY REPORTED AND CALLED VALUE.    Culture (A)  Final  STAPHYLOCOCCUS AUREUS SUSCEPTIBILITIES PERFORMED ON PREVIOUS CULTURE WITHIN THE LAST 5 DAYS. Performed at [facility], [place] [address], [place], [place] [address]    Report Status 06/12/2019 FINAL  Final     Radiological studies, reviewed  Mr Lumbar Spine Wo Contrast  Result Date: 06/11/2019 CLINICAL DATA:  83 year old male status post L3-L4 fusion last month. Back pain with infection suspected. EXAM: MRI LUMBAR SPINE WITHOUT CONTRAST TECHNIQUE: Multiplanar, multisequence MR imaging of the lumbar spine was performed. No intravenous contrast was administered. COMPARISON:  Preoperative lumbar MRI 05/13/2019. FINDINGS: Segmentation: Lumbar segmentation appears to be normal and is the same numbering system used in October. Alignment: Regressed grade 1 spondylolisthesis at L3-L4 following surgery. Stable mild retrolisthesis at L1-L2. Stable vertebral height and alignment otherwise. Vertebrae: Continued mild degenerative appearing endplate marrow edema in the lower thoracic spine at T11-T12 and T12-L1. Hardware susceptibility artifact at L3-L4. Posterior decompression at that level. No convincing marrow edema or evidence of acute osseous abnormality. Intact visible sacrum and SI joints. Conus medullaris and cauda equina: Conus extends to the T12-L1 level. No lower spinal cord or conus signal abnormality. Paraspinal and other soft tissues: Stable visible abdominal viscera. Confluent STIR hyperintensity in the lumbar rect or spine I muscles and throughout the operative bed at the L3-L4 level (series 6 images 6-10). At the laminectomy site on series 8, image 14 there appears to be generalized soft tissue edema. There is mild associated mass effect on the thecal sac (see below). However, in the absence of IV contrast there is no discrete paraspinal fluid collection. The psoas musculature remains normal. Disc  levels: Unchanged degeneration from the lower thoracic spine through L1-L2. L2-L3: Stable circumferential disc bulge. Mildly increased posterior element hypertrophy. Subsequent increased mild to moderate spinal stenosis (series 8, image 9). Stable mild foraminal stenosis greater on the right. L3-L4: Interval decompression and fusion. Edematous posterior soft tissue with mild mass effect on the thecal sac resulting in mild spinal stenosis (series 8, image 15), although thecal sac patency has increased since 05/13/2019. Superimposed granulation tissue including at the right lateral recess and right L3 neural foramen. L4-L5 and L5-S1 levels are stable. IMPRESSION: 1. Extensive soft tissue edema in and around the L3-L4 laminectomy site does raise the possibility of a soft tissue infection. But there is no abscess or fluid collection identified in the absence of IV contrast, and no evidence of osteomyelitis. 2. Soft tissue edema resulting in mild mass effect on the posterior thecal sac at L3-L4. Mild spinal stenosis there although thecal sac patency has improved since the preoperative MRI. 3. Increased mild to moderate multifactorial spinal stenosis at L2-L3 appears related to interval posterior element hypertrophy. 4. Other lumbar levels are stable. Electronically Signed   By: Genevie Ann M.D.   On: 06/11/2019 22:59    Scheduled Meds: . atenolol  50 mg Oral Daily  . bethanechol  25 mg Oral Daily  . docusate sodium  100 mg Oral BID  . enoxaparin (LOVENOX) injection  40 mg Subcutaneous Q24H  . ferrous sulfate  325 mg Oral BID WC  . lisinopril  20 mg Oral Daily  . rifampin  300 mg Oral Q12H  . rosuvastatin  5 mg Oral Daily  . sodium chloride flush  3 mL Intravenous Once  . sodium chloride  1 g Oral Q8H  . tamsulosin  0.4 mg Oral QPC supper   Continuous Infusions: .  ceFAZolin (ANCEF) IV 2 g (06/12/19 0526)     Time spent: 35  minutes  Birdie Hopes  [facility] Pager [phone] If 7PM-7AM, please  contact night-coverage at [email or web address], password [facility] 06/12/2019, 11:20 AM  LOS: 3 days

## 2019-06-12 NOTE — Progress Notes (Signed)
Physical Therapy Treatment Patient Details Name: David Bird MRN: [id number] DOB: 1935/08/23 Today's Date: 06/12/2019    History of Present Illness Pt is an 83 y/o male who had a L3-L4 TLIF on 05/20/2019. Pt d/c home with son and reports he was progressing well so transitioned home alone where he became weak, lost his appetite, and was laying in bed ~2 days before calling son to come get him.  PMH significant for CVA, HTN, DMII, CAD s/p stent, CA, partial gastrectomy (~1960), R ankle fracture with plate fixation.     PT Comments    Pt is making steady progress with functional mobility as he was able to increase gait distances on this date & only requires supervision overall with RW. Pt somewhat hesitant to education & attempting tasks on his own, ex: donning socks, despite encouragement (see below). Pt reports "maybe I can do a little bit more tomorrow" after declining exercises at end of session. Pt would benefit from acute PT services to focus on gait & balance prior to d/c. Will continue to follow pt.    Follow Up Recommendations  Home health PT;Supervision for mobility/OOB     Equipment Recommendations  None recommended by PT    Recommendations for Other Services       Precautions / Restrictions Precautions Precautions: Fall;Back Precaution Booklet Issued: No Precaution Comments: reviewed back precautions during functional mobility.  Required Braces or Orthoses: Spinal Brace Spinal Brace: Lumbar corset;Applied in sitting position Restrictions Weight Bearing Restrictions: No    Mobility  Bed Mobility Overal bed mobility: Modified Independent             General bed mobility comments: maintains back precautions with supine>sit with hospital bed features, does not fully maintain back precautions with sit>supine  Transfers Overall transfer level: Needs assistance Equipment used: Rolling walker (2 wheeled) Transfers: Sit to/from Stand Sit to Stand: Supervision            Ambulation/Gait Ambulation/Gait assistance: Supervision Gait Distance (Feet): 450 Feet Assistive device: Rolling walker (2 wheeled) Gait Pattern/deviations: Step-through pattern;Decreased stride length;Trunk flexed Gait velocity: Decreased   General Gait Details: pt elects to take frequent standing rest breaks as pt reports his BLE are "weak"   Stairs             Wheelchair Mobility    Modified Rankin (Stroke Patients Only)       Balance Overall balance assessment: Needs assistance Sitting-balance support: Feet supported;No upper extremity supported Sitting balance-Leahy Scale: Good     Standing balance support: During functional activity;Bilateral upper extremity supported Standing balance-Leahy Scale: Fair Standing balance comment: BUE on RW                            Cognition Arousal/Alertness: Awake/alert Behavior During Therapy: Flat affect Overall Cognitive Status: Within Functional Limits for tasks assessed                                        Exercises      General Comments General comments (skin integrity, edema, etc.): Pt aware of back precautions as he is aware of inability to lean forward to don socks but unwilling to attempt to don socks/shoes with BLE in figure four position with pt reporting "that's what I've got you here for" despite education & encouragement. Pt declined exercises at end of session.  Pertinent Vitals/Pain Pain Assessment: 0-10 Pain Score: 4  Pain Location: incision Pain Descriptors / Indicators: Operative site guarding;Sore Pain Intervention(s): Monitored during session;Patient requesting pain meds-RN notified    Home Living                      Prior Function            PT Goals (current goals can now be found in the care plan section) Acute Rehab PT Goals Patient Stated Goal: Get back to "normal" PT Goal Formulation: With patient Time For Goal Achievement:  06/17/19 Potential to Achieve Goals: Good Progress towards PT goals: Progressing toward goals    Frequency    Min 5X/week      PT Plan Current plan remains appropriate    Co-evaluation              AM-PAC PT "6 Clicks" Mobility   Outcome Measure  Help needed turning from your back to your side while in a flat bed without using bedrails?: None Help needed moving from lying on your back to sitting on the side of a flat bed without using bedrails?: None Help needed moving to and from a bed to a chair (including a wheelchair)?: A Little Help needed standing up from a chair using your arms (e.g., wheelchair or bedside chair)?: A Little Help needed to walk in hospital room?: A Little Help needed climbing 3-5 steps with a railing? : A Little 6 Click Score: 20    End of Session Equipment Utilized During Treatment: Back brace;Gait belt Activity Tolerance: Patient tolerated treatment well Patient left: in bed;with call bell/phone within reach;with bed alarm set;with family/visitor present(MD entering room) Nurse Communication: Patient requests pain meds PT Visit Diagnosis: Unsteadiness on feet (R26.81);Pain;Muscle weakness (generalized) (M62.81) Pain - part of body: (back)     Time: [id number] PT Time Calculation (min) (ACUTE ONLY): 21 min  Charges:  $Therapeutic Activity: 8-22 mins                         Waunita Schooner, PT, DPT 06/12/2019, 2:49 PM

## 2019-06-12 NOTE — Consult Note (Signed)
Reason for Consult: Positive blood cultures, hyponatremia Referring Physician: Dr. Allene Dillon is an 83 y.o. male.  HPI: The patient is an 83 year old white male on whom I performed an L3-4 decompression, instrumentation and fusion 3 weeks ago.  He was subsequently discharged.  He developed a urinary tract infection was treated with antibiotics.  I saw him in the office 4 days ago.  He had some mild wound erythema.  I started him on Keflex empirically.  The patient declined and was brought to the ER.  He was found to be hyponatremic.  Blood cultures were obtained which subsequently grew gram-positive cocci.  The patient had a lumbar MRI which demonstrated some tissue inflammation, which is not particularly surprising 3 weeks status post surgery.  Presently the patient is alert and pleasant and in no apparent distress.  His son is at the bedside.  There has been no drainage from his wound.  Past Medical History:  Diagnosis Date  . Cancer ([place])    Lymphona treated with Chemo and radiation  . Coronary artery disease   . Diabetes mellitus without complication ([place])    Type II  . Dyslipidemia   . Gastric ulcer   . Hypertension   . Spinal stenosis   . Spondylolisthesis   . Stroke [facility])    2013ish, left hand slightly weaker than Right    Past Surgical History:  Procedure Laterality Date  . ANKLE SURGERY Right    right ankel fracture- has a plate  . CARDIAC CATHETERIZATION    . CORONARY ANGIOPLASTY     stent 1, unsure of date.  [name] Kitchen NECK EXPLORATION    . partial gastrectomy  1960 ish    History reviewed. No pertinent family history.  Social History:  reports that he quit smoking about 50 years ago. He quit after 20.00 years of use. He quit smokeless tobacco use about 40 years ago. He reports that he does not drink alcohol or use drugs.  Allergies:  Allergies  Allergen Reactions  . Penicillins Rash    Did it involve swelling of the face/tongue/throat, SOB, or low BP?  No Did it involve sudden or severe rash/hives, skin peeling, or any reaction on the inside of your mouth or nose? No Did you need to seek medical attention at a hospital or doctor's office? Yes When did it last happen?40 years ago If all above answers are "NO", may proceed with cephalosporin use.     Medications:  I have reviewed the patient's current medications. Prior to Admission:  Medications Prior to Admission  Medication Sig Dispense Refill Last Dose  . Acetaminophen (TYLENOL PO) Take 2 tablets by mouth 2 (two) times daily as needed (pain).   06/08/2019 at Unknown time  . Ascorbic Acid (VITAMIN C) 1000 MG tablet Take 1,000 mg by mouth every other day.   unknown  . atenolol (TENORMIN) 50 MG tablet Take 50 mg by mouth daily.   06/09/2019 at 830  . bethanechol (URECHOLINE) 25 MG tablet Take 25 mg by mouth daily.    06/09/2019 at am  . cephALEXin (KEFLEX) 500 MG capsule Take 500 mg by mouth every 6 (six) hours.   06/09/2019 at am  . docusate sodium (COLACE) 100 MG capsule Take 1 capsule (100 mg total) by mouth 2 (two) times daily. 60 capsule 0 06/09/2019 at am  . lisinopril-hydrochlorothiazide (ZESTORETIC) 20-12.5 MG tablet Take 1.5 tablets by mouth daily.    06/09/2019 at am  . metFORMIN (GLUCOPHAGE-XR) 500 MG 24  hr tablet Take 500 mg by mouth 2 (two) times daily as needed (CBG >150).    3 weeks ago?  . nitrofurantoin, macrocrystal-monohydrate, (MACROBID) 100 MG capsule Take 100 mg by mouth 2 (two) times daily.   06/09/2019 at am  . oxyCODONE (OXY IR/ROXICODONE) 5 MG immediate release tablet Take 1 tablet (5 mg total) by mouth every 3 (three) hours as needed for moderate pain ((score 4 to 6)). (Patient taking differently: Take 2.5-5 mg by mouth every 3 (three) hours as needed for moderate pain ((score 4 to 6)). ) 30 tablet 0 06/09/2019 at am  . rosuvastatin (CRESTOR) 5 MG tablet Take 5 mg by mouth daily.   06/09/2019 at am  . tamsulosin (FLOMAX) 0.4 MG CAPS capsule Take 0.4 mg by  mouth daily after supper.    06/08/2019 at pm  . cyclobenzaprine (FLEXERIL) 10 MG tablet Take 1 tablet (10 mg total) by mouth 3 (three) times daily as needed for muscle spasms. (Patient not taking: Reported on 06/09/2019) 50 tablet 0 Not Taking at Unknown time   Scheduled: . atenolol  50 mg Oral Daily  . bethanechol  25 mg Oral Daily  . docusate sodium  100 mg Oral BID  . enoxaparin (LOVENOX) injection  40 mg Subcutaneous Q24H  . ferrous sulfate  325 mg Oral BID WC  . furosemide  20 mg Intravenous Once  . lisinopril  20 mg Oral Daily  . rifampin  300 mg Oral Q12H  . rosuvastatin  5 mg Oral Daily  . sodium chloride flush  3 mL Intravenous Once  . sodium chloride  1 g Oral Q8H  . tamsulosin  0.4 mg Oral QPC supper   Continuous: .  ceFAZolin (ANCEF) IV 2 g (06/12/19 1305)   [id number], alum & mag hydroxide-simeth, oxyCODONE Anti-infectives (From admission, onward)   Start     Dose/Rate Route Frequency Ordered Stop   06/12/19 1000  rifampin (RIFADIN) capsule 300 mg     300 mg Oral Every 12 hours 06/12/19 0912     06/10/19 2200  ceFAZolin (ANCEF) IVPB 2g/100 mL premix     2 g 200 mL/hr over 30 Minutes Intravenous Every 8 hours 06/10/19 2041         Results for orders placed or performed during the hospital encounter of 06/09/19 (from the past 48 hour(s))  Renal function panel     Status: Abnormal   Collection Time: 06/10/19  4:41 PM  Result Value Ref Range   Sodium 121 (L) 135 - 145 mmol/L   Potassium 4.0 3.5 - 5.1 mmol/L   Chloride 86 (L) 98 - 111 mmol/L   CO2 20 (L) 22 - 32 mmol/L   Glucose, Bld 153 (H) 70 - 99 mg/dL   BUN 16 8 - 23 mg/dL   Creatinine, Ser 0.97 0.61 - 1.24 mg/dL   Calcium 8.4 (L) 8.9 - 10.3 mg/dL   Phosphorus 2.9 2.5 - 4.6 mg/dL   Albumin 2.7 (L) 3.5 - 5.0 g/dL   GFR calc non Af Amer >60 >60 mL/min   GFR calc Af Amer >60 >60 mL/min   Anion gap 15 5 - 15    Comment: Performed at [facility], [address] [place], [place] [id number]  Basic  metabolic panel     Status: Abnormal   Collection Time: 06/10/19  6:21 PM  Result Value Ref Range   Sodium 120 (L) 135 - 145 mmol/L   Potassium 4.4 3.5 - 5.1 mmol/L   Chloride  84 (L) 98 - 111 mmol/L   CO2 22 22 - 32 mmol/L   Glucose, Bld 179 (H) 70 - 99 mg/dL   BUN 16 8 - 23 mg/dL   Creatinine, Ser 0.89 0.61 - 1.24 mg/dL   Calcium 8.5 (L) 8.9 - 10.3 mg/dL   GFR calc non Af Amer >60 >60 mL/min   GFR calc Af Amer >60 >60 mL/min   Anion gap 14 5 - 15    Comment: Performed at [facility] [address], [place], [place] [id number]  Basic metabolic panel     Status: Abnormal   Collection Time: 06/11/19 12:26 AM  Result Value Ref Range   Sodium 123 (L) 135 - 145 mmol/L   Potassium 3.9 3.5 - 5.1 mmol/L   Chloride 87 (L) 98 - 111 mmol/L   CO2 24 22 - 32 mmol/L   Glucose, Bld 126 (H) 70 - 99 mg/dL   BUN 16 8 - 23 mg/dL   Creatinine, Ser 0.91 0.61 - 1.24 mg/dL   Calcium 8.6 (L) 8.9 - 10.3 mg/dL   GFR calc non Af Amer >60 >60 mL/min   GFR calc Af Amer >60 >60 mL/min   Anion gap 12 5 - 15    Comment: Performed at [facility], [place] [address] [place], [place] [id number]  Basic metabolic panel     Status: Abnormal   Collection Time: 06/11/19  6:51 AM  Result Value Ref Range   Sodium 124 (L) 135 - 145 mmol/L   Potassium 3.9 3.5 - 5.1 mmol/L   Chloride 88 (L) 98 - 111 mmol/L   CO2 22 22 - 32 mmol/L   Glucose, Bld 119 (H) 70 - 99 mg/dL   BUN 14 8 - 23 mg/dL   Creatinine, Ser 0.95 0.61 - 1.24 mg/dL   Calcium 8.7 (L) 8.9 - 10.3 mg/dL   GFR calc non Af Amer >60 >60 mL/min   GFR calc Af Amer >60 >60 mL/min   Anion gap 14 5 - 15    Comment: Performed at [facility], [place] [address] [place], [place] [address]  Sodium     Status: Abnormal   Collection Time: 06/11/19  1:03 PM  Result Value Ref Range   Sodium 121 (L) 135 - 145 mmol/L    Comment: Performed at [facility], [place] [address] [place], [place] [address]  Culture, blood (routine x 2)     Status: None  (Preliminary result)   Collection Time: 06/11/19  1:03 PM   Specimen: BLOOD  Result Value Ref Range   Specimen Description BLOOD LEFT ANTECUBITAL    Special Requests AEROBIC BOTTLE ONLY Blood Culture adequate volume    Culture      NO GROWTH < 24 HOURS Performed at [facility], [place] [address] [place], [place] [address]    Report Status PENDING   Culture, blood (routine x 2)     Status: None (Preliminary result)   Collection Time: 06/11/19  1:03 PM   Specimen: BLOOD  Result Value Ref Range   Specimen Description BLOOD LEFT ANTECUBITAL    Special Requests AEROBIC BOTTLE ONLY Blood Culture adequate volume    Culture      NO GROWTH < 24 HOURS Performed at [facility], [place] [address], [place], [place] [address]    Report Status PENDING   Sodium     Status: Abnormal   Collection Time: 06/11/19  5:13 PM  Result Value Ref Range   Sodium 122 (L) 135 - 145 mmol/L    Comment: Performed at [facility] [address], [place], [place] [address]  Sodium     Status: Abnormal   Collection Time: 06/11/19 11:24 PM  Result Value Ref Range   Sodium 123 (L) 135 - 145 mmol/L    Comment: Performed at [facility], [place] [address], [place], [place] [id number]  Basic metabolic panel     Status: Abnormal   Collection Time: 06/12/19  5:29 AM  Result Value Ref Range   Sodium 124 (L) 135 - 145 mmol/L   Potassium 4.4 3.5 - 5.1 mmol/L   Chloride 88 (L) 98 - 111 mmol/L   CO2 23 22 - 32 mmol/L   Glucose, Bld 108 (H) 70 - 99 mg/dL   BUN 11 8 - 23 mg/dL   Creatinine, Ser 0.91 0.61 - 1.24 mg/dL   Calcium 8.7 (L) 8.9 - 10.3 mg/dL   GFR calc non Af Amer >60 >60 mL/min   GFR calc Af Amer >60 >60 mL/min   Anion gap 13 5 - 15    Comment: Performed at [facility], [place] [address], [place], [place] [address]  Osmolality     Status: Abnormal   Collection Time: 06/12/19  5:29 AM  Result Value Ref Range   Osmolality 257 (L) 275 - 295 mOsm/kg    Comment: Performed at [facility], [place] [address], [place], [place] [address]  Sodium     Status: Abnormal   Collection Time: 06/12/19 11:25 AM  Result Value Ref Range   Sodium 122 (L) 135 - 145 mmol/L    Comment: Performed at [facility] [address] [place], [place] [address]    Mr Lumbar Spine Wo Contrast  Result Date: 06/11/2019 CLINICAL DATA:  83 year old male status post L3-L4 fusion last month. Back pain with infection suspected. EXAM: MRI LUMBAR SPINE WITHOUT CONTRAST TECHNIQUE: Multiplanar, multisequence MR imaging of the lumbar spine was performed. No intravenous contrast was administered. COMPARISON:  Preoperative lumbar MRI 05/13/2019. FINDINGS: Segmentation: Lumbar segmentation appears to be normal and is the same numbering system used in October. Alignment: Regressed grade 1 spondylolisthesis at L3-L4 following surgery. Stable mild retrolisthesis at L1-L2. Stable vertebral height and alignment otherwise. Vertebrae: Continued mild degenerative appearing endplate marrow edema in the lower thoracic spine at T11-T12 and T12-L1. Hardware susceptibility artifact at L3-L4. Posterior decompression at that level. No convincing marrow edema or evidence of acute osseous abnormality. Intact visible sacrum and SI joints. Conus medullaris and cauda equina: Conus extends to the T12-L1 level. No lower spinal cord or conus signal abnormality. Paraspinal and other soft tissues: Stable visible abdominal viscera. Confluent STIR hyperintensity in the lumbar rect or spine I muscles and throughout the operative bed at the L3-L4 level (series 6 images 6-10). At the laminectomy site on series 8, image 14 there appears to be generalized soft tissue edema. There is mild associated mass effect on the thecal sac (see below). However, in the absence of IV contrast there is no discrete paraspinal fluid collection. The psoas musculature remains normal. Disc levels: Unchanged degeneration from the lower thoracic spine through L1-L2.  L2-L3: Stable circumferential disc bulge. Mildly increased posterior element hypertrophy. Subsequent increased mild to moderate spinal stenosis (series 8, image 9). Stable mild foraminal stenosis greater on the right. L3-L4: Interval decompression and fusion. Edematous posterior soft tissue with mild mass effect on the thecal  sac resulting in mild spinal stenosis (series 8, image 15), although thecal sac patency has increased since 05/13/2019. Superimposed granulation tissue including at the right lateral recess and right L3 neural foramen. L4-L5 and L5-S1 levels are stable. IMPRESSION: 1. Extensive soft tissue edema in and around the L3-L4 laminectomy site does raise the possibility of a soft tissue infection. But there is no abscess or fluid collection identified in the absence of IV contrast, and no evidence of osteomyelitis. 2. Soft tissue edema resulting in mild mass effect on the posterior thecal sac at L3-L4. Mild spinal stenosis there although thecal sac patency has improved since the preoperative MRI. 3. Increased mild to moderate multifactorial spinal stenosis at L2-L3 appears related to interval posterior element hypertrophy. 4. Other lumbar levels are stable. Electronically Signed   By: Genevie Ann M.D.   On: 06/11/2019 22:59    ROS: As above Blood pressure 134/78, pulse 70, temperature 99.5 F (37.5 C), temperature source Oral, resp. rate 18, weight 67.6 kg, SpO2 98 %. Estimated body mass index is 21.38 kg/m as calculated from the following:   Height as of 05/20/19: 5\' 10"  (1.778 m).   Weight as of this encounter: 67.6 kg.  Physical Exam  General: An alert and pleasant 83 year old white male in no apparent distress.  Neurologic exam: The patient is alert and oriented x3.  His strength is grossly normal.  Wound: The patient's wound has a small area of superficial dehiscence.  There is no erythema or drainage.  Lumbar MRI I reviewed the patient's lumbar MRI performed 06/11/2019.  There is  some inflammation in the soft tissues.  He has some mild stenosis at 3 4 which is likely secondary to postoperative changes.  I do not see any large fluid collections, osteomyelitis, abscess, etc.  Assessment/Plan: Positive blood cultures: I have discussed the situation with the patient and his son.  I have told him his wound looks "pretty good".  Is not surprising to me that his MRI demonstrates tissue swelling so soon after surgery.  I do not think he needs further surgery at this point.  We will continue to observe his wound.  I will leave it up to Dr. Storm Frisk expertise which antibiotic and for how long.  Hyponatremia: I appreciate the hospitalist care.  He looks better today.  Ophelia Charter 06/12/2019, 2:33 PM

## 2019-06-12 NOTE — Progress Notes (Signed)
[facility]    Date of Admission:  06/09/2019   Total days of antibiotics 2          ID: David Bird is a 83 y.o. male  with hx of HTN, spinal stenosis, CVA, HL, DM2, hx lymphoma, CAD h/o stent x 1 had back surgery on 10/26  Active Problems:   Acute hyponatremia   Generalized weakness   Essential hypertension    Subjective: Af, underwent non contrast mri of lumbar spine last night. Slept well. Some itching to the incision site. No flank pain  Medications:  . atenolol  50 mg Oral Daily  . bethanechol  25 mg Oral Daily  . docusate sodium  100 mg Oral BID  . enoxaparin (LOVENOX) injection  40 mg Subcutaneous Q24H  . ferrous sulfate  325 mg Oral BID WC  . lisinopril  20 mg Oral Daily  . rifampin  300 mg Oral Q12H  . rosuvastatin  5 mg Oral Daily  . sodium chloride flush  3 mL Intravenous Once  . sodium chloride  1 g Oral Q8H  . tamsulosin  0.4 mg Oral QPC supper    Objective: Vital signs in last 24 hours: Temp:  [98.2 F (36.8 C)-100.4 F (38 C)] 98.2 F (36.8 C) (11/18 0900) Pulse Rate:  [67-75] 73 (11/18 0900) Resp:  [14-18] 14 (11/18 0900) BP: (130-163)/(70-85) 130/70 (11/18 0900) SpO2:  [97 %-99 %] 97 % (11/18 0900) Weight:  [67.6 kg] 67.6 kg (11/18 0459)  Physical Exam  Constitutional: He is oriented to person, place, and time. He appears well-developed and well-nourished. No distress.  HENT:  Mouth/Throat: Oropharynx is clear and moist. No oropharyngeal exudate.  Cardiovascular: Normal rate, regular rhythm and normal heart sounds. Exam reveals no gallop and no friction rub.  No murmur heard.  Pulmonary/Chest: Effort normal and breath sounds normal. No respiratory distress. He has no wheezes.  Abdominal: Soft. Bowel sounds are normal. He exhibits no distension. There is no tenderness.  Lymphadenopathy:  He has no cervical adenopathy.  Neurological: He is alert and oriented to person, place, and time.  Skin: Skin is warm and  dry. Back incision not draining Psychiatric: He has a normal mood and affect. His behavior is normal.     Lab Results Recent Labs    06/09/19 1557 06/09/19 2055 06/10/19 0017  06/11/19 0651  06/11/19 2324 06/12/19 0529  WBC 7.3  --  5.4  --   --   --   --   --   HGB 11.1*  --  10.2*  --   --   --   --   --   HCT 32.1* 29.3* 28.7*  --   --   --   --   --   NA 117* 122* 124*   < > 124*   < > 123* 124*  K 4.3 3.7 3.9   < > 3.9  --   --  4.4  CL 81* 87* 90*   < > 88*  --   --  88*  CO2 19* 21* 22   < > 22  --   --  23  BUN 20 18 18    < > 14  --   --  11  CREATININE 1.22 1.00 0.94   < > 0.95  --   --  0.91   < > = values in this interval not displayed.   Liver Panel Recent Labs    06/09/19  1557 06/10/19 0506 06/10/19 1641  PROT 6.6 6.9  --   ALBUMIN 3.0* 2.9* 2.7*  AST 58* 35  --   ALT 45* 39  --   ALKPHOS 93 94  --   BILITOT 0.6 0.7  --     Microbiology: reviewed Studies/Results: Mr Lumbar Spine Wo Contrast  Result Date: 06/11/2019 CLINICAL DATA:  83 year old male status post L3-L4 fusion last month. Back pain with infection suspected. EXAM: MRI LUMBAR SPINE WITHOUT CONTRAST TECHNIQUE: Multiplanar, multisequence MR imaging of the lumbar spine was performed. No intravenous contrast was administered. COMPARISON:  Preoperative lumbar MRI 05/13/2019. FINDINGS: Segmentation: Lumbar segmentation appears to be normal and is the same numbering system used in October. Alignment: Regressed grade 1 spondylolisthesis at L3-L4 following surgery. Stable mild retrolisthesis at L1-L2. Stable vertebral height and alignment otherwise. Vertebrae: Continued mild degenerative appearing endplate marrow edema in the lower thoracic spine at T11-T12 and T12-L1. Hardware susceptibility artifact at L3-L4. Posterior decompression at that level. No convincing marrow edema or evidence of acute osseous abnormality. Intact visible sacrum and SI joints. Conus medullaris and cauda equina: Conus extends to the  T12-L1 level. No lower spinal cord or conus signal abnormality. Paraspinal and other soft tissues: Stable visible abdominal viscera. Confluent STIR hyperintensity in the lumbar rect or spine I muscles and throughout the operative bed at the L3-L4 level (series 6 images 6-10). At the laminectomy site on series 8, image 14 there appears to be generalized soft tissue edema. There is mild associated mass effect on the thecal sac (see below). However, in the absence of IV contrast there is no discrete paraspinal fluid collection. The psoas musculature remains normal. Disc levels: Unchanged degeneration from the lower thoracic spine through L1-L2. L2-L3: Stable circumferential disc bulge. Mildly increased posterior element hypertrophy. Subsequent increased mild to moderate spinal stenosis (series 8, image 9). Stable mild foraminal stenosis greater on the right. L3-L4: Interval decompression and fusion. Edematous posterior soft tissue with mild mass effect on the thecal sac resulting in mild spinal stenosis (series 8, image 15), although thecal sac patency has increased since 05/13/2019. Superimposed granulation tissue including at the right lateral recess and right L3 neural foramen. L4-L5 and L5-S1 levels are stable. IMPRESSION: 1. Extensive soft tissue edema in and around the L3-L4 laminectomy site does raise the possibility of a soft tissue infection. But there is no abscess or fluid collection identified in the absence of IV contrast, and no evidence of osteomyelitis. 2. Soft tissue edema resulting in mild mass effect on the posterior thecal sac at L3-L4. Mild spinal stenosis there although thecal sac patency has improved since the preoperative MRI. 3. Increased mild to moderate multifactorial spinal stenosis at L2-L3 appears related to interval posterior element hypertrophy. 4. Other lumbar levels are stable. Electronically Signed   By: Genevie Ann M.D.   On: 06/11/2019 22:59     Assessment/Plan: MSSA bacteremia =  mri shows some soft tissue edema to l3-l4. Unclear if this is more than expected this far from original surgery. Dr Arnoldo Morale to evaluate  Will add rifampin in the event to prevent biofilm  May still need TEE for evaluation of endocarditis.  Will need to check sed rate, crp, and lfts due to rifampin  Hyponatremia = unchanged  [facility] Cell: [phone] Pager: [phone]  06/12/2019, 10:24 AM

## 2019-06-12 NOTE — Progress Notes (Signed)
[facility] Progress Note    Assessment/ Plan:   1. Hyponatremia:  Initially looked like hypotonic hyponatremia, exacerbated by HCTZ, after volume resuscitation mixed pic developed with SIADH as well.  SIADH predominating at present- fluid restrict and salt tabs continue--> stalling out at 124 this AM, add low-dose Lasix to drive Uosms down, serial na checks, may need to try something like demeclocycline.  2. MSSA bacteremia: on cefazolin, rifampin added-- MRI without hardware infection, ID on board, possible TEE   3. HTN - avoid acei/ thiazide for now, any other agents ok 4. SP recent lumbar decompression surg 5. HL 6. DM on oral agents 7. Dispo: pending rx plan for MSSA bacteremia and better Na  Subjective:    MRI yesterday without hardware infection.     Objective:   BP 130/70 (BP Location: Left Arm)   Pulse 73   Temp 98.2 F (36.8 C) (Oral)   Resp 14   Wt 67.6 kg   SpO2 97%   BMI 21.38 kg/m   Intake/Output Summary (Last 24 hours) at 06/12/2019 1030 Last data filed at 06/12/2019 0601 Gross per 24 hour  Intake 780 ml  Output 1025 ml  Net -245 ml   Weight change:   Physical Exam: Gen: older man NAD CVS: RRR no m/r/g Resp: clear bilaterally Abd: soft Ext: no LE edema NEURO: AAO x 3  Imaging: Mr Lumbar Spine Wo Contrast  Result Date: 06/11/2019 CLINICAL DATA:  83 year old male status post L3-L4 fusion last month. Back pain with infection suspected. EXAM: MRI LUMBAR SPINE WITHOUT CONTRAST TECHNIQUE: Multiplanar, multisequence MR imaging of the lumbar spine was performed. No intravenous contrast was administered. COMPARISON:  Preoperative lumbar MRI 05/13/2019. FINDINGS: Segmentation: Lumbar segmentation appears to be normal and is the same numbering system used in October. Alignment: Regressed grade 1 spondylolisthesis at L3-L4 following surgery. Stable mild retrolisthesis at L1-L2. Stable vertebral height and alignment otherwise. Vertebrae: Continued  mild degenerative appearing endplate marrow edema in the lower thoracic spine at T11-T12 and T12-L1. Hardware susceptibility artifact at L3-L4. Posterior decompression at that level. No convincing marrow edema or evidence of acute osseous abnormality. Intact visible sacrum and SI joints. Conus medullaris and cauda equina: Conus extends to the T12-L1 level. No lower spinal cord or conus signal abnormality. Paraspinal and other soft tissues: Stable visible abdominal viscera. Confluent STIR hyperintensity in the lumbar rect or spine I muscles and throughout the operative bed at the L3-L4 level (series 6 images 6-10). At the laminectomy site on series 8, image 14 there appears to be generalized soft tissue edema. There is mild associated mass effect on the thecal sac (see below). However, in the absence of IV contrast there is no discrete paraspinal fluid collection. The psoas musculature remains normal. Disc levels: Unchanged degeneration from the lower thoracic spine through L1-L2. L2-L3: Stable circumferential disc bulge. Mildly increased posterior element hypertrophy. Subsequent increased mild to moderate spinal stenosis (series 8, image 9). Stable mild foraminal stenosis greater on the right. L3-L4: Interval decompression and fusion. Edematous posterior soft tissue with mild mass effect on the thecal sac resulting in mild spinal stenosis (series 8, image 15), although thecal sac patency has increased since 05/13/2019. Superimposed granulation tissue including at the right lateral recess and right L3 neural foramen. L4-L5 and L5-S1 levels are stable. IMPRESSION: 1. Extensive soft tissue edema in and around the L3-L4 laminectomy site does raise the possibility of a soft tissue infection. But there is no abscess or fluid collection identified in the absence  of IV contrast, and no evidence of osteomyelitis. 2. Soft tissue edema resulting in mild mass effect on the posterior thecal sac at L3-L4. Mild spinal stenosis  there although thecal sac patency has improved since the preoperative MRI. 3. Increased mild to moderate multifactorial spinal stenosis at L2-L3 appears related to interval posterior element hypertrophy. 4. Other lumbar levels are stable. Electronically Signed   By: Genevie Ann M.D.   On: 06/11/2019 22:59    Labs: BMET Recent Labs  Lab 06/10/19 0909 06/10/19 1152 06/10/19 1641 06/10/19 1821 06/11/19 0026 06/11/19 0651 06/11/19 1303 06/11/19 1713 06/11/19 2324 06/12/19 0529  NA 123* 121* 121* 120* 123* 124* 121* 122* 123* 124*  K 4.0 4.0 4.0 4.4 3.9 3.9  --   --   --  4.4  CL 87* 84* 86* 84* 87* 88*  --   --   --  88*  CO2 21* 22 20* 22 24 22   --   --   --  23  GLUCOSE 183* 102* 153* 179* 126* 119*  --   --   --  108*  BUN 15 15 16 16 16 14   --   --   --  11  CREATININE 0.96 0.73 0.97 0.89 0.91 0.95  --   --   --  0.91  CALCIUM 8.7* 8.9 8.4* 8.5* 8.6* 8.7*  --   --   --  8.7*  PHOS  --   --  2.9  --   --   --   --   --   --   --    CBC Recent Labs  Lab 06/09/19 1557 06/09/19 2055 06/10/19 0017  WBC 7.3  --  5.4  HGB 11.1*  --  10.2*  HCT 32.1* 29.3* 28.7*  MCV 85.6  --  83.7  PLT 292  --  248    Medications:    . atenolol  50 mg Oral Daily  . bethanechol  25 mg Oral Daily  . docusate sodium  100 mg Oral BID  . enoxaparin (LOVENOX) injection  40 mg Subcutaneous Q24H  . ferrous sulfate  325 mg Oral BID WC  . lisinopril  20 mg Oral Daily  . rifampin  300 mg Oral Q12H  . rosuvastatin  5 mg Oral Daily  . sodium chloride flush  3 mL Intravenous Once  . sodium chloride  1 g Oral Q8H  . tamsulosin  0.4 mg Oral QPC supper      Madelon Lips MD 06/12/2019, 10:30 AM

## 2019-06-13 DIAGNOSIS — K5900 Constipation, unspecified: Secondary | ICD-10-CM

## 2019-06-13 LAB — COMPREHENSIVE METABOLIC PANEL
ALT: 21 U/L (ref 0–44)
AST: 26 U/L (ref 15–41)
Albumin: 2.6 g/dL — ABNORMAL LOW (ref 3.5–5.0)
Alkaline Phosphatase: 92 U/L (ref 38–126)
Anion gap: 15 (ref 5–15)
BUN: 18 mg/dL (ref 8–23)
CO2: 23 mmol/L (ref 22–32)
Calcium: 8.8 mg/dL — ABNORMAL LOW (ref 8.9–10.3)
Chloride: 87 mmol/L — ABNORMAL LOW (ref 98–111)
Creatinine, Ser: 1.03 mg/dL (ref 0.61–1.24)
GFR calc Af Amer: >60 mL/min (ref >60)
GFR calc non Af Amer: >60 mL/min (ref >60)
Glucose, Bld: 113 mg/dL — ABNORMAL HIGH (ref 70–99)
Potassium: 4 mmol/L (ref 3.5–5.1)
Sodium: 125 mmol/L — ABNORMAL LOW (ref 135–145)
Total Bilirubin: 1.3 mg/dL — ABNORMAL HIGH (ref 0.3–1.2)
Total Protein: 6.4 g/dL — ABNORMAL LOW (ref 6.5–8.1)

## 2019-06-13 MED ORDER — FUROSEMIDE 10 MG/ML IJ SOLN
40.0000 mg | Freq: Two times a day (BID) | INTRAMUSCULAR | Status: DC
  Administered 2019-06-13 – 2019-06-14 (×2): 40 mg via INTRAVENOUS
  Filled 2019-06-13 (×2): qty 4

## 2019-06-13 MED ORDER — FUROSEMIDE 10 MG/ML IJ SOLN
20.0000 mg | Freq: Two times a day (BID) | INTRAMUSCULAR | Status: DC
  Administered 2019-06-13: 20 mg via INTRAVENOUS
  Filled 2019-06-13: qty 2

## 2019-06-13 MED ORDER — MENTHOL 3 MG MT LOZG
1.0000 | LOZENGE | OROMUCOSAL | Status: DC | PRN
  Filled 2019-06-13: qty 9

## 2019-06-13 MED ORDER — MAGNESIUM HYDROXIDE 400 MG/5ML PO SUSP
30.0000 mL | Freq: Once | ORAL | Status: AC
  Administered 2019-06-13: 30 mL via ORAL
  Filled 2019-06-13 (×2): qty 30

## 2019-06-13 NOTE — Progress Notes (Signed)
Subjective: The patient is alert and pleasant.  His son is at the bedside.  Objective: Vital signs in last 24 hours: Temp:  [98.2 F (36.8 C)-99.5 F (37.5 C)] 98.7 F (37.1 C) (11/19 0900) Pulse Rate:  [65-76] 65 (11/19 0900) Resp:  [17-18] 18 (11/19 0900) BP: (127-134)/(67-79) 127/67 (11/19 0900) SpO2:  [97 %-99 %] 99 % (11/19 0900) Weight:  [67.7 kg] 67.7 kg (11/18 2101) Estimated body mass index is 21.42 kg/m as calculated from the following:   Height as of 05/20/19: 5\' 10"  (1.778 m).   Weight as of this encounter: 67.7 kg.   Intake/Output from previous day: 11/18 0701 - 11/19 0700 In: 987.8 [P.O.:740; IV Piggyback:247.8] Out: 3150 [Urine:3150] Intake/Output this shift: Total I/O In: 240 [P.O.:240] Out: 100 [Urine:100]  Physical exam the patient is alert and oriented.  His lower extremity strength is normal.  His dressing is clean and dry.  Lab Results: No results for input(s): WBC, HGB, HCT, PLT in the last 72 hours. BMET Recent Labs    06/12/19 0529  06/12/19 1827 06/13/19 0506  NA 124*   < > 125* 125*  K 4.4  --   --  4.0  CL 88*  --   --  87*  CO2 23  --   --  23  GLUCOSE 108*  --   --  113*  BUN 11  --   --  18  CREATININE 0.91  --   --  1.03  CALCIUM 8.7*  --   --  8.8*   < > = values in this interval not displayed.    Studies/Results: Mr Lumbar Spine Wo Contrast  Result Date: 06/11/2019 CLINICAL DATA:  83 year old male status post L3-L4 fusion last month. Back pain with infection suspected. EXAM: MRI LUMBAR SPINE WITHOUT CONTRAST TECHNIQUE: Multiplanar, multisequence MR imaging of the lumbar spine was performed. No intravenous contrast was administered. COMPARISON:  Preoperative lumbar MRI 05/13/2019. FINDINGS: Segmentation: Lumbar segmentation appears to be normal and is the same numbering system used in October. Alignment: Regressed grade 1 spondylolisthesis at L3-L4 following surgery. Stable mild retrolisthesis at L1-L2. Stable vertebral height and  alignment otherwise. Vertebrae: Continued mild degenerative appearing endplate marrow edema in the lower thoracic spine at T11-T12 and T12-L1. Hardware susceptibility artifact at L3-L4. Posterior decompression at that level. No convincing marrow edema or evidence of acute osseous abnormality. Intact visible sacrum and SI joints. Conus medullaris and cauda equina: Conus extends to the T12-L1 level. No lower spinal cord or conus signal abnormality. Paraspinal and other soft tissues: Stable visible abdominal viscera. Confluent STIR hyperintensity in the lumbar rect or spine I muscles and throughout the operative bed at the L3-L4 level (series 6 images 6-10). At the laminectomy site on series 8, image 14 there appears to be generalized soft tissue edema. There is mild associated mass effect on the thecal sac (see below). However, in the absence of IV contrast there is no discrete paraspinal fluid collection. The psoas musculature remains normal. Disc levels: Unchanged degeneration from the lower thoracic spine through L1-L2. L2-L3: Stable circumferential disc bulge. Mildly increased posterior element hypertrophy. Subsequent increased mild to moderate spinal stenosis (series 8, image 9). Stable mild foraminal stenosis greater on the right. L3-L4: Interval decompression and fusion. Edematous posterior soft tissue with mild mass effect on the thecal sac resulting in mild spinal stenosis (series 8, image 15), although thecal sac patency has increased since 05/13/2019. Superimposed granulation tissue including at the right lateral recess and right L3 neural  foramen. L4-L5 and L5-S1 levels are stable. IMPRESSION: 1. Extensive soft tissue edema in and around the L3-L4 laminectomy site does raise the possibility of a soft tissue infection. But there is no abscess or fluid collection identified in the absence of IV contrast, and no evidence of osteomyelitis. 2. Soft tissue edema resulting in mild mass effect on the posterior  thecal sac at L3-L4. Mild spinal stenosis there although thecal sac patency has improved since the preoperative MRI. 3. Increased mild to moderate multifactorial spinal stenosis at L2-L3 appears related to interval posterior element hypertrophy. 4. Other lumbar levels are stable. Electronically Signed   By: Genevie Ann M.D.   On: 06/11/2019 22:59    Assessment/Plan: Staph bacteremia: The source is unclear.  His wound looks fine.  I agree that the best course of action is prolonged antibiotics per ID.  I have instructed the patient and his son to call my office for a follow-up appointment in a week or 2.  Please call if I can help.  LOS: 4 days     Ophelia Charter 06/13/2019, 11:39 AM

## 2019-06-13 NOTE — Progress Notes (Signed)
PHARMACY CONSULT NOTE FOR:  OUTPATIENT  PARENTERAL ANTIBIOTIC THERAPY (OPAT)  Indication: MSSA Bacteremia w/ concern for osteomyelitis Regimen: cefazolin 2g IV q8h + rifampin 300mg  PO BID End date: 07/23/2019  IV antibiotic discharge orders are pended. To discharging provider:  please sign these orders via discharge navigator,  Select New Orders & click on the button choice - Manage This Unsigned Work.     Thank you for allowing pharmacy to be a part of this patient's care.  Kennon Holter, PharmD PGY1 Ambulatory Care Pharmacy Resident [employer] Phone: [phone] 06/13/2019, 11:08 AM

## 2019-06-13 NOTE — Progress Notes (Signed)
[facility]  Date of Admission:  06/09/2019     Total days of antibiotics 3         ASSESSMENT:  Mr. Sedano was evaluated by neurosurgery with no recommendations for surgical intervention.  We will plan for treatment with antimicrobial therapy for MSSA bacteremia with likely source being his lumbar spine.  Repeat cultures have been without growth to date.  Discussed plan of care with Mr. Bautch and his son of 6 weeks of IV antibiotics with Ancef and rifampin.  Recommend PICC line placement in 24 hours if cultures remain negative.  Outpatient antibiotic orders placed below.  We will plan to follow-up in ID office in 3 to 4 weeks.  PLAN:  1. Continue current dose of cefazolin and rifampin. 2. PICC line in the next 24 hours if cultures remain negative. 3. OPAT orders placed 4. Will follow up in 3-4 weeks in ID office.  Cameron for discharge from ID perspective in next 24 hours if cultures remain negative.   ID will be available as needed.   Diagnosis: MSSA bacteremia with concern for lumbar wound infection.  Culture Result: MSSA  Allergies  Allergen Reactions  . Penicillins Rash    Did it involve swelling of the face/tongue/throat, SOB, or low BP? No Did it involve sudden or severe rash/hives, skin peeling, or any reaction on the inside of your mouth or nose? No Did you need to seek medical attention at a hospital or doctor's office? Yes When did it last happen?40 years ago If all above answers are "NO", may proceed with cephalosporin use.     OPAT Orders Discharge antibiotics: Ancef and rifampin  Per pharmacy protocol  Aim for Vancomycin trough 15-20 or AUC 400-550 (unless otherwise indicated) Duration: 6 weeks  End Date: 07/23/19  [facility] Care Per Protocol:  Labs weekly while on IV antibiotics: _X_ CBC with differential __ BMP _X_ CMP _X_ CRP _X_ ESR __ Vancomycin trough __ CK  __ Please pull PIC at completion of IV antibiotics  __ Please leave PIC in place until doctor has seen patient or been notified  Fax weekly labs to [phone]  Clinic Follow Up Appt:  07/16/2019 at 9:45 AM with Terri Piedra, NP   Principal Problem:   MSSA bacteremia Active Problems:   Acute hyponatremia   Generalized weakness   Essential hypertension   . atenolol  50 mg Oral Daily  . bethanechol  25 mg Oral Daily  . docusate sodium  100 mg Oral BID  . enoxaparin (LOVENOX) injection  40 mg Subcutaneous Q24H  . ferrous sulfate  325 mg Oral BID WC  . furosemide  20 mg Intravenous BID  . lisinopril  20 mg Oral Daily  . magnesium hydroxide  30 mL Oral Once  . rifampin  300 mg Oral Q12H  . rosuvastatin  5 mg Oral Daily  . sodium chloride flush  3 mL Intravenous Once  . sodium chloride  1 g Oral Q8H  . tamsulosin  0.4 mg Oral QPC supper    SUBJECTIVE:  Afebrile overnight with no acute events. Feeling better today. Wanting to take a shower.   Allergies  Allergen Reactions  . Penicillins Rash    Did it involve swelling of the face/tongue/throat, SOB, or low BP? No Did it involve sudden or severe rash/hives, skin peeling, or any reaction on the inside of your mouth or nose? No Did you need to seek medical attention at a hospital or  doctor's office? Yes When did it last happen?40 years ago If all above answers are "NO", may proceed with cephalosporin use.      Review of Systems: Review of Systems  Constitutional: Negative for chills, fever and weight loss.  Respiratory: Negative for cough, shortness of breath and wheezing.   Cardiovascular: Negative for chest pain and leg swelling.  Gastrointestinal: Negative for abdominal pain, constipation, diarrhea, nausea and vomiting.  Skin: Negative for rash.    OBJECTIVE: Vitals:   06/12/19 1300 06/12/19 2101 06/13/19 0543 06/13/19 0900  BP: 134/78 131/78 133/79 127/67  Pulse: 70 66 76 65  Resp: 18 18 17 18   Temp: 99.5 F (37.5 C) 98.2 F (36.8 C) 98.2 F (36.8  C) 98.7 F (37.1 C)  TempSrc: Oral Oral  Oral  SpO2: 98% 97% 98% 99%  Weight:  67.7 kg     Body mass index is 21.42 kg/m.  Physical Exam Constitutional:      General: He is not in acute distress.    Appearance: He is well-developed.     Comments: Lying in bed; pleasant  Cardiovascular:     Rate and Rhythm: Normal rate and regular rhythm.     Heart sounds: Normal heart sounds.  Pulmonary:     Effort: Pulmonary effort is normal.     Breath sounds: Normal breath sounds.  Musculoskeletal:     Comments: Lumbar spine incision remains unchanged  Skin:    General: Skin is warm and dry.  Neurological:     Mental Status: He is alert and oriented to person, place, and time.  Psychiatric:        Behavior: Behavior normal.        Thought Content: Thought content normal.        Judgment: Judgment normal.     Lab Results Lab Results  Component Value Date   WBC 5.4 06/10/2019   HGB 10.2 (L) 06/10/2019   HCT 28.7 (L) 06/10/2019   MCV 83.7 06/10/2019   PLT 248 06/10/2019    Lab Results  Component Value Date   CREATININE 1.03 06/13/2019   BUN 18 06/13/2019   NA 125 (L) 06/13/2019   K 4.0 06/13/2019   CL 87 (L) 06/13/2019   CO2 23 06/13/2019    Lab Results  Component Value Date   ALT 21 06/13/2019   AST 26 06/13/2019   ALKPHOS 92 06/13/2019   BILITOT 1.3 (H) 06/13/2019     Microbiology: Recent Results (from the past 240 hour(s))  SARS CORONAVIRUS 2 (TAT 6-24 HRS) Nasopharyngeal Nasopharyngeal Swab     Status: None   Collection Time: 06/09/19  5:05 PM   Specimen: Nasopharyngeal Swab  Result Value Ref Range Status   SARS Coronavirus 2 NEGATIVE NEGATIVE Final    Comment: (NOTE) SARS-CoV-2 target nucleic acids are NOT DETECTED. The SARS-CoV-2 RNA is generally detectable in upper and lower respiratory specimens during the acute phase of infection. Negative results do not preclude SARS-CoV-2 infection, do not rule out co-infections with other pathogens, and should not be  used as the sole basis for treatment or other patient management decisions. Negative results must be combined with clinical observations, patient history, and epidemiological information. The expected result is Negative. Fact Sheet for Patients: SugarRoll.be Fact Sheet for Healthcare Providers: https://www.woods-mathews.com/ This test is not yet approved or cleared by the [place] FDA and  has been authorized for detection and/or diagnosis of SARS-CoV-2 by FDA under an Emergency Use Authorization (EUA). This EUA will remain  in  effect (meaning this test can be used) for the duration of the COVID-19 declaration under Section 56 4(b)(1) of the Act, 21 U.S.C. section 360bbb-3(b)(1), unless the authorization is terminated or revoked sooner. Performed at [facility], [place] [address], [place], [place] [address]   Culture, Urine     Status: None   Collection Time: 06/09/19  8:40 PM   Specimen: Urine, Random  Result Value Ref Range Status   Specimen Description URINE, RANDOM  Final   Special Requests NONE  Final   Culture   Final    NO GROWTH Performed at [facility], [place] [address], [place], [place] [address]    Report Status 06/10/2019 FINAL  Final  Culture, blood (routine x 2)     Status: Abnormal   Collection Time: 06/09/19  8:55 PM   Specimen: BLOOD LEFT ARM  Result Value Ref Range Status   Specimen Description BLOOD LEFT ARM  Final   Special Requests   Final    BOTTLES DRAWN AEROBIC AND ANAEROBIC Blood Culture adequate volume   Culture  Setup Time   Final    GRAM POSITIVE COCCI IN BOTH AEROBIC AND ANAEROBIC BOTTLES CRITICAL RESULT CALLED TO, READ BACK BY AND VERIFIED WITH: Hughie Closs [facility] 06/10/19 2036 JDW Performed at [facility], [place] [address], [place], [place] [address]    Culture STAPHYLOCOCCUS AUREUS (A)  Final   Report Status 06/12/2019 FINAL  Final   Organism ID, Bacteria STAPHYLOCOCCUS AUREUS  Final       Susceptibility   Staphylococcus aureus - MIC*    CIPROFLOXACIN <=0.5 SENSITIVE Sensitive     ERYTHROMYCIN >=8 RESISTANT Resistant     GENTAMICIN <=0.5 SENSITIVE Sensitive     OXACILLIN 0.5 SENSITIVE Sensitive     TETRACYCLINE <=1 SENSITIVE Sensitive     VANCOMYCIN <=0.5 SENSITIVE Sensitive     TRIMETH/SULFA <=10 SENSITIVE Sensitive     CLINDAMYCIN RESISTANT Resistant     RIFAMPIN <=0.5 SENSITIVE Sensitive     Inducible Clindamycin POSITIVE Resistant     * STAPHYLOCOCCUS AUREUS  Blood Culture ID Panel (Reflexed)     Status: Abnormal   Collection Time: 06/09/19  8:55 PM  Result Value Ref Range Status   Enterococcus species NOT DETECTED NOT DETECTED Final   Listeria monocytogenes NOT DETECTED NOT DETECTED Final   Staphylococcus species DETECTED (A) NOT DETECTED Final    Comment: CRITICAL RESULT CALLED TO, READ BACK BY AND VERIFIED WITH: Hughie Closs [facility] 06/10/19 2036 JDW    Staphylococcus aureus (BCID) DETECTED (A) NOT DETECTED Final    Comment: Methicillin (oxacillin) susceptible Staphylococcus aureus (MSSA). Preferred therapy is anti staphylococcal beta lactam antibiotic (Cefazolin or Nafcillin), unless clinically contraindicated. CRITICAL RESULT CALLED TO, READ BACK BY AND VERIFIED WITH: Hughie Closs [facility] 06/10/19 2036 JDW    Methicillin resistance NOT DETECTED NOT DETECTED Final   Streptococcus species NOT DETECTED NOT DETECTED Final   Streptococcus agalactiae NOT DETECTED NOT DETECTED Final   Streptococcus pneumoniae NOT DETECTED NOT DETECTED Final   Streptococcus pyogenes NOT DETECTED NOT DETECTED Final   Acinetobacter baumannii NOT DETECTED NOT DETECTED Final   Enterobacteriaceae species NOT DETECTED NOT DETECTED Final   Enterobacter cloacae complex NOT DETECTED NOT DETECTED Final   Escherichia coli NOT DETECTED NOT DETECTED Final   Klebsiella oxytoca NOT DETECTED NOT DETECTED Final   Klebsiella pneumoniae NOT DETECTED NOT DETECTED Final   Proteus species NOT DETECTED NOT  DETECTED Final   Serratia marcescens NOT DETECTED NOT DETECTED  Final   Haemophilus influenzae NOT DETECTED NOT DETECTED Final   Neisseria meningitidis NOT DETECTED NOT DETECTED Final   Pseudomonas aeruginosa NOT DETECTED NOT DETECTED Final   Candida albicans NOT DETECTED NOT DETECTED Final   Candida glabrata NOT DETECTED NOT DETECTED Final   Candida krusei NOT DETECTED NOT DETECTED Final   Candida parapsilosis NOT DETECTED NOT DETECTED Final   Candida tropicalis NOT DETECTED NOT DETECTED Final    Comment: Performed at [facility], [place] [address] [place], [place] [address]  Culture, blood (routine x 2)     Status: Abnormal   Collection Time: 06/09/19  9:00 PM   Specimen: BLOOD LEFT HAND  Result Value Ref Range Status   Specimen Description BLOOD LEFT HAND  Final   Special Requests   Final    BOTTLES DRAWN AEROBIC ONLY Blood Culture adequate volume   Culture  Setup Time   Final    GRAM POSITIVE COCCI AEROBIC BOTTLE ONLY CRITICAL VALUE NOTED.  VALUE IS CONSISTENT WITH PREVIOUSLY REPORTED AND CALLED VALUE.    Culture (A)  Final    STAPHYLOCOCCUS AUREUS SUSCEPTIBILITIES PERFORMED ON PREVIOUS CULTURE WITHIN THE LAST 5 DAYS. Performed at [facility], [place] [address] [place], [place] [address]    Report Status 06/12/2019 FINAL  Final  Culture, blood (routine x 2)     Status: None (Preliminary result)   Collection Time: 06/11/19  1:03 PM   Specimen: BLOOD  Result Value Ref Range Status   Specimen Description BLOOD LEFT ANTECUBITAL  Final   Special Requests AEROBIC BOTTLE ONLY Blood Culture adequate volume  Final   Culture   Final    NO GROWTH < 24 HOURS Performed at [facility], [place] [address], [place], [place] [address]    Report Status PENDING  Incomplete  Culture, blood (routine x 2)     Status: None (Preliminary result)   Collection Time: 06/11/19  1:03 PM   Specimen: BLOOD  Result Value Ref Range Status   Specimen Description BLOOD LEFT ANTECUBITAL  Final    Special Requests AEROBIC BOTTLE ONLY Blood Culture adequate volume  Final   Culture   Final    NO GROWTH < 24 HOURS Performed at [facility], [place] [address] [place], [place] [address]    Report Status PENDING  Incomplete     Terri Piedra, NP [facility] [phone] Pager  06/13/2019  11:09 AM

## 2019-06-13 NOTE — Progress Notes (Signed)
Physical Therapy Treatment Patient Details Name: David Bird MRN: [id number] DOB: [birth year] Today's Date: 06/13/2019    History of Present Illness Pt is an 83 y/o male who had a L3-L4 TLIF on 05/20/2019. Pt d/c home with son and reports he was progressing well so transitioned home alone where he became weak, lost his appetite, and was laying in bed ~2 days before calling son to come get him.  PMH significant for CVA, HTN, DMII, CAD s/p stent, CA, partial gastrectomy (~1960), R ankle fracture with plate fixation.     PT Comments    Pt progressing towards physical therapy goals. Was able to perform transfers and ambulation with gross supervision for safety with RW for support. Pt's tolerance for functional activity appears to be improving, and pt was able to tolerate over 500' distance in hall today with 2 short standing rest breaks. Pt somewhat resistant to education from therapist at times, however son present and reinforced safety and urged participation. Will continue to follow and progress as able per POC.     Follow Up Recommendations  Home health PT;Supervision for mobility/OOB     Equipment Recommendations  None recommended by PT    Recommendations for Other Services       Precautions / Restrictions Precautions Precautions: Fall;Back Precaution Booklet Issued: No Precaution Comments: reviewed back precautions during functional mobility.  Required Braces or Orthoses: Spinal Brace Spinal Brace: Lumbar corset;Applied in sitting position Restrictions Weight Bearing Restrictions: No    Mobility  Bed Mobility Overal bed mobility: Modified Independent             General bed mobility comments: maintains back precautions with supine>sit with hospital bed features, does not fully maintain back precautions with sit>supine  Transfers Overall transfer level: Needs assistance Equipment used: Rolling walker (2 wheeled) Transfers: Sit to/from Stand Sit to Stand:  Supervision         General transfer comment: Guarded upon first stand but no unsteadiness or LOB noted.   Ambulation/Gait Ambulation/Gait assistance: Supervision Gait Distance (Feet): 500 Feet Assistive device: Rolling walker (2 wheeled) Gait Pattern/deviations: Step-through pattern;Decreased stride length;Trunk flexed Gait velocity: Decreased Gait velocity interpretation: <1.31 ft/sec, indicative of household ambulator General Gait Details: 2 standing rest breaks but overall tolerated activity well. VC's for improved posture, closer walker proximity, and forward gaze.    Stairs             Wheelchair Mobility    Modified Rankin (Stroke Patients Only)       Balance Overall balance assessment: Needs assistance Sitting-balance support: Feet supported;No upper extremity supported Sitting balance-Leahy Scale: Good     Standing balance support: During functional activity;Bilateral upper extremity supported Standing balance-Leahy Scale: Fair Standing balance comment: BUE on RW                            Cognition Arousal/Alertness: Awake/alert Behavior During Therapy: Flat affect Overall Cognitive Status: Within Functional Limits for tasks assessed                                        Exercises      General Comments        Pertinent Vitals/Pain Pain Assessment: Faces Faces Pain Scale: Hurts little more Pain Location: incision Pain Descriptors / Indicators: Operative site guarding;Sore Pain Intervention(s): Limited activity within patient's tolerance;Monitored during session;Repositioned  Home Living                      Prior Function            PT Goals (current goals can now be found in the care plan section) Acute Rehab PT Goals Patient Stated Goal: Get back to "normal" PT Goal Formulation: With patient Time For Goal Achievement: 06/17/19 Potential to Achieve Goals: Good Progress towards PT goals:  Progressing toward goals    Frequency    Min 5X/week      PT Plan Current plan remains appropriate    Co-evaluation              AM-PAC PT "6 Clicks" Mobility   Outcome Measure  Help needed turning from your back to your side while in a flat bed without using bedrails?: None Help needed moving from lying on your back to sitting on the side of a flat bed without using bedrails?: None Help needed moving to and from a bed to a chair (including a wheelchair)?: A Little Help needed standing up from a chair using your arms (e.g., wheelchair or bedside chair)?: A Little Help needed to walk in hospital room?: A Little Help needed climbing 3-5 steps with a railing? : A Little 6 Click Score: 20    End of Session Equipment Utilized During Treatment: Back brace;Gait belt Activity Tolerance: Patient tolerated treatment well Patient left: in bed;with call bell/phone within reach;with bed alarm set;with family/visitor present(MD entering room) Nurse Communication: Patient requests pain meds PT Visit Diagnosis: Unsteadiness on feet (R26.81);Pain;Muscle weakness (generalized) (M62.81) Pain - part of body: (back)     Time: [id number] PT Time Calculation (min) (ACUTE ONLY): 22 min  Charges:  $Gait Training: 8-22 mins                     Rolinda Roan, PT, DPT Acute Rehabilitation Services Pager: [phone] Office: [phone]    [name] 06/13/2019, 1:34 PM

## 2019-06-13 NOTE — Progress Notes (Signed)
[facility] Progress Note    Assessment/ Plan:   1. Hyponatremia:  Initially looked like hypotonic hyponatremia, exacerbated by HCTZ, after volume resuscitation mixed pic developed with SIADH as well.  SIADH predominating at present- fluid restrict and salt tabs continue, continue Lasix 20 IV BID, Na up to 125.  2. MSSA bacteremia: on cefazolin, rifampin added-- MRI without hardware infection, ID on board, rec 6 weeks of IV antibiotics with PICC 3. HTN - avoid acei/ thiazide for now, any other agents ok 4. SP recent lumbar decompression surg 5. HL 6. DM on oral agents 7. Dispo: pending rx plan for MSSA bacteremia and better Na  Subjective:    Lying in bed, NAD,  Na up to 125   Objective:   BP 127/67 (BP Location: Left Arm)   Pulse 65   Temp 98.7 F (37.1 C) (Oral)   Resp 18   Wt 67.7 kg   SpO2 99%   BMI 21.42 kg/m   Intake/Output Summary (Last 24 hours) at 06/13/2019 1132 Last data filed at 06/13/2019 [id number] Gross per 24 hour  Intake 1007.76 ml  Output 2950 ml  Net -1942.24 ml   Weight change: 0.1 kg  Physical Exam: Gen: older man NAD CVS: RRR no m/r/g Resp: clear bilaterally Abd: soft Ext: no LE edema NEURO: AAO x 3  Imaging: Mr Lumbar Spine Wo Contrast  Result Date: 06/11/2019 CLINICAL DATA:  83 year old male status post L3-L4 fusion last month. Back pain with infection suspected. EXAM: MRI LUMBAR SPINE WITHOUT CONTRAST TECHNIQUE: Multiplanar, multisequence MR imaging of the lumbar spine was performed. No intravenous contrast was administered. COMPARISON:  Preoperative lumbar MRI 05/13/2019. FINDINGS: Segmentation: Lumbar segmentation appears to be normal and is the same numbering system used in October. Alignment: Regressed grade 1 spondylolisthesis at L3-L4 following surgery. Stable mild retrolisthesis at L1-L2. Stable vertebral height and alignment otherwise. Vertebrae: Continued mild degenerative appearing endplate marrow edema in the lower  thoracic spine at T11-T12 and T12-L1. Hardware susceptibility artifact at L3-L4. Posterior decompression at that level. No convincing marrow edema or evidence of acute osseous abnormality. Intact visible sacrum and SI joints. Conus medullaris and cauda equina: Conus extends to the T12-L1 level. No lower spinal cord or conus signal abnormality. Paraspinal and other soft tissues: Stable visible abdominal viscera. Confluent STIR hyperintensity in the lumbar rect or spine I muscles and throughout the operative bed at the L3-L4 level (series 6 images 6-10). At the laminectomy site on series 8, image 14 there appears to be generalized soft tissue edema. There is mild associated mass effect on the thecal sac (see below). However, in the absence of IV contrast there is no discrete paraspinal fluid collection. The psoas musculature remains normal. Disc levels: Unchanged degeneration from the lower thoracic spine through L1-L2. L2-L3: Stable circumferential disc bulge. Mildly increased posterior element hypertrophy. Subsequent increased mild to moderate spinal stenosis (series 8, image 9). Stable mild foraminal stenosis greater on the right. L3-L4: Interval decompression and fusion. Edematous posterior soft tissue with mild mass effect on the thecal sac resulting in mild spinal stenosis (series 8, image 15), although thecal sac patency has increased since 05/13/2019. Superimposed granulation tissue including at the right lateral recess and right L3 neural foramen. L4-L5 and L5-S1 levels are stable. IMPRESSION: 1. Extensive soft tissue edema in and around the L3-L4 laminectomy site does raise the possibility of a soft tissue infection. But there is no abscess or fluid collection identified in the absence of IV contrast, and no evidence of  osteomyelitis. 2. Soft tissue edema resulting in mild mass effect on the posterior thecal sac at L3-L4. Mild spinal stenosis there although thecal sac patency has improved since the  preoperative MRI. 3. Increased mild to moderate multifactorial spinal stenosis at L2-L3 appears related to interval posterior element hypertrophy. 4. Other lumbar levels are stable. Electronically Signed   By: Genevie Ann M.D.   On: 06/11/2019 22:59    Labs: BMET Recent Labs  Lab 06/10/19 1152 06/10/19 1641 06/10/19 1821 06/11/19 0026 06/11/19 0651 06/11/19 1303 06/11/19 1713 06/11/19 2324 06/12/19 0529 06/12/19 1125 06/12/19 1827 06/13/19 0506  NA 121* 121* 120* 123* 124* 121* 122* 123* 124* 122* 125* 125*  K 4.0 4.0 4.4 3.9 3.9  --   --   --  4.4  --   --  4.0  CL 84* 86* 84* 87* 88*  --   --   --  88*  --   --  87*  CO2 22 20* 22 24 22   --   --   --  23  --   --  23  GLUCOSE 102* 153* 179* 126* 119*  --   --   --  108*  --   --  113*  BUN 15 16 16 16 14   --   --   --  11  --   --  18  CREATININE 0.73 0.97 0.89 0.91 0.95  --   --   --  0.91  --   --  1.03  CALCIUM 8.9 8.4* 8.5* 8.6* 8.7*  --   --   --  8.7*  --   --  8.8*  PHOS  --  2.9  --   --   --   --   --   --   --   --   --   --    CBC Recent Labs  Lab 06/09/19 1557 06/09/19 2055 06/10/19 0017  WBC 7.3  --  5.4  HGB 11.1*  --  10.2*  HCT 32.1* 29.3* 28.7*  MCV 85.6  --  83.7  PLT 292  --  248    Medications:    . atenolol  50 mg Oral Daily  . bethanechol  25 mg Oral Daily  . docusate sodium  100 mg Oral BID  . enoxaparin (LOVENOX) injection  40 mg Subcutaneous Q24H  . ferrous sulfate  325 mg Oral BID WC  . furosemide  20 mg Intravenous BID  . lisinopril  20 mg Oral Daily  . magnesium hydroxide  30 mL Oral Once  . rifampin  300 mg Oral Q12H  . rosuvastatin  5 mg Oral Daily  . sodium chloride flush  3 mL Intravenous Once  . sodium chloride  1 g Oral Q8H  . tamsulosin  0.4 mg Oral QPC supper      Madelon Lips MD 06/13/2019, 11:32 AM

## 2019-06-13 NOTE — Progress Notes (Signed)
[facility] PROGRESS NOTE  David Bird [id number] DOB: 1936/03/31 DOA: 06/09/2019 PCP: Ocie Doyne., MD   Subjective  He reports back pain is better, no fever or chills overnight. Seen by neurosurgery, no intervention recommended. Complains about constipation  Brief history  He is 83 years old with past with a history of CAD, CVA, hypertension and recent L3-L4 decompression in October presented with decreased appetite and weakness, he was found to have hyponatremia, MSSA bacteremia and metabolic acidosis.  Assessment and plan   Principal Problem:   MSSA bacteremia Active Problems:   Acute hyponatremia   Generalized weakness   Essential hypertension   MSSA bacteremia Culture growing MSSA, unclear source. Had recent lumbar fusion surgery done by Dr. Arnoldo Morale. Some swelling, questionable infection, neurosurgery to evaluate. Discussed with Dr. Baxter Flattery, likely will be a good idea to treat for 6 weeks of antibiotics.  Recent L3-4 laminotomy This is done by Dr. Arnoldo Morale on 05/20/2019, patient not complaining about pain. Because of concurrent MSSA bacteremia, MRI was done. MRI showed soft tissue edema but no drainable abscess or fluid collection. Dr. Arnoldo Morale evaluated the patient recommend continue antibiotics  Hyponatremia, acute hypotonic Presented with sodium of 117 and creatinine of 1.22. The goal of treatment is 6-8 mEq when sodium is less than 120, then 0.5 mEq/h. This is per nephrology, sodium improving as planned  History of CVA Continue statins, not on aspirin for some reason.  BPH/urinary retention Continue on Flomax, patient reported he was treated recently with antibiotics.  Generalized weakness Likely multifactorial secondary to MSSA bacteremia and severe hyponatremia.  Constipation MOM 30 mils x1.   Code Status: Full Code Family Communication: Plan discussed with the patient. Disposition Plan: Remains inpatient Diet:  Diet Order        Diet Heart Room service appropriate? Yes; Fluid consistency: Thin; Fluid restriction: 1200 mL Fluid  Diet effective now               Consultants   [place]  Procedures  . None  Antibiotics   Cefazolin and rifampin   Objective   Vitals:   06/13/19 0543 06/13/19 0900  BP: 133/79 127/67  Pulse: 76 65  Resp: 17 18  Temp: 98.2 F (36.8 C) 98.7 F (37.1 C)  SpO2: 98% 99%    Intake/Output Summary (Last 24 hours) at 06/13/2019 1007 Last data filed at 06/13/2019 0823 Gross per 24 hour  Intake 1007.76 ml  Output 3250 ml  Net -2242.24 ml   Filed Weights   06/12/19 0459 06/12/19 2101  Weight: 67.6 kg 67.7 kg    Physical examination  General: Alert and awake, oriented x3, not in any acute distress. HEENT: anicteric sclera, pupils reactive to light and accommodation, EOMI CVS: S1-S2 clear, no murmur rubs or gallops Chest: clear to auscultation bilaterally, no wheezing, rales or rhonchi Abdomen: soft nontender, nondistended, normal bowel sounds, no organomegaly Extremities: no cyanosis, clubbing or edema noted bilaterally Neuro: Cranial nerves II-XII intact, no focal neurological deficits  Reviewed data  Basic Metabolic Panel: Recent Labs  Lab 06/10/19 1641 06/10/19 1821 06/11/19 0026 06/11/19 0651  06/11/19 2324 06/12/19 0529 06/12/19 1125 06/12/19 1827 06/13/19 0506  NA 121* 120* 123* 124*   < > 123* 124* 122* 125* 125*  K 4.0 4.4 3.9 3.9  --   --  4.4  --   --  4.0  CL 86* 84* 87* 88*  --   --  88*  --   --  87*  CO2 20* 22  24 22  --   --  23  --   --  23  GLUCOSE 153* 179* 126* 119*  --   --  108*  --   --  113*  BUN 16 16 16 14   --   --  11  --   --  18  CREATININE 0.97 0.89 0.91 0.95  --   --  0.91  --   --  1.03  CALCIUM 8.4* 8.5* 8.6* 8.7*  --   --  8.7*  --   --  8.8*  PHOS 2.9  --   --   --   --   --   --   --   --   --    < > = values in this interval not displayed.   Liver Function Tests: Recent Labs  Lab 06/09/19 1557 06/10/19 0506  06/10/19 1641 06/13/19 0506  AST 58* 35  --  26  ALT 45* 39  --  21  ALKPHOS 93 94  --  92  BILITOT 0.6 0.7  --  1.3*  PROT 6.6 6.9  --  6.4*  ALBUMIN 3.0* 2.9* 2.7* 2.6*   Recent Labs  Lab 06/09/19 1557  LIPASE 26   No results for input(s): AMMONIA in the last 168 hours. CBC: Recent Labs  Lab 06/09/19 1557 06/09/19 2055 06/10/19 0017  WBC 7.3  --  5.4  HGB 11.1*  --  10.2*  HCT 32.1* 29.3* 28.7*  MCV 85.6  --  83.7  PLT 292  --  248   Cardiac Enzymes: No results for input(s): CKTOTAL, CKMB, CKMBINDEX, TROPONINI in the last 168 hours. BNP (last 3 results) No results for input(s): BNP in the last 8760 hours.  ProBNP (last 3 results) No results for input(s): PROBNP in the last 8760 hours.  CBG: No results for input(s): GLUCAP in the last 168 hours.  Micro Recent Results (from the past 240 hour(s))  SARS CORONAVIRUS 2 (TAT 6-24 HRS) Nasopharyngeal Nasopharyngeal Swab     Status: None   Collection Time: 06/09/19  5:05 PM   Specimen: Nasopharyngeal Swab  Result Value Ref Range Status   SARS Coronavirus 2 NEGATIVE NEGATIVE Final    Comment: (NOTE) SARS-CoV-2 target nucleic acids are NOT DETECTED. The SARS-CoV-2 RNA is generally detectable in upper and lower respiratory specimens during the acute phase of infection. Negative results do not preclude SARS-CoV-2 infection, do not rule out co-infections with other pathogens, and should not be used as the sole basis for treatment or other patient management decisions. Negative results must be combined with clinical observations, patient history, and epidemiological information. The expected result is Negative. Fact Sheet for Patients: SugarRoll.be Fact Sheet for Healthcare Providers: https://www.woods-mathews.com/ This test is not yet approved or cleared by the [place] FDA and  has been authorized for detection and/or diagnosis of SARS-CoV-2 by FDA under an Emergency Use  Authorization (EUA). This EUA will remain  in effect (meaning this test can be used) for the duration of the COVID-19 declaration under Section 56 4(b)(1) of the Act, 21 U.S.C. section 360bbb-3(b)(1), unless the authorization is terminated or revoked sooner. Performed at [facility], [place] [address], [place], [place] [address]   Culture, Urine     Status: None   Collection Time: 06/09/19  8:40 PM   Specimen: Urine, Random  Result Value Ref Range Status   Specimen Description URINE, RANDOM  Final   Special Requests NONE  Final   Culture  Final    NO GROWTH Performed at [facility], [place] [address], [place], [place] [address]    Report Status 06/10/2019 FINAL  Final  Culture, blood (routine x 2)     Status: Abnormal   Collection Time: 06/09/19  8:55 PM   Specimen: BLOOD LEFT ARM  Result Value Ref Range Status   Specimen Description BLOOD LEFT ARM  Final   Special Requests   Final    BOTTLES DRAWN AEROBIC AND ANAEROBIC Blood Culture adequate volume   Culture  Setup Time   Final    GRAM POSITIVE COCCI IN BOTH AEROBIC AND ANAEROBIC BOTTLES CRITICAL RESULT CALLED TO, READ BACK BY AND VERIFIED WITH: Hughie Closs [facility] 06/10/19 2036 JDW Performed at [facility], [place] [address], [place], [place] [address]    Culture STAPHYLOCOCCUS AUREUS (A)  Final   Report Status 06/12/2019 FINAL  Final   Organism ID, Bacteria STAPHYLOCOCCUS AUREUS  Final      Susceptibility   Staphylococcus aureus - MIC*    CIPROFLOXACIN <=0.5 SENSITIVE Sensitive     ERYTHROMYCIN >=8 RESISTANT Resistant     GENTAMICIN <=0.5 SENSITIVE Sensitive     OXACILLIN 0.5 SENSITIVE Sensitive     TETRACYCLINE <=1 SENSITIVE Sensitive     VANCOMYCIN <=0.5 SENSITIVE Sensitive     TRIMETH/SULFA <=10 SENSITIVE Sensitive     CLINDAMYCIN RESISTANT Resistant     RIFAMPIN <=0.5 SENSITIVE Sensitive     Inducible Clindamycin POSITIVE Resistant     * STAPHYLOCOCCUS AUREUS  Blood Culture ID Panel (Reflexed)      Status: Abnormal   Collection Time: 06/09/19  8:55 PM  Result Value Ref Range Status   Enterococcus species NOT DETECTED NOT DETECTED Final   Listeria monocytogenes NOT DETECTED NOT DETECTED Final   Staphylococcus species DETECTED (A) NOT DETECTED Final    Comment: CRITICAL RESULT CALLED TO, READ BACK BY AND VERIFIED WITH: Hughie Closs [facility] 06/10/19 2036 JDW    Staphylococcus aureus (BCID) DETECTED (A) NOT DETECTED Final    Comment: Methicillin (oxacillin) susceptible Staphylococcus aureus (MSSA). Preferred therapy is anti staphylococcal beta lactam antibiotic (Cefazolin or Nafcillin), unless clinically contraindicated. CRITICAL RESULT CALLED TO, READ BACK BY AND VERIFIED WITH: Hughie Closs [facility] 06/10/19 2036 JDW    Methicillin resistance NOT DETECTED NOT DETECTED Final   Streptococcus species NOT DETECTED NOT DETECTED Final   Streptococcus agalactiae NOT DETECTED NOT DETECTED Final   Streptococcus pneumoniae NOT DETECTED NOT DETECTED Final   Streptococcus pyogenes NOT DETECTED NOT DETECTED Final   Acinetobacter baumannii NOT DETECTED NOT DETECTED Final   Enterobacteriaceae species NOT DETECTED NOT DETECTED Final   Enterobacter cloacae complex NOT DETECTED NOT DETECTED Final   Escherichia coli NOT DETECTED NOT DETECTED Final   Klebsiella oxytoca NOT DETECTED NOT DETECTED Final   Klebsiella pneumoniae NOT DETECTED NOT DETECTED Final   Proteus species NOT DETECTED NOT DETECTED Final   Serratia marcescens NOT DETECTED NOT DETECTED Final   Haemophilus influenzae NOT DETECTED NOT DETECTED Final   Neisseria meningitidis NOT DETECTED NOT DETECTED Final   Pseudomonas aeruginosa NOT DETECTED NOT DETECTED Final   Candida albicans NOT DETECTED NOT DETECTED Final   Candida glabrata NOT DETECTED NOT DETECTED Final   Candida krusei NOT DETECTED NOT DETECTED Final   Candida parapsilosis NOT DETECTED NOT DETECTED Final   Candida tropicalis NOT DETECTED NOT DETECTED Final    Comment: Performed at [facility], [place]. [address], [place], [place] [address]  Culture, blood (routine x  2)     Status: Abnormal   Collection Time: 06/09/19  9:00 PM   Specimen: BLOOD LEFT HAND  Result Value Ref Range Status   Specimen Description BLOOD LEFT HAND  Final   Special Requests   Final    BOTTLES DRAWN AEROBIC ONLY Blood Culture adequate volume   Culture  Setup Time   Final    GRAM POSITIVE COCCI AEROBIC BOTTLE ONLY CRITICAL VALUE NOTED.  VALUE IS CONSISTENT WITH PREVIOUSLY REPORTED AND CALLED VALUE.    Culture (A)  Final    STAPHYLOCOCCUS AUREUS SUSCEPTIBILITIES PERFORMED ON PREVIOUS CULTURE WITHIN THE LAST 5 DAYS. Performed at [facility], [place] [address] [place], [place] [address]    Report Status 06/12/2019 FINAL  Final  Culture, blood (routine x 2)     Status: None (Preliminary result)   Collection Time: 06/11/19  1:03 PM   Specimen: BLOOD  Result Value Ref Range Status   Specimen Description BLOOD LEFT ANTECUBITAL  Final   Special Requests AEROBIC BOTTLE ONLY Blood Culture adequate volume  Final   Culture   Final    NO GROWTH < 24 HOURS Performed at [facility], [place] [address] [place], [place] [address]    Report Status PENDING  Incomplete  Culture, blood (routine x 2)     Status: None (Preliminary result)   Collection Time: 06/11/19  1:03 PM   Specimen: BLOOD  Result Value Ref Range Status   Specimen Description BLOOD LEFT ANTECUBITAL  Final   Special Requests AEROBIC BOTTLE ONLY Blood Culture adequate volume  Final   Culture   Final    NO GROWTH < 24 HOURS Performed at [facility], [place] [address] [place], [place] [address]    Report Status PENDING  Incomplete     Radiological studies, reviewed  Mr Lumbar Spine Wo Contrast  Result Date: 06/11/2019 CLINICAL DATA:  83 year old male status post L3-L4 fusion last month. Back pain with infection suspected. EXAM: MRI LUMBAR SPINE WITHOUT CONTRAST TECHNIQUE: Multiplanar, multisequence MR imaging of the  lumbar spine was performed. No intravenous contrast was administered. COMPARISON:  Preoperative lumbar MRI 05/13/2019. FINDINGS: Segmentation: Lumbar segmentation appears to be normal and is the same numbering system used in October. Alignment: Regressed grade 1 spondylolisthesis at L3-L4 following surgery. Stable mild retrolisthesis at L1-L2. Stable vertebral height and alignment otherwise. Vertebrae: Continued mild degenerative appearing endplate marrow edema in the lower thoracic spine at T11-T12 and T12-L1. Hardware susceptibility artifact at L3-L4. Posterior decompression at that level. No convincing marrow edema or evidence of acute osseous abnormality. Intact visible sacrum and SI joints. Conus medullaris and cauda equina: Conus extends to the T12-L1 level. No lower spinal cord or conus signal abnormality. Paraspinal and other soft tissues: Stable visible abdominal viscera. Confluent STIR hyperintensity in the lumbar rect or spine I muscles and throughout the operative bed at the L3-L4 level (series 6 images 6-10). At the laminectomy site on series 8, image 14 there appears to be generalized soft tissue edema. There is mild associated mass effect on the thecal sac (see below). However, in the absence of IV contrast there is no discrete paraspinal fluid collection. The psoas musculature remains normal. Disc levels: Unchanged degeneration from the lower thoracic spine through L1-L2. L2-L3: Stable circumferential disc bulge. Mildly increased posterior element hypertrophy. Subsequent increased mild to moderate spinal stenosis (series 8, image 9). Stable mild foraminal stenosis greater on the right. L3-L4: Interval decompression and fusion. Edematous posterior soft tissue with mild  mass effect on the thecal sac resulting in mild spinal stenosis (series 8, image 15), although thecal sac patency has increased since 05/13/2019. Superimposed granulation tissue including at the right lateral recess and right L3 neural  foramen. L4-L5 and L5-S1 levels are stable. IMPRESSION: 1. Extensive soft tissue edema in and around the L3-L4 laminectomy site does raise the possibility of a soft tissue infection. But there is no abscess or fluid collection identified in the absence of IV contrast, and no evidence of osteomyelitis. 2. Soft tissue edema resulting in mild mass effect on the posterior thecal sac at L3-L4. Mild spinal stenosis there although thecal sac patency has improved since the preoperative MRI. 3. Increased mild to moderate multifactorial spinal stenosis at L2-L3 appears related to interval posterior element hypertrophy. 4. Other lumbar levels are stable. Electronically Signed   By: Genevie Ann M.D.   On: 06/11/2019 22:59    Scheduled Meds: . atenolol  50 mg Oral Daily  . bethanechol  25 mg Oral Daily  . docusate sodium  100 mg Oral BID  . enoxaparin (LOVENOX) injection  40 mg Subcutaneous Q24H  . ferrous sulfate  325 mg Oral BID WC  . furosemide  20 mg Intravenous BID  . lisinopril  20 mg Oral Daily  . magnesium hydroxide  30 mL Oral Once  . rifampin  300 mg Oral Q12H  . rosuvastatin  5 mg Oral Daily  . sodium chloride flush  3 mL Intravenous Once  . sodium chloride  1 g Oral Q8H  . tamsulosin  0.4 mg Oral QPC supper   Continuous Infusions: .  ceFAZolin (ANCEF) IV 2 g (06/13/19 0546)     Time spent: 35 minutes  [facility] Pager [phone] If 7PM-7AM, please contact night-coverage at [email or web address], password [facility] 06/13/2019, 10:07 AM  LOS: 4 days

## 2019-06-14 ENCOUNTER — Inpatient Hospital Stay: Payer: Self-pay

## 2019-06-14 LAB — SODIUM, URINE, RANDOM: Sodium, Ur: 81 mmol/L

## 2019-06-14 LAB — RENAL FUNCTION PANEL
Albumin: 2.7 g/dL — ABNORMAL LOW (ref 3.5–5.0)
Anion gap: 14 (ref 5–15)
BUN: 20 mg/dL (ref 8–23)
CO2: 22 mmol/L (ref 22–32)
Calcium: 8.8 mg/dL — ABNORMAL LOW (ref 8.9–10.3)
Chloride: 87 mmol/L — ABNORMAL LOW (ref 98–111)
Creatinine, Ser: 1.07 mg/dL (ref 0.61–1.24)
GFR calc Af Amer: >60 mL/min (ref >60)
GFR calc non Af Amer: >60 mL/min (ref >60)
Glucose, Bld: 156 mg/dL — ABNORMAL HIGH (ref 70–99)
Phosphorus: 3.9 mg/dL (ref 2.5–4.6)
Potassium: 3.8 mmol/L (ref 3.5–5.1)
Sodium: 123 mmol/L — ABNORMAL LOW (ref 135–145)

## 2019-06-14 LAB — OSMOLALITY: Osmolality: 260 mOsm/kg — ABNORMAL LOW (ref 275–295)

## 2019-06-14 LAB — C-REACTIVE PROTEIN: CRP: 13.2 mg/dL — ABNORMAL HIGH (ref <1.0)

## 2019-06-14 LAB — SEDIMENTATION RATE: Sed Rate: 90 mm/hr — ABNORMAL HIGH (ref 0–16)

## 2019-06-14 LAB — OSMOLALITY, URINE: Osmolality, Ur: 273 mOsm/kg — ABNORMAL LOW (ref 300–900)

## 2019-06-14 LAB — SODIUM: Sodium: 127 mmol/L — ABNORMAL LOW (ref 135–145)

## 2019-06-14 MED ORDER — CEFAZOLIN IV (FOR PTA / DISCHARGE USE ONLY): 2.0000 g | Freq: Three times a day (TID) | INTRAVENOUS | 0 refills | Status: AC

## 2019-06-14 MED ORDER — BISACODYL 10 MG RE SUPP
10.0000 mg | Freq: Once | RECTAL | Status: AC
  Administered 2019-06-14: 10 mg via RECTAL
  Filled 2019-06-14: qty 1

## 2019-06-14 MED ORDER — BISACODYL 10 MG RE SUPP
10.0000 mg | Freq: Every day | RECTAL | Status: DC | PRN
  Administered 2019-06-17: 10 mg via RECTAL
  Filled 2019-06-14: qty 1

## 2019-06-14 MED ORDER — RIFAMPIN 300 MG PO CAPS: 300.0000 mg | ORAL_CAPSULE | Freq: Two times a day (BID) | ORAL | 0 refills | Status: AC

## 2019-06-14 MED ORDER — FUROSEMIDE 10 MG/ML IJ SOLN
40.0000 mg | Freq: Two times a day (BID) | INTRAMUSCULAR | Status: DC
  Administered 2019-06-14 – 2019-06-16 (×5): 40 mg via INTRAVENOUS
  Filled 2019-06-14 (×5): qty 4

## 2019-06-14 NOTE — Progress Notes (Signed)
[facility] PROGRESS NOTE  David Bird [id number] DOB: [birth year] DOA: 06/09/2019 PCP: Ocie Doyne., MD   Subjective  Denies any fever or chills overnight, denies any significant complaints other than constipation. Sodium is 123, per nephrology.  Brief history  He is 83 years old with past with a history of CAD, CVA, hypertension and recent L3-L4 decompression in October presented with decreased appetite and weakness, he was found to have hyponatremia, MSSA bacteremia and metabolic acidosis.  Assessment and plan   Principal Problem:   MSSA bacteremia Active Problems:   Acute hyponatremia   Generalized weakness   Essential hypertension   MSSA bacteremia Culture growing MSSA, unclear source. Had recent lumbar fusion surgery done by Dr. Arnoldo Morale. Some swelling, questionable infection, neurosurgery to evaluate. Discussed with Dr. Baxter Flattery, likely will be a good idea to treat for 6 weeks of antibiotics.  Recent L3-4 laminotomy This is done by Dr. Arnoldo Morale on 05/20/2019, patient not complaining about pain. Because of concurrent MSSA bacteremia, MRI was done. MRI showed soft tissue edema but no drainable abscess or fluid collection. Dr. Arnoldo Morale evaluated the patient recommend continue antibiotics  Hyponatremia, acute hypotonic Presented with sodium of 117 and creatinine of 1.22. The goal of treatment is 6-8 mEq when sodium is less than 120, then 0.5 mEq/h. Sodium is down today to 123, urine electrolytes ordered.  History of CVA Continue statins, not on aspirin for some reason.  BPH/urinary retention Continue on Flomax, patient reported he was treated recently with antibiotics.  Generalized weakness Likely multifactorial secondary to MSSA bacteremia and severe hyponatremia.  Constipation Received milk of magnesia did not help, suppository x1.   Code Status: Full Code Family Communication: Plan discussed with the patient. Disposition Plan: Remains  inpatient Diet:  Diet Order            Diet Heart Room service appropriate? Yes; Fluid consistency: Thin; Fluid restriction: 1200 mL Fluid  Diet effective now               Consultants   [place]  Procedures  . None  Antibiotics   Cefazolin and rifampin   Objective   Vitals:   06/13/19 2110 06/14/19 0603  BP: (!) 145/75 (!) 114/53  Pulse: 71 66  Resp: 16 19  Temp: 98.6 F (37 C) 98.8 F (37.1 C)  SpO2: 97% 96%    Intake/Output Summary (Last 24 hours) at 06/14/2019 1058 Last data filed at 06/13/2019 2100 Gross per 24 hour  Intake 120 ml  Output 1900 ml  Net -1780 ml   Filed Weights   06/12/19 0459 06/12/19 2101  Weight: 67.6 kg 67.7 kg    Physical examination  General: Alert and awake, oriented x3, not in any acute distress. HEENT: anicteric sclera, pupils reactive to light and accommodation, EOMI CVS: S1-S2 clear, no murmur rubs or gallops Chest: clear to auscultation bilaterally, no wheezing, rales or rhonchi Abdomen: soft nontender, nondistended, normal bowel sounds, no organomegaly Extremities: no cyanosis, clubbing or edema noted bilaterally Neuro: Cranial nerves II-XII intact, no focal neurological deficits  Reviewed data  Basic Metabolic Panel: Recent Labs  Lab 06/10/19 1641  06/11/19 0026 06/11/19 0651  06/12/19 0529 06/12/19 1125 06/12/19 1827 06/13/19 0506 06/14/19 0723  NA 121*   < > 123* 124*   < > 124* 122* 125* 125* 123*  K 4.0   < > 3.9 3.9  --  4.4  --   --  4.0 3.8  CL 86*   < > 87* 88*  --  88*  --   --  87* 87*  CO2 20*   < > 24 22  --  23  --   --  23 22  GLUCOSE 153*   < > 126* 119*  --  108*  --   --  113* 156*  BUN 16   < > 16 14  --  11  --   --  18 20  CREATININE 0.97   < > 0.91 0.95  --  0.91  --   --  1.03 1.07  CALCIUM 8.4*   < > 8.6* 8.7*  --  8.7*  --   --  8.8* 8.8*  PHOS 2.9  --   --   --   --   --   --   --   --  3.9   < > = values in this interval not displayed.   Liver Function Tests: Recent Labs  Lab  06/09/19 1557 06/10/19 0506 06/10/19 1641 06/13/19 0506 06/14/19 0723  AST 58* 35  --  26  --   ALT 45* 39  --  21  --   ALKPHOS 93 94  --  92  --   BILITOT 0.6 0.7  --  1.3*  --   PROT 6.6 6.9  --  6.4*  --   ALBUMIN 3.0* 2.9* 2.7* 2.6* 2.7*   Recent Labs  Lab 06/09/19 1557  LIPASE 26   No results for input(s): AMMONIA in the last 168 hours. CBC: Recent Labs  Lab 06/09/19 1557 06/09/19 2055 06/10/19 0017  WBC 7.3  --  5.4  HGB 11.1*  --  10.2*  HCT 32.1* 29.3* 28.7*  MCV 85.6  --  83.7  PLT 292  --  248   Cardiac Enzymes: No results for input(s): CKTOTAL, CKMB, CKMBINDEX, TROPONINI in the last 168 hours. BNP (last 3 results) No results for input(s): BNP in the last 8760 hours.  ProBNP (last 3 results) No results for input(s): PROBNP in the last 8760 hours.  CBG: No results for input(s): GLUCAP in the last 168 hours.  Micro Recent Results (from the past 240 hour(s))  SARS CORONAVIRUS 2 (TAT 6-24 HRS) Nasopharyngeal Nasopharyngeal Swab     Status: None   Collection Time: 06/09/19  5:05 PM   Specimen: Nasopharyngeal Swab  Result Value Ref Range Status   SARS Coronavirus 2 NEGATIVE NEGATIVE Final    Comment: (NOTE) SARS-CoV-2 target nucleic acids are NOT DETECTED. The SARS-CoV-2 RNA is generally detectable in upper and lower respiratory specimens during the acute phase of infection. Negative results do not preclude SARS-CoV-2 infection, do not rule out co-infections with other pathogens, and should not be used as the sole basis for treatment or other patient management decisions. Negative results must be combined with clinical observations, patient history, and epidemiological information. The expected result is Negative. Fact Sheet for Patients: SugarRoll.be Fact Sheet for Healthcare Providers: https://www.woods-mathews.com/ This test is not yet approved or cleared by the [place] FDA and  has been authorized  for detection and/or diagnosis of SARS-CoV-2 by FDA under an Emergency Use Authorization (EUA). This EUA will remain  in effect (meaning this test can be used) for the duration of the COVID-19 declaration under Section 56 4(b)(1) of the Act, 21 U.S.C. section 360bbb-3(b)(1), unless the authorization is terminated or revoked sooner. Performed at [facility], [place] [address] [place], [place] [address]   Culture, Urine     Status: None   Collection  Time: 06/09/19  8:40 PM   Specimen: Urine, Random  Result Value Ref Range Status   Specimen Description URINE, RANDOM  Final   Special Requests NONE  Final   Culture   Final    NO GROWTH Performed at [facility], [address], [place], [place] [address]    Report Status 06/10/2019 FINAL  Final  Culture, blood (routine x 2)     Status: Abnormal   Collection Time: 06/09/19  8:55 PM   Specimen: BLOOD LEFT ARM  Result Value Ref Range Status   Specimen Description BLOOD LEFT ARM  Final   Special Requests   Final    BOTTLES DRAWN AEROBIC AND ANAEROBIC Blood Culture adequate volume   Culture  Setup Time   Final    GRAM POSITIVE COCCI IN BOTH AEROBIC AND ANAEROBIC BOTTLES CRITICAL RESULT CALLED TO, READ BACK BY AND VERIFIED WITH: Hughie Closs [facility] 06/10/19 2036 JDW Performed at [facility], [place] [address] [place], [place] [address]    Culture STAPHYLOCOCCUS AUREUS (A)  Final   Report Status 06/12/2019 FINAL  Final   Organism ID, Bacteria STAPHYLOCOCCUS AUREUS  Final      Susceptibility   Staphylococcus aureus - MIC*    CIPROFLOXACIN <=0.5 SENSITIVE Sensitive     ERYTHROMYCIN >=8 RESISTANT Resistant     GENTAMICIN <=0.5 SENSITIVE Sensitive     OXACILLIN 0.5 SENSITIVE Sensitive     TETRACYCLINE <=1 SENSITIVE Sensitive     VANCOMYCIN <=0.5 SENSITIVE Sensitive     TRIMETH/SULFA <=10 SENSITIVE Sensitive     CLINDAMYCIN RESISTANT Resistant     RIFAMPIN <=0.5 SENSITIVE Sensitive     Inducible Clindamycin POSITIVE  Resistant     * STAPHYLOCOCCUS AUREUS  Blood Culture ID Panel (Reflexed)     Status: Abnormal   Collection Time: 06/09/19  8:55 PM  Result Value Ref Range Status   Enterococcus species NOT DETECTED NOT DETECTED Final   Listeria monocytogenes NOT DETECTED NOT DETECTED Final   Staphylococcus species DETECTED (A) NOT DETECTED Final    Comment: CRITICAL RESULT CALLED TO, READ BACK BY AND VERIFIED WITH: Hughie Closs [facility] 06/10/19 2036 JDW    Staphylococcus aureus (BCID) DETECTED (A) NOT DETECTED Final    Comment: Methicillin (oxacillin) susceptible Staphylococcus aureus (MSSA). Preferred therapy is anti staphylococcal beta lactam antibiotic (Cefazolin or Nafcillin), unless clinically contraindicated. CRITICAL RESULT CALLED TO, READ BACK BY AND VERIFIED WITH: Hughie Closs [facility] 06/10/19 2036 JDW    Methicillin resistance NOT DETECTED NOT DETECTED Final   Streptococcus species NOT DETECTED NOT DETECTED Final   Streptococcus agalactiae NOT DETECTED NOT DETECTED Final   Streptococcus pneumoniae NOT DETECTED NOT DETECTED Final   Streptococcus pyogenes NOT DETECTED NOT DETECTED Final   Acinetobacter baumannii NOT DETECTED NOT DETECTED Final   Enterobacteriaceae species NOT DETECTED NOT DETECTED Final   Enterobacter cloacae complex NOT DETECTED NOT DETECTED Final   Escherichia coli NOT DETECTED NOT DETECTED Final   Klebsiella oxytoca NOT DETECTED NOT DETECTED Final   Klebsiella pneumoniae NOT DETECTED NOT DETECTED Final   Proteus species NOT DETECTED NOT DETECTED Final   Serratia marcescens NOT DETECTED NOT DETECTED Final   Haemophilus influenzae NOT DETECTED NOT DETECTED Final   Neisseria meningitidis NOT DETECTED NOT DETECTED Final   Pseudomonas aeruginosa NOT DETECTED NOT DETECTED Final   Candida albicans NOT DETECTED NOT DETECTED Final   Candida glabrata NOT DETECTED NOT DETECTED Final   Candida krusei NOT DETECTED NOT DETECTED Final   Candida parapsilosis NOT  DETECTED NOT DETECTED Final    Candida tropicalis NOT DETECTED NOT DETECTED Final    Comment: Performed at [facility], [place] [address] [place], [place] [address]  Culture, blood (routine x 2)     Status: Abnormal   Collection Time: 06/09/19  9:00 PM   Specimen: BLOOD LEFT HAND  Result Value Ref Range Status   Specimen Description BLOOD LEFT HAND  Final   Special Requests   Final    BOTTLES DRAWN AEROBIC ONLY Blood Culture adequate volume   Culture  Setup Time   Final    GRAM POSITIVE COCCI AEROBIC BOTTLE ONLY CRITICAL VALUE NOTED.  VALUE IS CONSISTENT WITH PREVIOUSLY REPORTED AND CALLED VALUE.    Culture (A)  Final    STAPHYLOCOCCUS AUREUS SUSCEPTIBILITIES PERFORMED ON PREVIOUS CULTURE WITHIN THE LAST 5 DAYS. Performed at [facility], [place] [address] [place], [place] [address]    Report Status 06/12/2019 FINAL  Final  Culture, blood (routine x 2)     Status: None (Preliminary result)   Collection Time: 06/11/19  1:03 PM   Specimen: BLOOD  Result Value Ref Range Status   Specimen Description BLOOD LEFT ANTECUBITAL  Final   Special Requests AEROBIC BOTTLE ONLY Blood Culture adequate volume  Final   Culture   Final    NO GROWTH 3 DAYS Performed at [place] [address] [place], [place] [address]    Report Status PENDING  Incomplete  Culture, blood (routine x 2)     Status: None (Preliminary result)   Collection Time: 06/11/19  1:03 PM   Specimen: BLOOD  Result Value Ref Range Status   Specimen Description BLOOD LEFT ANTECUBITAL  Final   Special Requests AEROBIC BOTTLE ONLY Blood Culture adequate volume  Final   Culture   Final    NO GROWTH 3 DAYS Performed at [facility], [place] [address] [place],  [address]    Report Status PENDING  Incomplete     Radiological studies, reviewed  No results found.  Scheduled Meds: . atenolol  50 mg Oral Daily  . bethanechol  25 mg Oral Daily  . docusate sodium  100 mg Oral BID  . enoxaparin (LOVENOX) injection  40 mg  Subcutaneous Q24H  . ferrous sulfate  325 mg Oral BID WC  . lisinopril  20 mg Oral Daily  . rifampin  300 mg Oral Q12H  . rosuvastatin  5 mg Oral Daily  . sodium chloride flush  3 mL Intravenous Once  . sodium chloride  1 g Oral Q8H  . tamsulosin  0.4 mg Oral QPC supper   Continuous Infusions: .  ceFAZolin (ANCEF) IV 2 g (06/14/19 0612)     Time spent: 35 minutes  [facility] Pager [phone] If 7PM-7AM, please contact night-coverage at [email or web address], password [facility] 06/14/2019, 10:58 AM  LOS: 5 days

## 2019-06-14 NOTE — Progress Notes (Signed)
Subjective: The patient is alert and pleasant.  He complains of constipation.  Otherwise he feels well.  Objective: Vital signs in last 24 hours: Temp:  [98.6 F (37 C)-98.8 F (37.1 C)] 98.8 F (37.1 C) (11/20 0603) Pulse Rate:  [65-71] 66 (11/20 0603) Resp:  [16-19] 19 (11/20 0603) BP: (114-145)/(53-75) 114/53 (11/20 0603) SpO2:  [96 %-99 %] 96 % (11/20 0603) Estimated body mass index is 21.42 kg/m as calculated from the following:   Height as of 05/20/19: 5\' 10"  (1.778 m).   Weight as of this encounter: 67.7 kg.   Intake/Output from previous day: 11/19 0701 - 11/20 0700 In: 360 [P.O.:360] Out: 2000 [Urine:2000] Intake/Output this shift: No intake/output data recorded.  Physical exam the patient is alert and oriented.  He is moving all 4 extremities well.  Lab Results: No results for input(s): WBC, HGB, HCT, PLT in the last 72 hours. BMET Recent Labs    06/12/19 0529  06/12/19 1827 06/13/19 0506  NA 124*   < > 125* 125*  K 4.4  --   --  4.0  CL 88*  --   --  87*  CO2 23  --   --  23  GLUCOSE 108*  --   --  113*  BUN 11  --   --  18  CREATININE 0.91  --   --  1.03  CALCIUM 8.7*  --   --  8.8*   < > = values in this interval not displayed.    Studies/Results: No results found.  Assessment/Plan: Hyponatremia: Labs are pending  Status post lumbar fusion: He seems to be healing well.  Staph bacteremia: The plan is for 6 weeks of IV Keflex via PICC line per ID.  Constipation: I will add a Dulcolax suppository.  LOS: 5 days     Ophelia Charter 06/14/2019, 7:47 AM

## 2019-06-14 NOTE — TOC Progression Note (Addendum)
Transition of Care [facility]) - Progression Note    Patient Details  Name: David Bird MRN: [id number] Date of Birth: 03-02-1936  [facility] CM/SW Contact  Bartholomew Crews, RN Phone Number: [phone] 06/14/2019, 3:20 PM  Clinical Narrative:    Spoke with patient at the bedside to discuss choice of home care agency. Patient agreeable to NCM calling son. Spoke with Merry Proud on the phone who will contact his niece about [facility] choice of agency. Will advise [facility] of selected agency for coordination of care.  Spoke with son, wants [facility], but out of network. 2nd choice [facility] - declined d/t at capacity for [employer]. Referral  declined for [facility].   TOC team following for transition needs.    Expected Discharge Plan: [place] Barriers to Discharge: Continued Medical Work up  Expected Discharge Plan and Services Expected Discharge Plan: [place]   Discharge Planning Services: CM Consult   Living arrangements for the past 2 months: Single Family Home Expected Discharge Date: 06/11/19                                     Social Determinants of Health (SDOH) Interventions    Readmission Risk Interventions No flowsheet data found.

## 2019-06-14 NOTE — Progress Notes (Signed)
[facility] Progress Note    Assessment/ Plan:   1. Hyponatremia:  Initially looked like hypotonic hyponatremia, exacerbated by HCTZ, after volume resuscitation mixed pic developed with SIADH as well.  SIADH thought to be predominating--> but not improving with fluid restrict/ salt tabs/ Lasix as expected.  Repeat urine and serum Na/ lytes, will hold further Lasix until these are back.  Serial Na checks 2. MSSA bacteremia: on cefazolin, rifampin added-- MRI without hardware infection, ID on board, rec 6 weeks of IV antibiotics with PICC 3. HTN: on meds 4. SP recent lumbar decompression surg--> NSG following, no indication for surgical intervention 5. HL 6. DM on oral agents 7. Constipation- hasn't had a BM since Sunday, per primary 8. Dispo: pending rx plan for MSSA bacteremia and better Na  Subjective:    Na down to 123 from 125--> urine/serum lytes and osms reordered.     Objective:   BP (!) 114/53 (BP Location: Right Arm)   Pulse 66   Temp 98.8 F (37.1 C) (Oral)   Resp 19   Wt 67.7 kg   SpO2 96%   BMI 21.42 kg/m   Intake/Output Summary (Last 24 hours) at 06/14/2019 1022 Last data filed at 06/13/2019 2100 Gross per 24 hour  Intake 120 ml  Output 1900 ml  Net -1780 ml   Weight change:   Physical Exam: Gen: older man NAD CVS: RRR no m/r/g Resp: clear bilaterally Abd: soft Ext: no LE edema NEURO: AAO x 3  Imaging: No results found.  Labs: BMET Recent Labs  Lab 06/10/19 1641 06/10/19 1821 06/11/19 0026 06/11/19 [id number]  06/11/19 1713 06/11/19 2324 06/12/19 0529 06/12/19 1125 06/12/19 1827 06/13/19 0506 06/14/19 0723  NA 121* 120* 123* 124*   < > 122* 123* 124* 122* 125* 125* 123*  K 4.0 4.4 3.9 3.9  --   --   --  4.4  --   --  4.0 3.8  CL 86* 84* 87* 88*  --   --   --  88*  --   --  87* 87*  CO2 20* 22 24 22   --   --   --  23  --   --  23 22  GLUCOSE 153* 179* 126* 119*  --   --   --  108*  --   --  113* 156*  BUN 16 16 16 14   --   --    --  11  --   --  18 20  CREATININE 0.97 0.89 0.91 0.95  --   --   --  0.91  --   --  1.03 1.07  CALCIUM 8.4* 8.5* 8.6* 8.7*  --   --   --  8.7*  --   --  8.8* 8.8*  PHOS 2.9  --   --   --   --   --   --   --   --   --   --  3.9   < > = values in this interval not displayed.   CBC Recent Labs  Lab 06/09/19 1557 06/09/19 2055 06/10/19 0017  WBC 7.3  --  5.4  HGB 11.1*  --  10.2*  HCT 32.1* 29.3* 28.7*  MCV 85.6  --  83.7  PLT 292  --  248    Medications:    . atenolol  50 mg Oral Daily  . bethanechol  25 mg Oral Daily  . docusate sodium  100 mg Oral BID  .  enoxaparin (LOVENOX) injection  40 mg Subcutaneous Q24H  . ferrous sulfate  325 mg Oral BID WC  . furosemide  40 mg Intravenous BID  . lisinopril  20 mg Oral Daily  . rifampin  300 mg Oral Q12H  . rosuvastatin  5 mg Oral Daily  . sodium chloride flush  3 mL Intravenous Once  . sodium chloride  1 g Oral Q8H  . tamsulosin  0.4 mg Oral QPC supper      Madelon Lips MD 06/14/2019, 10:22 AM

## 2019-06-14 NOTE — TOC Progression Note (Signed)
Transition of Care [facility]) - Progression Note    Patient Details  Name: David Bird MRN: [id number] Date of Birth: July 29, 1935  Transition of Care [facility]) CM/SW Contact  Bartholomew Crews, RN Phone Number: [phone] 06/14/2019, 2:04 PM  Clinical Narrative:    Late entry from yesterday morning. Spoke with patient and son at the bedside to discuss home health needs. Discussed potential need for long term antibiotics pending recommendations. Patient states that his granddaughter is a Marine scientist and can assist with his care. Discussed choice of [place] agencies - will follow up for choice. [facility] aware of potential need for IV antibiotics. TOC following for transition needs.    Expected Discharge Plan: [place] Barriers to Discharge: Continued Medical Work up  Expected Discharge Plan and Services Expected Discharge Plan: [place]   Discharge Planning Services: CM Consult   Living arrangements for the past 2 months: Single Family Home Expected Discharge Date: 06/11/19                                     Social Determinants of Health (SDOH) Interventions    Readmission Risk Interventions No flowsheet data found.

## 2019-06-15 LAB — RENAL FUNCTION PANEL
Albumin: 2.8 g/dL — ABNORMAL LOW (ref 3.5–5.0)
Anion gap: 14 (ref 5–15)
BUN: 23 mg/dL (ref 8–23)
CO2: 24 mmol/L (ref 22–32)
Calcium: 8.7 mg/dL — ABNORMAL LOW (ref 8.9–10.3)
Chloride: 85 mmol/L — ABNORMAL LOW (ref 98–111)
Creatinine, Ser: 1.13 mg/dL (ref 0.61–1.24)
GFR calc Af Amer: >60 mL/min (ref >60)
GFR calc non Af Amer: >60 mL/min (ref >60)
Glucose, Bld: 160 mg/dL — ABNORMAL HIGH (ref 70–99)
Phosphorus: 3.5 mg/dL (ref 2.5–4.6)
Potassium: 3.6 mmol/L (ref 3.5–5.1)
Sodium: 123 mmol/L — ABNORMAL LOW (ref 135–145)

## 2019-06-15 LAB — SODIUM: Sodium: 125 mmol/L — ABNORMAL LOW (ref 135–145)

## 2019-06-15 MED ORDER — DEMECLOCYCLINE HCL 150 MG PO TABS
150.0000 mg | ORAL_TABLET | Freq: Two times a day (BID) | ORAL | Status: DC
  Administered 2019-06-15 – 2019-06-18 (×7): 150 mg via ORAL
  Filled 2019-06-15 (×8): qty 1

## 2019-06-15 MED ORDER — LISINOPRIL 10 MG PO TABS
10.0000 mg | ORAL_TABLET | Freq: Every day | ORAL | Status: DC
  Administered 2019-06-16: 10 mg via ORAL
  Filled 2019-06-15: qty 1

## 2019-06-15 NOTE — Progress Notes (Signed)
Overall stable.  Continue IV antibiotics.

## 2019-06-15 NOTE — Progress Notes (Signed)
[facility] Progress Note    Assessment/ Plan:   1. Hyponatremia:  Initially looked like hypotonic hyponatremia, exacerbated by HCTZ, after volume resuscitation mixed pic developed with SIADH as well.  SIADH thought to be predominating--> but not improving with fluid restrict/ salt tabs/ Lasix as expected, think that excessive intake of free water at play here.  Have discussed with nursing staff-- will reinforce fluid restriction.  Demeclocycline added today.  PM Na check. 2. MSSA bacteremia: on cefazolin, rifampin added-- MRI without hardware infection, ID on board, rec 6 weeks of IV antibiotics with PICC.  PICC to be placed today 3. HTN: on meds 4. SP recent lumbar decompression surg--> NSG following, no indication for surgical intervention 5. HL 6. DM on oral agents 7. Constipation- hasn't had a BM since Sunday, per primary 8. Dispo: pending rx plan for MSSA bacteremia and better Na  Subjective:    Na continues to fluctuate.  4 cups of water at bedside today.    Objective:   BP (!) 101/58 (BP Location: Right Arm)   Pulse 68   Temp 99.1 F (37.3 C) (Oral)   Resp 18   Wt 67.7 kg   SpO2 98%   BMI 21.42 kg/m   Intake/Output Summary (Last 24 hours) at 06/15/2019 1137 Last data filed at 06/15/2019 [id number] Gross per 24 hour  Intake 897.11 ml  Output 1000 ml  Net -102.89 ml   Weight change:   Physical Exam: Gen: older man NAD CVS: RRR no m/r/g Resp: clear bilaterally Abd: soft Ext: no LE edema NEURO: AAO x 3  Imaging: [place] Ekg Site Rite  Result Date: 06/14/2019 If [employer] not attached, placement could not be confirmed due to current cardiac rhythm.   Labs: BMET Recent Labs  Lab 06/10/19 1641 06/10/19 1821 06/11/19 0026 06/11/19 [id number]  06/12/19 0529 06/12/19 1125 06/12/19 1827 06/13/19 0506 06/14/19 0723 06/14/19 1240 06/15/19 0835  NA 121* 120* 123* 124*   < > 124* 122* 125* 125* 123* 127* 123*  K 4.0 4.4 3.9 3.9  --  4.4  --   --   4.0 3.8  --  3.6  CL 86* 84* 87* 88*  --  88*  --   --  87* 87*  --  85*  CO2 20* 22 24 22   --  23  --   --  23 22  --  24  GLUCOSE 153* 179* 126* 119*  --  108*  --   --  113* 156*  --  160*  BUN 16 16 16 14   --  11  --   --  18 20  --  23  CREATININE 0.97 0.89 0.91 0.95  --  0.91  --   --  1.03 1.07  --  1.13  CALCIUM 8.4* 8.5* 8.6* 8.7*  --  8.7*  --   --  8.8* 8.8*  --  8.7*  PHOS 2.9  --   --   --   --   --   --   --   --  3.9  --  3.5   < > = values in this interval not displayed.   CBC Recent Labs  Lab 06/09/19 1557 06/09/19 2055 06/10/19 0017  WBC 7.3  --  5.4  HGB 11.1*  --  10.2*  HCT 32.1* 29.3* 28.7*  MCV 85.6  --  83.7  PLT 292  --  248    Medications:    . atenolol  50 mg Oral Daily  . bethanechol  25 mg Oral Daily  . demeclocycline  150 mg Oral Q12H  . docusate sodium  100 mg Oral BID  . enoxaparin (LOVENOX) injection  40 mg Subcutaneous Q24H  . ferrous sulfate  325 mg Oral BID WC  . furosemide  40 mg Intravenous BID  . [START ON 06/16/2019] lisinopril  10 mg Oral Daily  . rifampin  300 mg Oral Q12H  . rosuvastatin  5 mg Oral Daily  . sodium chloride flush  3 mL Intravenous Once  . sodium chloride  1 g Oral Q8H  . tamsulosin  0.4 mg Oral QPC supper      Madelon Lips MD 06/15/2019, 11:37 AM

## 2019-06-15 NOTE — Progress Notes (Signed)
Spoke with Kami, RN c/o PICC. Patient can sign consent. Pt. Probably won't discharge today: Pt. Is still hyponatremic. Plan is to place the PICC this am.

## 2019-06-15 NOTE — Progress Notes (Signed)
Patient had clear yellow moderate drainage from lower back surgical incision and site appears swollen. Patient denies tenderness at site at this time. [place], NP notified via [place]. Awaiting new orders. Will continue to monitor.   Milagros Loll, RN

## 2019-06-15 NOTE — Progress Notes (Signed)
PROGRESS NOTE    David Bird  [id number] DOB: 1935/08/18 DOA: 06/09/2019 PCP: Ocie Doyne., MD     Brief Narrative:  David Bird is a 83 years old with past with a history of CAD, CVA, hypertension and recent L3-L4 decompression in October presented with decreased appetite and weakness, he was found to have hyponatremia, MSSA bacteremia and metabolic acidosis.  New events last 24 hours / Subjective: No complaints today   Assessment & Plan:   Principal Problem:   MSSA bacteremia Active Problems:   Acute hyponatremia   Generalized weakness   Essential hypertension    MSSA bacteremia -Blood culture 11/15 growing MSSA, unclear source. -Had recent lumbar fusion surgery done by Dr. Arnoldo Morale. Neurosurgery following. Surgical site healing well  -Repeat blood culture 11/17 negative  -Appreciate ID. Signed off 11/19, follow up in clinic in 4 weeks -Ancef and rifampin through 12/29 -PICC placement   S/p L3-4 decompression, instrumentation and fusion -MRI showed soft tissue edema but no drainable abscess or fluid collection -Dr. Arnoldo Morale consulted, recommend continue antibiotics -Follow up with Dr. Arnoldo Morale in office in 1-2 weeks   Hyponatremia, hypotonic as well as component of SIADH  -Nephrology following -Declomycin, Lasix, salt tab -Trend Na. 123 this morning.   History of CVA -Continue Crestor  HTN -Continue Tenormin, lisinopril  BPH/urinary retention -Continue on Flomax, bethanechol  Generalized weakness -Likely multifactorial secondary to MSSA bacteremia and severe hyponatremia -Home health recommended    DVT prophylaxis: Lovenox Code Status: Full Family Communication:  None at bedside Disposition Plan: Pending PICC placement, improvement in Na, home health on discharge    Consultants:   Nephrology  Infectious disease  Neurosurgery   Antimicrobials:  Anti-infectives (From admission, onward)   Start     Dose/Rate Route Frequency  Ordered Stop   06/15/19 1015  demeclocycline (DECLOMYCIN) tablet 150 mg     150 mg Oral Every 12 hours 06/15/19 1003     06/14/19 0000  ceFAZolin (ANCEF) IVPB     2 g Intravenous Every 8 hours 06/14/19 1441 07/24/19 2359   06/14/19 0000  rifampin (RIFADIN) 300 MG capsule     300 mg Oral 2 times daily 06/14/19 1441 07/24/19 2359   06/12/19 1000  rifampin (RIFADIN) capsule 300 mg     300 mg Oral Every 12 hours 06/12/19 0912     06/10/19 2200  ceFAZolin (ANCEF) IVPB 2g/100 mL premix     2 g 200 mL/hr over 30 Minutes Intravenous Every 8 hours 06/10/19 2041          Objective: Vitals:   06/14/19 1700 06/14/19 2029 06/15/19 0530 06/15/19 0938  BP: 133/77 139/76 125/66 (!) 101/58  Pulse: 65 72 69 68  Resp: 18 18 16 18   Temp: 98 F (36.7 C) 99.5 F (37.5 C) 98 F (36.7 C) 99.1 F (37.3 C)  TempSrc: Oral Oral Oral Oral  SpO2: 98% 97% 97% 98%  Weight:        Intake/Output Summary (Last 24 hours) at 06/15/2019 1043 Last data filed at 06/15/2019 0523 Gross per 24 hour  Intake 897.11 ml  Output 1150 ml  Net -252.89 ml   Filed Weights   06/12/19 0459 06/12/19 2101  Weight: 67.6 kg 67.7 kg    Examination:  General exam: Appears calm and comfortable  Respiratory system: Clear to auscultation. Respiratory effort normal. No respiratory distress. No conversational dyspnea.  Cardiovascular system: S1 & S2 heard, RRR. No murmurs. No pedal edema. Gastrointestinal system: Abdomen is nondistended,  soft and nontender. Normal bowel sounds heard. Central nervous system: Alert and oriented. No focal neurological deficits. Speech clear.  Extremities: Symmetric in appearance  Skin: No rashes, lesions or ulcers on exposed skin  Psychiatry: Judgement and insight appear normal. Mood & affect appropriate.   Data Reviewed: I have personally reviewed following labs and imaging studies  CBC: Recent Labs  Lab 06/09/19 1557 06/09/19 2055 06/10/19 0017  WBC 7.3  --  5.4  HGB 11.1*  --  10.2*   HCT 32.1* 29.3* 28.7*  MCV 85.6  --  83.7  PLT 292  --  [id number]   Basic Metabolic Panel: Recent Labs  Lab 06/10/19 1641  06/11/19 0651  06/12/19 0529  06/12/19 1827 06/13/19 0506 06/14/19 0723 06/14/19 1240 06/15/19 0835  NA 121*   < > 124*   < > 124*   < > 125* 125* 123* 127* 123*  K 4.0   < > 3.9  --  4.4  --   --  4.0 3.8  --  3.6  CL 86*   < > 88*  --  88*  --   --  87* 87*  --  85*  CO2 20*   < > 22  --  23  --   --  23 22  --  24  GLUCOSE 153*   < > 119*  --  108*  --   --  113* 156*  --  160*  BUN 16   < > 14  --  11  --   --  18 20  --  23  CREATININE 0.97   < > 0.95  --  0.91  --   --  1.03 1.07  --  1.13  CALCIUM 8.4*   < > 8.7*  --  8.7*  --   --  8.8* 8.8*  --  8.7*  PHOS 2.9  --   --   --   --   --   --   --  3.9  --  3.5   < > = values in this interval not displayed.   GFR: Estimated Creatinine Clearance: 49.1 mL/min (by C-G formula based on SCr of 1.13 mg/dL). Liver Function Tests: Recent Labs  Lab 06/09/19 1557 06/10/19 0506 06/10/19 1641 06/13/19 0506 06/14/19 0723 06/15/19 0835  AST 58* 35  --  26  --   --   ALT 45* 39  --  21  --   --   ALKPHOS 93 94  --  92  --   --   BILITOT 0.6 0.7  --  1.3*  --   --   PROT 6.6 6.9  --  6.4*  --   --   ALBUMIN 3.0* 2.9* 2.7* 2.6* 2.7* 2.8*   Recent Labs  Lab 06/09/19 1557  LIPASE 26   No results for input(s): AMMONIA in the last 168 hours. Coagulation Profile: No results for input(s): INR, PROTIME in the last 168 hours. Cardiac Enzymes: No results for input(s): CKTOTAL, CKMB, CKMBINDEX, TROPONINI in the last 168 hours. BNP (last 3 results) No results for input(s): PROBNP in the last 8760 hours. HbA1C: No results for input(s): HGBA1C in the last 72 hours. CBG: No results for input(s): GLUCAP in the last 168 hours. Lipid Profile: No results for input(s): CHOL, HDL, LDLCALC, TRIG, CHOLHDL, LDLDIRECT in the last 72 hours. Thyroid Function Tests: No results for input(s): TSH, T4TOTAL, FREET4, T3FREE,  THYROIDAB in the last 72 hours. Anemia Panel:  No results for input(s): VITAMINB12, FOLATE, FERRITIN, TIBC, IRON, RETICCTPCT in the last 72 hours. Sepsis Labs: No results for input(s): PROCALCITON, LATICACIDVEN in the last 168 hours.  Recent Results (from the past 240 hour(s))  SARS CORONAVIRUS 2 (TAT 6-24 HRS) Nasopharyngeal Nasopharyngeal Swab     Status: None   Collection Time: 06/09/19  5:05 PM   Specimen: Nasopharyngeal Swab  Result Value Ref Range Status   SARS Coronavirus 2 NEGATIVE NEGATIVE Final    Comment: (NOTE) SARS-CoV-2 target nucleic acids are NOT DETECTED. The SARS-CoV-2 RNA is generally detectable in upper and lower respiratory specimens during the acute phase of infection. Negative results do not preclude SARS-CoV-2 infection, do not rule out co-infections with other pathogens, and should not be used as the sole basis for treatment or other patient management decisions. Negative results must be combined with clinical observations, patient history, and epidemiological information. The expected result is Negative. Fact Sheet for Patients: SugarRoll.be Fact Sheet for Healthcare Providers: https://www.woods-mathews.com/ This test is not yet approved or cleared by the [place] FDA and  has been authorized for detection and/or diagnosis of SARS-CoV-2 by FDA under an Emergency Use Authorization (EUA). This EUA will remain  in effect (meaning this test can be used) for the duration of the COVID-19 declaration under Section 56 4(b)(1) of the Act, 21 U.S.C. section 360bbb-3(b)(1), unless the authorization is terminated or revoked sooner. Performed at [facility], [place] [address] [place], [place] [address]   Culture, Urine     Status: None   Collection Time: 06/09/19  8:40 PM   Specimen: Urine, Random  Result Value Ref Range Status   Specimen Description URINE, RANDOM  Final   Special Requests NONE  Final   Culture    Final    NO GROWTH Performed at [facility], [place] [address] [place], [place] [address]    Report Status 06/10/2019 FINAL  Final  Culture, blood (routine x 2)     Status: Abnormal   Collection Time: 06/09/19  8:55 PM   Specimen: BLOOD LEFT ARM  Result Value Ref Range Status   Specimen Description BLOOD LEFT ARM  Final   Special Requests   Final    BOTTLES DRAWN AEROBIC AND ANAEROBIC Blood Culture adequate volume   Culture  Setup Time   Final    GRAM POSITIVE COCCI IN BOTH AEROBIC AND ANAEROBIC BOTTLES CRITICAL RESULT CALLED TO, READ BACK BY AND VERIFIED WITH: Hughie Closs [facility] 06/10/19 2036 JDW Performed at [facility], [place] [address], [place],  [address]    Culture STAPHYLOCOCCUS AUREUS (A)  Final   Report Status 06/12/2019 FINAL  Final   Organism ID, Bacteria STAPHYLOCOCCUS AUREUS  Final      Susceptibility   Staphylococcus aureus - MIC*    CIPROFLOXACIN <=0.5 SENSITIVE Sensitive     ERYTHROMYCIN >=8 RESISTANT Resistant     GENTAMICIN <=0.5 SENSITIVE Sensitive     OXACILLIN 0.5 SENSITIVE Sensitive     TETRACYCLINE <=1 SENSITIVE Sensitive     VANCOMYCIN <=0.5 SENSITIVE Sensitive     TRIMETH/SULFA <=10 SENSITIVE Sensitive     CLINDAMYCIN RESISTANT Resistant     RIFAMPIN <=0.5 SENSITIVE Sensitive     Inducible Clindamycin POSITIVE Resistant     * STAPHYLOCOCCUS AUREUS  Blood Culture ID Panel (Reflexed)     Status: Abnormal   Collection Time: 06/09/19  8:55 PM  Result Value Ref Range Status   Enterococcus species NOT DETECTED NOT DETECTED Final  Listeria monocytogenes NOT DETECTED NOT DETECTED Final   Staphylococcus species DETECTED (A) NOT DETECTED Final    Comment: CRITICAL RESULT CALLED TO, READ BACK BY AND VERIFIED WITH: Hughie Closs [facility] 06/10/19 2036 JDW    Staphylococcus aureus (BCID) DETECTED (A) NOT DETECTED Final    Comment: Methicillin (oxacillin) susceptible Staphylococcus aureus (MSSA). Preferred therapy is anti staphylococcal beta lactam  antibiotic (Cefazolin or Nafcillin), unless clinically contraindicated. CRITICAL RESULT CALLED TO, READ BACK BY AND VERIFIED WITH: Hughie Closs [facility] 06/10/19 2036 JDW    Methicillin resistance NOT DETECTED NOT DETECTED Final   Streptococcus species NOT DETECTED NOT DETECTED Final   Streptococcus agalactiae NOT DETECTED NOT DETECTED Final   Streptococcus pneumoniae NOT DETECTED NOT DETECTED Final   Streptococcus pyogenes NOT DETECTED NOT DETECTED Final   Acinetobacter baumannii NOT DETECTED NOT DETECTED Final   Enterobacteriaceae species NOT DETECTED NOT DETECTED Final   Enterobacter cloacae complex NOT DETECTED NOT DETECTED Final   Escherichia coli NOT DETECTED NOT DETECTED Final   Klebsiella oxytoca NOT DETECTED NOT DETECTED Final   Klebsiella pneumoniae NOT DETECTED NOT DETECTED Final   Proteus species NOT DETECTED NOT DETECTED Final   Serratia marcescens NOT DETECTED NOT DETECTED Final   Haemophilus influenzae NOT DETECTED NOT DETECTED Final   Neisseria meningitidis NOT DETECTED NOT DETECTED Final   Pseudomonas aeruginosa NOT DETECTED NOT DETECTED Final   Candida albicans NOT DETECTED NOT DETECTED Final   Candida glabrata NOT DETECTED NOT DETECTED Final   Candida krusei NOT DETECTED NOT DETECTED Final   Candida parapsilosis NOT DETECTED NOT DETECTED Final   Candida tropicalis NOT DETECTED NOT DETECTED Final    Comment: Performed at [facility], [place]. [address] [place], [place] [address]  Culture, blood (routine x 2)     Status: Abnormal   Collection Time: 06/09/19  9:00 PM   Specimen: BLOOD LEFT HAND  Result Value Ref Range Status   Specimen Description BLOOD LEFT HAND  Final   Special Requests   Final    BOTTLES DRAWN AEROBIC ONLY Blood Culture adequate volume   Culture  Setup Time   Final    GRAM POSITIVE COCCI AEROBIC BOTTLE ONLY CRITICAL VALUE NOTED.  VALUE IS CONSISTENT WITH PREVIOUSLY REPORTED AND CALLED VALUE.    Culture (A)  Final    STAPHYLOCOCCUS AUREUS  SUSCEPTIBILITIES PERFORMED ON PREVIOUS CULTURE WITHIN THE LAST 5 DAYS. Performed at [facility], [place] [address], [place], [place] [address]    Report Status 06/12/2019 FINAL  Final  Culture, blood (routine x 2)     Status: None (Preliminary result)   Collection Time: 06/11/19  1:03 PM   Specimen: BLOOD  Result Value Ref Range Status   Specimen Description BLOOD LEFT ANTECUBITAL  Final   Special Requests AEROBIC BOTTLE ONLY Blood Culture adequate volume  Final   Culture   Final    NO GROWTH 4 DAYS Performed at [place] [address] [place], [place] [address]    Report Status PENDING  Incomplete  Culture, blood (routine x 2)     Status: None (Preliminary result)   Collection Time: 06/11/19  1:03 PM   Specimen: BLOOD  Result Value Ref Range Status   Specimen Description BLOOD LEFT ANTECUBITAL  Final   Special Requests AEROBIC BOTTLE ONLY Blood Culture adequate volume  Final   Culture   Final    NO GROWTH 4 DAYS Performed at [facility], [place] [address] [place], [place] [address]  Report Status PENDING  Incomplete      Radiology Studies: [place] Ekg Site Rite  Result Date: 06/14/2019 If Site Rite image not attached, placement could not be confirmed due to current cardiac rhythm.     Scheduled Meds: . atenolol  50 mg Oral Daily  . bethanechol  25 mg Oral Daily  . demeclocycline  150 mg Oral Q12H  . docusate sodium  100 mg Oral BID  . enoxaparin (LOVENOX) injection  40 mg Subcutaneous Q24H  . ferrous sulfate  325 mg Oral BID WC  . furosemide  40 mg Intravenous BID  . [START ON 06/16/2019] lisinopril  10 mg Oral Daily  . rifampin  300 mg Oral Q12H  . rosuvastatin  5 mg Oral Daily  . sodium chloride flush  3 mL Intravenous Once  . sodium chloride  1 g Oral Q8H  . tamsulosin  0.4 mg Oral QPC supper   Continuous Infusions: .  ceFAZolin (ANCEF) IV 2 g (06/15/19 0523)     LOS: 6 days      Time spent: 35 minutes   Dessa Phi, DO [facility] 06/15/2019, 10:43 AM   Available via Epic secure chat 7am-7pm After these hours, please refer to coverage provider listed on [email or web address]

## 2019-06-15 NOTE — Plan of Care (Signed)
  Problem: Education: Goal: Knowledge of General Education information will improve Description Including pain rating scale, medication(s)/side effects and non-pharmacologic comfort measures Outcome: Progressing   

## 2019-06-16 LAB — BASIC METABOLIC PANEL
Anion gap: 15 (ref 5–15)
BUN: 21 mg/dL (ref 8–23)
CO2: 25 mmol/L (ref 22–32)
Calcium: 9 mg/dL (ref 8.9–10.3)
Chloride: 86 mmol/L — ABNORMAL LOW (ref 98–111)
Creatinine, Ser: 1.03 mg/dL (ref 0.61–1.24)
GFR calc Af Amer: >60 mL/min (ref >60)
GFR calc non Af Amer: >60 mL/min (ref >60)
Glucose, Bld: 130 mg/dL — ABNORMAL HIGH (ref 70–99)
Potassium: 3.7 mmol/L (ref 3.5–5.1)
Sodium: 126 mmol/L — ABNORMAL LOW (ref 135–145)

## 2019-06-16 LAB — CBC
HCT: 32.5 % — ABNORMAL LOW (ref 39.0–52.0)
Hemoglobin: 11.2 g/dL — ABNORMAL LOW (ref 13.0–17.0)
MCH: 28.9 pg (ref 26.0–34.0)
MCHC: 34.5 g/dL (ref 30.0–36.0)
MCV: 83.8 fL (ref 80.0–100.0)
Platelets: 384 10*3/uL (ref 150–400)
RBC: 3.88 MIL/uL — ABNORMAL LOW (ref 4.22–5.81)
RDW: 13.1 % (ref 11.5–15.5)
WBC: 6.9 10*3/uL (ref 4.0–10.5)
nRBC: 0 % (ref 0.0–0.2)

## 2019-06-16 LAB — CULTURE, BLOOD (ROUTINE X 2)
Culture: NO GROWTH
Culture: NO GROWTH
Special Requests: ADEQUATE
Special Requests: ADEQUATE

## 2019-06-16 NOTE — Progress Notes (Signed)
PROGRESS NOTE    David Bird  [id number] DOB: 1936/03/12 DOA: 06/09/2019 PCP: Ocie Doyne., MD     Brief Narrative:  David Bird is a 83 years old with past with a history of CAD, CVA, hypertension and recent L3-L4 decompression in October presented with decreased appetite and weakness, he was found to have hyponatremia, MSSA bacteremia and metabolic acidosis.  New events last 24 hours / Subjective: No physical concerns today   Assessment & Plan:   Principal Problem:   MSSA bacteremia Active Problems:   Acute hyponatremia   Generalized weakness   Essential hypertension    MSSA bacteremia -Blood culture 11/15 growing MSSA, unclear source. -Had recent lumbar fusion surgery done by Dr. Arnoldo Morale. Neurosurgery following. Surgical site healing well  -Repeat blood culture 11/17 negative  -Appreciate ID. Signed off 11/19, follow up in clinic in 4 weeks -Ancef and rifampin through 12/29 -PICC placement   S/p L3-4 decompression, instrumentation and fusion -MRI showed soft tissue edema but no drainable abscess or fluid collection -Dr. Arnoldo Morale consulted, recommend continue antibiotics -Follow up with Dr. Arnoldo Morale in office in 1-2 weeks   Hyponatremia, hypotonic as well as component of SIADH  -Nephrology following -Declomycin, Lasix, salt tab -Trend Na. 126 this morning.   History of CVA -Continue Crestor  HTN -Continue Tenormin, lisinopril  BPH/urinary retention -Continue on Flomax, bethanechol  Generalized weakness -Likely multifactorial secondary to MSSA bacteremia and severe hyponatremia -Home health recommended    DVT prophylaxis: Lovenox Code Status: Full Family Communication:  None at bedside Disposition Plan: Pending PICC placement, improvement in Na, home health on discharge    Consultants:   Nephrology  Infectious disease  Neurosurgery   Antimicrobials:  Anti-infectives (From admission, onward)   Start     Dose/Rate Route  Frequency Ordered Stop   06/15/19 1015  demeclocycline (DECLOMYCIN) tablet 150 mg     150 mg Oral Every 12 hours 06/15/19 1003     06/14/19 0000  ceFAZolin (ANCEF) IVPB     2 g Intravenous Every 8 hours 06/14/19 1441 07/24/19 2359   06/14/19 0000  rifampin (RIFADIN) 300 MG capsule     300 mg Oral 2 times daily 06/14/19 1441 07/24/19 2359   06/12/19 1000  rifampin (RIFADIN) capsule 300 mg     300 mg Oral Every 12 hours 06/12/19 0912     06/10/19 2200  ceFAZolin (ANCEF) IVPB 2g/100 mL premix     2 g 200 mL/hr over 30 Minutes Intravenous Every 8 hours 06/10/19 2041         Objective: Vitals:   06/15/19 1735 06/15/19 2220 06/16/19 0449 06/16/19 0908  BP: 102/62 121/74 126/72 118/68  Pulse: 72 63 69 70  Resp: 18 18 16 18   Temp: 98.6 F (37 C) 98.2 F (36.8 C) (!) 97.3 F (36.3 C) 97.8 F (36.6 C)  TempSrc: Oral Oral Oral Oral  SpO2: 98% 96% 99% 98%  Weight:        Intake/Output Summary (Last 24 hours) at 06/16/2019 1021 Last data filed at 06/16/2019 0900 Gross per 24 hour  Intake 1100 ml  Output 1900 ml  Net -800 ml   Filed Weights   06/12/19 0459 06/12/19 2101  Weight: 67.6 kg 67.7 kg    Examination: General exam: Appears calm and comfortable  Respiratory system: Clear to auscultation. Respiratory effort normal. Cardiovascular system: S1 & S2 heard, RRR. No pedal edema. Gastrointestinal system: Abdomen is nondistended, soft and nontender. Normal bowel sounds heard. Central nervous system:  Alert and oriented. Non focal exam. Speech clear  Extremities: Symmetric in appearance bilaterally  Skin: No rashes, lesions or ulcers on exposed skin  Psychiatry: Judgement and insight appear stable. Mood & affect appropriate.    Data Reviewed: I have personally reviewed following labs and imaging studies  CBC: Recent Labs  Lab 06/09/19 1557 06/09/19 2055 06/10/19 0017 06/16/19 0515  WBC 7.3  --  5.4 6.9  HGB 11.1*  --  10.2* 11.2*  HCT 32.1* 29.3* 28.7* 32.5*  MCV  85.6  --  83.7 83.8  PLT 292  --  248 0000000   Basic Metabolic Panel: Recent Labs  Lab 06/10/19 1641  06/12/19 0529  06/13/19 0506 06/14/19 0723 06/14/19 1240 06/15/19 0835 06/15/19 1457 06/16/19 0515  NA 121*   < > 124*   < > 125* 123* 127* 123* 125* 126*  K 4.0   < > 4.4  --  4.0 3.8  --  3.6  --  3.7  CL 86*   < > 88*  --  87* 87*  --  85*  --  86*  CO2 20*   < > 23  --  23 22  --  24  --  25  GLUCOSE 153*   < > 108*  --  113* 156*  --  160*  --  130*  BUN 16   < > 11  --  18 20  --  23  --  21  CREATININE 0.97   < > 0.91  --  1.03 1.07  --  1.13  --  1.03  CALCIUM 8.4*   < > 8.7*  --  8.8* 8.8*  --  8.7*  --  9.0  PHOS 2.9  --   --   --   --  3.9  --  3.5  --   --    < > = values in this interval not displayed.   GFR: Estimated Creatinine Clearance: 53.9 mL/min (by C-G formula based on SCr of 1.03 mg/dL). Liver Function Tests: Recent Labs  Lab 06/09/19 1557 06/10/19 0506 06/10/19 1641 06/13/19 0506 06/14/19 0723 06/15/19 0835  AST 58* 35  --  26  --   --   ALT 45* 39  --  21  --   --   ALKPHOS 93 94  --  92  --   --   BILITOT 0.6 0.7  --  1.3*  --   --   PROT 6.6 6.9  --  6.4*  --   --   ALBUMIN 3.0* 2.9* 2.7* 2.6* 2.7* 2.8*   Recent Labs  Lab 06/09/19 1557  LIPASE 26   No results for input(s): AMMONIA in the last 168 hours. Coagulation Profile: No results for input(s): INR, PROTIME in the last 168 hours. Cardiac Enzymes: No results for input(s): CKTOTAL, CKMB, CKMBINDEX, TROPONINI in the last 168 hours. BNP (last 3 results) No results for input(s): PROBNP in the last 8760 hours. HbA1C: No results for input(s): HGBA1C in the last 72 hours. CBG: No results for input(s): GLUCAP in the last 168 hours. Lipid Profile: No results for input(s): CHOL, HDL, LDLCALC, TRIG, CHOLHDL, LDLDIRECT in the last 72 hours. Thyroid Function Tests: No results for input(s): TSH, T4TOTAL, FREET4, T3FREE, THYROIDAB in the last 72 hours. Anemia Panel: No results for input(s):  VITAMINB12, FOLATE, FERRITIN, TIBC, IRON, RETICCTPCT in the last 72 hours. Sepsis Labs: No results for input(s): PROCALCITON, LATICACIDVEN in the last 168 hours.  Recent Results (  from the past 240 hour(s))  SARS CORONAVIRUS 2 (TAT 6-24 HRS) Nasopharyngeal Nasopharyngeal Swab     Status: None   Collection Time: 06/09/19  5:05 PM   Specimen: Nasopharyngeal Swab  Result Value Ref Range Status   SARS Coronavirus 2 NEGATIVE NEGATIVE Final    Comment: (NOTE) SARS-CoV-2 target nucleic acids are NOT DETECTED. The SARS-CoV-2 RNA is generally detectable in upper and lower respiratory specimens during the acute phase of infection. Negative results do not preclude SARS-CoV-2 infection, do not rule out co-infections with other pathogens, and should not be used as the sole basis for treatment or other patient management decisions. Negative results must be combined with clinical observations, patient history, and epidemiological information. The expected result is Negative. Fact Sheet for Patients: SugarRoll.be Fact Sheet for Healthcare Providers: https://www.woods-mathews.com/ This test is not yet approved or cleared by the [place] FDA and  has been authorized for detection and/or diagnosis of SARS-CoV-2 by FDA under an Emergency Use Authorization (EUA). This EUA will remain  in effect (meaning this test can be used) for the duration of the COVID-19 declaration under Section 56 4(b)(1) of the Act, 21 U.S.C. section 360bbb-3(b)(1), unless the authorization is terminated or revoked sooner. Performed at [facility], [place] [address], [place], [place] [address]   Culture, Urine     Status: None   Collection Time: 06/09/19  8:40 PM   Specimen: Urine, Random  Result Value Ref Range Status   Specimen Description URINE, RANDOM  Final   Special Requests NONE  Final   Culture   Final    NO GROWTH Performed at [facility], [place] [address], [place], [place] [address]    Report Status 06/10/2019 FINAL  Final  Culture, blood (routine x 2)     Status: Abnormal   Collection Time: 06/09/19  8:55 PM   Specimen: BLOOD LEFT ARM  Result Value Ref Range Status   Specimen Description BLOOD LEFT ARM  Final   Special Requests   Final    BOTTLES DRAWN AEROBIC AND ANAEROBIC Blood Culture adequate volume   Culture  Setup Time   Final    GRAM POSITIVE COCCI IN BOTH AEROBIC AND ANAEROBIC BOTTLES CRITICAL RESULT CALLED TO, READ BACK BY AND VERIFIED WITH: Hughie Closs [facility] 06/10/19 2036 JDW Performed at [facility], [place] [address], [place], [place] [address]    Culture STAPHYLOCOCCUS AUREUS (A)  Final   Report Status 06/12/2019 FINAL  Final   Organism ID, Bacteria STAPHYLOCOCCUS AUREUS  Final      Susceptibility   Staphylococcus aureus - MIC*    CIPROFLOXACIN <=0.5 SENSITIVE Sensitive     ERYTHROMYCIN >=8 RESISTANT Resistant     GENTAMICIN <=0.5 SENSITIVE Sensitive     OXACILLIN 0.5 SENSITIVE Sensitive     TETRACYCLINE <=1 SENSITIVE Sensitive     VANCOMYCIN <=0.5 SENSITIVE Sensitive     TRIMETH/SULFA <=10 SENSITIVE Sensitive     CLINDAMYCIN RESISTANT Resistant     RIFAMPIN <=0.5 SENSITIVE Sensitive     Inducible Clindamycin POSITIVE Resistant     * STAPHYLOCOCCUS AUREUS  Blood Culture ID Panel (Reflexed)     Status: Abnormal   Collection Time: 06/09/19  8:55 PM  Result Value Ref Range Status   Enterococcus species NOT DETECTED NOT DETECTED Final   Listeria monocytogenes NOT DETECTED NOT DETECTED Final   Staphylococcus species DETECTED (A) NOT DETECTED Final    Comment: CRITICAL RESULT CALLED TO, READ BACK BY AND VERIFIED WITH:  Jerilynn Mages [facility] 06/10/19 2036 JDW    Staphylococcus aureus (BCID) DETECTED (A) NOT DETECTED Final    Comment: Methicillin (oxacillin) susceptible Staphylococcus aureus (MSSA). Preferred therapy is anti staphylococcal beta lactam antibiotic (Cefazolin or Nafcillin), unless clinically contraindicated.  CRITICAL RESULT CALLED TO, READ BACK BY AND VERIFIED WITH: Hughie Closs [facility] 06/10/19 2036 JDW    Methicillin resistance NOT DETECTED NOT DETECTED Final   Streptococcus species NOT DETECTED NOT DETECTED Final   Streptococcus agalactiae NOT DETECTED NOT DETECTED Final   Streptococcus pneumoniae NOT DETECTED NOT DETECTED Final   Streptococcus pyogenes NOT DETECTED NOT DETECTED Final   Acinetobacter baumannii NOT DETECTED NOT DETECTED Final   Enterobacteriaceae species NOT DETECTED NOT DETECTED Final   Enterobacter cloacae complex NOT DETECTED NOT DETECTED Final   Escherichia coli NOT DETECTED NOT DETECTED Final   Klebsiella oxytoca NOT DETECTED NOT DETECTED Final   Klebsiella pneumoniae NOT DETECTED NOT DETECTED Final   Proteus species NOT DETECTED NOT DETECTED Final   Serratia marcescens NOT DETECTED NOT DETECTED Final   Haemophilus influenzae NOT DETECTED NOT DETECTED Final   Neisseria meningitidis NOT DETECTED NOT DETECTED Final   Pseudomonas aeruginosa NOT DETECTED NOT DETECTED Final   Candida albicans NOT DETECTED NOT DETECTED Final   Candida glabrata NOT DETECTED NOT DETECTED Final   Candida krusei NOT DETECTED NOT DETECTED Final   Candida parapsilosis NOT DETECTED NOT DETECTED Final   Candida tropicalis NOT DETECTED NOT DETECTED Final    Comment: Performed at [facility], [place]. [address], [place], [place] [address]  Culture, blood (routine x 2)     Status: Abnormal   Collection Time: 06/09/19  9:00 PM   Specimen: BLOOD LEFT HAND  Result Value Ref Range Status   Specimen Description BLOOD LEFT HAND  Final   Special Requests   Final    BOTTLES DRAWN AEROBIC ONLY Blood Culture adequate volume   Culture  Setup Time   Final    GRAM POSITIVE COCCI AEROBIC BOTTLE ONLY CRITICAL VALUE NOTED.  VALUE IS CONSISTENT WITH PREVIOUSLY REPORTED AND CALLED VALUE.    Culture (A)  Final    STAPHYLOCOCCUS AUREUS SUSCEPTIBILITIES PERFORMED ON PREVIOUS CULTURE WITHIN THE LAST 5 DAYS.  Performed at [facility], [place] [address], [place], [place] [address]    Report Status 06/12/2019 FINAL  Final  Culture, blood (routine x 2)     Status: None   Collection Time: 06/11/19  1:03 PM   Specimen: BLOOD  Result Value Ref Range Status   Specimen Description BLOOD LEFT ANTECUBITAL  Final   Special Requests AEROBIC BOTTLE ONLY Blood Culture adequate volume  Final   Culture   Final    NO GROWTH 5 DAYS Performed at [place] [address] [place], [place] [address]    Report Status 06/16/2019 FINAL  Final  Culture, blood (routine x 2)     Status: None   Collection Time: 06/11/19  1:03 PM   Specimen: BLOOD  Result Value Ref Range Status   Specimen Description BLOOD LEFT ANTECUBITAL  Final   Special Requests AEROBIC BOTTLE ONLY Blood Culture adequate volume  Final   Culture   Final    NO GROWTH 5 DAYS Performed at [place] [address], [place], [place] [address]    Report Status 06/16/2019 FINAL  Final      Radiology Studies: [place] Ekg Site Rite  Result Date: 06/14/2019 If Site Rite image not attached, placement could not be  confirmed due to current cardiac rhythm.     Scheduled Meds: . atenolol  50 mg Oral Daily  . bethanechol  25 mg Oral Daily  . demeclocycline  150 mg Oral Q12H  . docusate sodium  100 mg Oral BID  . enoxaparin (LOVENOX) injection  40 mg Subcutaneous Q24H  . ferrous sulfate  325 mg Oral BID WC  . furosemide  40 mg Intravenous BID  . lisinopril  10 mg Oral Daily  . rifampin  300 mg Oral Q12H  . rosuvastatin  5 mg Oral Daily  . sodium chloride flush  3 mL Intravenous Once  . sodium chloride  1 g Oral Q8H  . tamsulosin  0.4 mg Oral QPC supper   Continuous Infusions: .  ceFAZolin (ANCEF) IV 2 g (06/16/19 0500)     LOS: 7 days      Time spent: 25 minutes   Dessa Phi, DO [facility] 06/16/2019, 10:21 AM   Available via Epic secure chat 7am-7pm After these hours, please refer to coverage  provider listed on [email or web address]

## 2019-06-16 NOTE — Progress Notes (Signed)
Spoke with RN c/o PICC placement. Patient is still hyponatremic and no plans for discharge today. PICC possibly place tomorrow.

## 2019-06-16 NOTE — Progress Notes (Signed)
Patient ambulated 200 ft with this RN using rolling walker and wearing his back brace. Patient tolerated well with a short rest before returning to his room. Dorthey Sawyer, RN

## 2019-06-16 NOTE — Plan of Care (Signed)
  Problem: Education: Goal: Knowledge of General Education information will improve Description: Including pain rating scale, medication(s)/side effects and non-pharmacologic comfort measures Outcome: Completed/Met   Problem: Health Behavior/Discharge Planning: Goal: Ability to manage health-related needs will improve Outcome: Completed/Met   Problem: Clinical Measurements: Goal: Ability to maintain clinical measurements within normal limits will improve Outcome: Completed/Met Goal: Diagnostic test results will improve Outcome: Completed/Met Goal: Respiratory complications will improve Outcome: Completed/Met Goal: Cardiovascular complication will be avoided Outcome: Completed/Met   Problem: Nutrition: Goal: Adequate nutrition will be maintained Outcome: Completed/Met   Problem: Elimination: Goal: Will not experience complications related to urinary retention Outcome: Completed/Met   Problem: Pain Managment: Goal: General experience of comfort will improve Outcome: Completed/Met   Problem: Safety: Goal: Ability to remain free from injury will improve Outcome: Completed/Met   Problem: Skin Integrity: Goal: Risk for impaired skin integrity will decrease Outcome: Completed/Met

## 2019-06-16 NOTE — Progress Notes (Addendum)
[facility] Progress Note    Assessment/ Plan:   1. Hyponatremia:  Initially looked like hypotonic hyponatremia, exacerbated by HCTZ, after volume resuscitation mixed pic developed with SIADH as well.  SIADH thought to be predominating--> but not improving with fluid restrict/ salt tabs/ Lasix as expected, think that excessive intake of free water at play here.  Have discussed with nursing staff-- will reinforce fluid restriction.  Demeclocycline 150 mg BID added and now Na up to 126.  Would be OK with discharge when Na close to 130. 2. MSSA bacteremia: on cefazolin, rifampin added-- MRI without hardware infection, ID on board, rec 6 weeks of IV antibiotics with PICC.  PICC placed. 3. HTN: on meds, have stopped HCTZ and have stopped lisinopril 4. SP recent lumbar decompression surg--> NSG following, no indication for surgical intervention 5. HL 6. DM on oral agents 7. Constipation- resolved, had a BM 8. Dispo: pending rx plan for MSSA bacteremia and better Na  Subjective:    Seen in room.  Still with multiple drink cups at bedside.  Na up to 126.      Objective:   BP 118/68 (BP Location: Left Arm)   Pulse 70   Temp 97.8 F (36.6 C) (Oral)   Resp 18   Wt 67.7 kg   SpO2 98%   BMI 21.42 kg/m   Intake/Output Summary (Last 24 hours) at 06/16/2019 1109 Last data filed at 06/16/2019 1045 Gross per 24 hour  Intake 1100 ml  Output 2075 ml  Net -975 ml   Weight change:   Physical Exam: Gen: older man NAD CVS: RRR no m/r/g Resp: clear bilaterally Abd: soft Ext: no LE edema NEURO: AAO x 3  Imaging: [place] Ekg Site Rite  Result Date: 06/14/2019 If [employer] not attached, placement could not be confirmed due to current cardiac rhythm.   Labs: BMET Recent Labs  Lab 06/10/19 1641  06/11/19 0026 06/11/19 [id number]  06/12/19 0529  06/12/19 1827 06/13/19 0506 06/14/19 0723 06/14/19 1240 06/15/19 0835 06/15/19 1457 06/16/19 0515  NA 121*   < > 123* 124*    < > 124*   < > 125* 125* 123* 127* 123* 125* 126*  K 4.0   < > 3.9 3.9  --  4.4  --   --  4.0 3.8  --  3.6  --  3.7  CL 86*   < > 87* 88*  --  88*  --   --  87* 87*  --  85*  --  86*  CO2 20*   < > 24 22  --  23  --   --  23 22  --  24  --  25  GLUCOSE 153*   < > 126* 119*  --  108*  --   --  113* 156*  --  160*  --  130*  BUN 16   < > 16 14  --  11  --   --  18 20  --  23  --  21  CREATININE 0.97   < > 0.91 0.95  --  0.91  --   --  1.03 1.07  --  1.13  --  1.03  CALCIUM 8.4*   < > 8.6* 8.7*  --  8.7*  --   --  8.8* 8.8*  --  8.7*  --  9.0  PHOS 2.9  --   --   --   --   --   --   --   --  3.9  --  3.5  --   --    < > = values in this interval not displayed.   CBC Recent Labs  Lab 06/09/19 1557 06/09/19 2055 06/10/19 0017 06/16/19 0515  WBC 7.3  --  5.4 6.9  HGB 11.1*  --  10.2* 11.2*  HCT 32.1* 29.3* 28.7* 32.5*  MCV 85.6  --  83.7 83.8  PLT 292  --  248 384    Medications:    . atenolol  50 mg Oral Daily  . bethanechol  25 mg Oral Daily  . demeclocycline  150 mg Oral Q12H  . docusate sodium  100 mg Oral BID  . enoxaparin (LOVENOX) injection  40 mg Subcutaneous Q24H  . ferrous sulfate  325 mg Oral BID WC  . furosemide  40 mg Intravenous BID  . lisinopril  10 mg Oral Daily  . rifampin  300 mg Oral Q12H  . rosuvastatin  5 mg Oral Daily  . sodium chloride flush  3 mL Intravenous Once  . sodium chloride  1 g Oral Q8H  . tamsulosin  0.4 mg Oral QPC supper      Madelon Lips MD 06/16/2019, 11:09 AM

## 2019-06-16 NOTE — Progress Notes (Signed)
Patient ID: David Bird, male   DOB: 25-Jan-1936, 83 y.o.   MRN: [id number] BP 118/68 (BP Location: Left Arm)   Pulse 70   Temp 97.8 F (36.6 C) (Oral)   Resp 18   Wt 67.7 kg   SpO2 98%   BMI 21.42 kg/m  Alert and oriented x 4, speech is clear and fluent Moving all extremities Wound dressing is stained Continue current treatment picc for tomorrow

## 2019-06-17 LAB — BASIC METABOLIC PANEL
Anion gap: 14 (ref 5–15)
BUN: 22 mg/dL (ref 8–23)
CO2: 25 mmol/L (ref 22–32)
Calcium: 8.9 mg/dL (ref 8.9–10.3)
Chloride: 88 mmol/L — ABNORMAL LOW (ref 98–111)
Creatinine, Ser: 1.17 mg/dL (ref 0.61–1.24)
GFR calc Af Amer: >60 mL/min (ref >60)
GFR calc non Af Amer: 58 mL/min — ABNORMAL LOW (ref >60)
Glucose, Bld: 205 mg/dL — ABNORMAL HIGH (ref 70–99)
Potassium: 3.6 mmol/L (ref 3.5–5.1)
Sodium: 127 mmol/L — ABNORMAL LOW (ref 135–145)

## 2019-06-17 MED ORDER — SODIUM CHLORIDE 0.9 % IV SOLN
INTRAVENOUS | Status: DC | PRN
  Administered 2019-06-17 – 2019-06-19 (×4): 250 mL via INTRAVENOUS

## 2019-06-17 NOTE — TOC Progression Note (Signed)
Transition of Care [facility]) - Progression Note    Patient Details  Name: David Bird MRN: [id number] Date of Birth: Jul 30, 1935  Transition of Care [facility]) CM/SW Contact  Bartholomew Crews, RN Phone Number: [phone] 06/17/2019, 4:00 PM  Clinical Narrative:    Continue to search for home health services without success - [facility], [facility], and [facility]. Spoke with liaison for [facility] who will reach out to [facility] to provide skilled nursing care. Patient will need [place] orders for RN with Face to Face. TOC team following for transition needs.    Expected Discharge Plan: [place] Barriers to Discharge: Continued Medical Work up  Expected Discharge Plan and Services Expected Discharge Plan: [place]   Discharge Planning Services: CM Consult   Living arrangements for the past 2 months: Single Family Home Expected Discharge Date: 06/11/19                                     Social Determinants of Health (SDOH) Interventions    Readmission Risk Interventions No flowsheet data found.

## 2019-06-17 NOTE — Progress Notes (Signed)
David Bird  PROGRESS NOTE    David Bird  [id number] DOB: 08/12/1935 DOA: 06/09/2019 PCP: Ocie Doyne., MD   Brief Narrative:   David Bird is a 83 years old with past with a history of CAD, CVA, hypertension and recent L3-L4 decompression in October presented with decreased appetite and weakness, he was found to have hyponatremia, MSSA bacteremia and metabolic acidosis.  11/23: Denies complaints this AM. He is eager to get home before the holidays.   Assessment & Plan:   Principal Problem:   MSSA bacteremia Active Problems:   Acute hyponatremia   Generalized weakness   Essential hypertension  MSSA bacteremia     - Bld Cx 11/15 growing MSSA, unclear source.     - Had recent lumbar fusion surgery done by Dr. Arnoldo Morale. Neurosurgery following. Surgical site healing well      - Bld Cx from 11/17 negative      - Appreciate ID. S/o'd: ancef/rifampin through 12/29, follow up in clinic in 4 weeks     - PICC placement for abx   S/p L3-4 decompression, instrumentation and fusion     - MRI showed soft tissue edema but no drainable abscess or fluid collection     - Dr. Arnoldo Morale consulted, recommend continue antibiotics     - Follow up with Dr. Arnoldo Morale in office in 1-2 weeks   Hyponatremia, hypotonic as well as component of SIADH      - Nephrology following; appreciate assistance     - continue demeclocycline, Lasix, salt tab     - Na+ up to 127 this AM; goal is ~130  History of CVA     - continue crestor  HTN     - continue atenolol, lisinopril  BPH/urinary retention     - continue flomax, bethanechol  Generalized weakness     - Likely multifactorial secondary to MSSA bacteremia and severe hyponatremia     - HHPT recommended   DVT prophylaxis: lovenox Code Status: FULL   Disposition Plan: To home with [facility] in the next few days  Consultants:   Neprhology  ID  Neurosurgery  Antimicrobials:  . Ancef, rifampin through 12/29   Subjective: "I hope I can  go home."  Objective: Vitals:   06/16/19 1717 06/16/19 2026 06/17/19 0521 06/17/19 0920  BP: 116/80 115/81 138/88 120/67  Pulse: 66 65 66 67  Resp: 18 20 18 18   Temp: 99 F (37.2 C) 98 F (36.7 C) 98.1 F (36.7 C) 97.7 F (36.5 C)  TempSrc: Oral Oral Oral Oral  SpO2: 97% 98% 97% 97%  Weight:        Intake/Output Summary (Last 24 hours) at 06/17/2019 1512 Last data filed at 06/17/2019 1300 Gross per 24 hour  Intake 680 ml  Output 975 ml  Net -295 ml   Filed Weights   06/12/19 0459 06/12/19 2101  Weight: 67.6 kg 67.7 kg    Examination:  General: 83 y.o. male resting in bed in NAD Cardiovascular: RRR, +S1, S2, no m/g/r, equal pulses throughout Respiratory: CTABL, no w/r/r, normal WOB GI: BS+, NDNT, no masses noted, no organomegaly noted MSK: No e/c/c Neuro: alert to name, follows commands Psyc: Appropriate interaction and affect, calm/cooperative   Data Reviewed: I have personally reviewed following labs and imaging studies.  CBC: Recent Labs  Lab 06/16/19 0515  WBC 6.9  HGB 11.2*  HCT 32.5*  MCV 83.8  PLT 0000000   Basic Metabolic Panel: Recent Labs  Lab 06/10/19 1641  06/13/19  [id number] 06/14/19 0723 06/14/19 1240 06/15/19 0835 06/15/19 1457 06/16/19 0515 06/17/19 0912  NA 121*   < > 125* 123* 127* 123* 125* 126* 127*  K 4.0   < > 4.0 3.8  --  3.6  --  3.7 3.6  CL 86*   < > 87* 87*  --  85*  --  86* 88*  CO2 20*   < > 23 22  --  24  --  25 25  GLUCOSE 153*   < > 113* 156*  --  160*  --  130* 205*  BUN 16   < > 18 20  --  23  --  21 22  CREATININE 0.97   < > 1.03 1.07  --  1.13  --  1.03 1.17  CALCIUM 8.4*   < > 8.8* 8.8*  --  8.7*  --  9.0 8.9  PHOS 2.9  --   --  3.9  --  3.5  --   --   --    < > = values in this interval not displayed.   GFR: Estimated Creatinine Clearance: 47.4 mL/min (by C-G formula based on SCr of 1.17 mg/dL). Liver Function Tests: Recent Labs  Lab 06/10/19 1641 06/13/19 0506 06/14/19 0723 06/15/19 0835  AST  --  26  --    --   ALT  --  21  --   --   ALKPHOS  --  92  --   --   BILITOT  --  1.3*  --   --   PROT  --  6.4*  --   --   ALBUMIN 2.7* 2.6* 2.7* 2.8*   No results for input(s): LIPASE, AMYLASE in the last 168 hours. No results for input(s): AMMONIA in the last 168 hours. Coagulation Profile: No results for input(s): INR, PROTIME in the last 168 hours. Cardiac Enzymes: No results for input(s): CKTOTAL, CKMB, CKMBINDEX, TROPONINI in the last 168 hours. BNP (last 3 results) No results for input(s): PROBNP in the last 8760 hours. HbA1C: No results for input(s): HGBA1C in the last 72 hours. CBG: No results for input(s): GLUCAP in the last 168 hours. Lipid Profile: No results for input(s): CHOL, HDL, LDLCALC, TRIG, CHOLHDL, LDLDIRECT in the last 72 hours. Thyroid Function Tests: No results for input(s): TSH, T4TOTAL, FREET4, T3FREE, THYROIDAB in the last 72 hours. Anemia Panel: No results for input(s): VITAMINB12, FOLATE, FERRITIN, TIBC, IRON, RETICCTPCT in the last 72 hours. Sepsis Labs: No results for input(s): PROCALCITON, LATICACIDVEN in the last 168 hours.  Recent Results (from the past 240 hour(s))  SARS CORONAVIRUS 2 (TAT 6-24 HRS) Nasopharyngeal Nasopharyngeal Swab     Status: None   Collection Time: 06/09/19  5:05 PM   Specimen: Nasopharyngeal Swab  Result Value Ref Range Status   SARS Coronavirus 2 NEGATIVE NEGATIVE Final    Comment: (NOTE) SARS-CoV-2 target nucleic acids are NOT DETECTED. The SARS-CoV-2 RNA is generally detectable in upper and lower respiratory specimens during the acute phase of infection. Negative results do not preclude SARS-CoV-2 infection, do not rule out co-infections with other pathogens, and should not be used as the sole basis for treatment or other patient management decisions. Negative results must be combined with clinical observations, patient history, and epidemiological information. The expected result is Negative. Fact Sheet for Patients:  SugarRoll.be Fact Sheet for Healthcare Providers: https://www.woods-mathews.com/ This test is not yet approved or cleared by the [place] FDA and  has been authorized for detection and/or  diagnosis of SARS-CoV-2 by FDA under an Emergency Use Authorization (EUA). This EUA will remain  in effect (meaning this test can be used) for the duration of the COVID-19 declaration under Section 56 4(b)(1) of the Act, 21 U.S.C. section 360bbb-3(b)(1), unless the authorization is terminated or revoked sooner. Performed at [facility], [place] [address], [place], [place] [address]   Culture, Urine     Status: None   Collection Time: 06/09/19  8:40 PM   Specimen: Urine, Random  Result Value Ref Range Status   Specimen Description URINE, RANDOM  Final   Special Requests NONE  Final   Culture   Final    NO GROWTH Performed at [facility], [place] [address] [place], [place] [address]    Report Status 06/10/2019 FINAL  Final  Culture, blood (routine x 2)     Status: Abnormal   Collection Time: 06/09/19  8:55 PM   Specimen: BLOOD LEFT ARM  Result Value Ref Range Status   Specimen Description BLOOD LEFT ARM  Final   Special Requests   Final    BOTTLES DRAWN AEROBIC AND ANAEROBIC Blood Culture adequate volume   Culture  Setup Time   Final    GRAM POSITIVE COCCI IN BOTH AEROBIC AND ANAEROBIC BOTTLES CRITICAL RESULT CALLED TO, READ BACK BY AND VERIFIED WITH: Hughie Closs [facility] 06/10/19 2036 JDW Performed at [facility], [place] [address] [place], [place] [address]    Culture STAPHYLOCOCCUS AUREUS (A)  Final   Report Status 06/12/2019 FINAL  Final   Organism ID, Bacteria STAPHYLOCOCCUS AUREUS  Final      Susceptibility   Staphylococcus aureus - MIC*    CIPROFLOXACIN <=0.5 SENSITIVE Sensitive     ERYTHROMYCIN >=8 RESISTANT Resistant     GENTAMICIN <=0.5 SENSITIVE Sensitive     OXACILLIN 0.5 SENSITIVE Sensitive     TETRACYCLINE <=1 SENSITIVE  Sensitive     VANCOMYCIN <=0.5 SENSITIVE Sensitive     TRIMETH/SULFA <=10 SENSITIVE Sensitive     CLINDAMYCIN RESISTANT Resistant     RIFAMPIN <=0.5 SENSITIVE Sensitive     Inducible Clindamycin POSITIVE Resistant     * STAPHYLOCOCCUS AUREUS  Blood Culture ID Panel (Reflexed)     Status: Abnormal   Collection Time: 06/09/19  8:55 PM  Result Value Ref Range Status   Enterococcus species NOT DETECTED NOT DETECTED Final   Listeria monocytogenes NOT DETECTED NOT DETECTED Final   Staphylococcus species DETECTED (A) NOT DETECTED Final    Comment: CRITICAL RESULT CALLED TO, READ BACK BY AND VERIFIED WITH: Hughie Closs [facility] 06/10/19 2036 JDW    Staphylococcus aureus (BCID) DETECTED (A) NOT DETECTED Final    Comment: Methicillin (oxacillin) susceptible Staphylococcus aureus (MSSA). Preferred therapy is anti staphylococcal beta lactam antibiotic (Cefazolin or Nafcillin), unless clinically contraindicated. CRITICAL RESULT CALLED TO, READ BACK BY AND VERIFIED WITH: Hughie Closs [facility] 06/10/19 2036 JDW    Methicillin resistance NOT DETECTED NOT DETECTED Final   Streptococcus species NOT DETECTED NOT DETECTED Final   Streptococcus agalactiae NOT DETECTED NOT DETECTED Final   Streptococcus pneumoniae NOT DETECTED NOT DETECTED Final   Streptococcus pyogenes NOT DETECTED NOT DETECTED Final   Acinetobacter baumannii NOT DETECTED NOT DETECTED Final   Enterobacteriaceae species NOT DETECTED NOT DETECTED Final   Enterobacter cloacae complex NOT DETECTED NOT DETECTED Final   Escherichia coli NOT DETECTED NOT DETECTED Final   Klebsiella oxytoca NOT DETECTED NOT DETECTED Final   Klebsiella pneumoniae NOT DETECTED NOT DETECTED Final  Proteus species NOT DETECTED NOT DETECTED Final   Serratia marcescens NOT DETECTED NOT DETECTED Final   Haemophilus influenzae NOT DETECTED NOT DETECTED Final   Neisseria meningitidis NOT DETECTED NOT DETECTED Final   Pseudomonas aeruginosa NOT DETECTED NOT DETECTED Final    Candida albicans NOT DETECTED NOT DETECTED Final   Candida glabrata NOT DETECTED NOT DETECTED Final   Candida krusei NOT DETECTED NOT DETECTED Final   Candida parapsilosis NOT DETECTED NOT DETECTED Final   Candida tropicalis NOT DETECTED NOT DETECTED Final    Comment: Performed at [facility], [place] [address] [place], [place] [address]  Culture, blood (routine x 2)     Status: Abnormal   Collection Time: 06/09/19  9:00 PM   Specimen: BLOOD LEFT HAND  Result Value Ref Range Status   Specimen Description BLOOD LEFT HAND  Final   Special Requests   Final    BOTTLES DRAWN AEROBIC ONLY Blood Culture adequate volume   Culture  Setup Time   Final    GRAM POSITIVE COCCI AEROBIC BOTTLE ONLY CRITICAL VALUE NOTED.  VALUE IS CONSISTENT WITH PREVIOUSLY REPORTED AND CALLED VALUE.    Culture (A)  Final    STAPHYLOCOCCUS AUREUS SUSCEPTIBILITIES PERFORMED ON PREVIOUS CULTURE WITHIN THE LAST 5 DAYS. Performed at [facility], [place] [address] [place], [place] [address]    Report Status 06/12/2019 FINAL  Final  Culture, blood (routine x 2)     Status: None   Collection Time: 06/11/19  1:03 PM   Specimen: BLOOD  Result Value Ref Range Status   Specimen Description BLOOD LEFT ANTECUBITAL  Final   Special Requests AEROBIC BOTTLE ONLY Blood Culture adequate volume  Final   Culture   Final    NO GROWTH 5 DAYS Performed at [place] [address], [place], [place] [address]    Report Status 06/16/2019 FINAL  Final  Culture, blood (routine x 2)     Status: None   Collection Time: 06/11/19  1:03 PM   Specimen: BLOOD  Result Value Ref Range Status   Specimen Description BLOOD LEFT ANTECUBITAL  Final   Special Requests AEROBIC BOTTLE ONLY Blood Culture adequate volume  Final   Culture   Final    NO GROWTH 5 DAYS Performed at [place] [address] [place],  [address]    Report Status 06/16/2019 FINAL  Final      Radiology Studies: No results found.    Scheduled Meds: . atenolol  50 mg Oral Daily  . bethanechol  25 mg Oral Daily  . demeclocycline  150 mg Oral Q12H  . docusate sodium  100 mg Oral BID  . enoxaparin (LOVENOX) injection  40 mg Subcutaneous Q24H  . ferrous sulfate  325 mg Oral BID WC  . rifampin  300 mg Oral Q12H  . rosuvastatin  5 mg Oral Daily  . sodium chloride flush  3 mL Intravenous Once  . sodium chloride  1 g Oral Q8H  . tamsulosin  0.4 mg Oral QPC supper   Continuous Infusions: .  ceFAZolin (ANCEF) IV 2 g (06/17/19 0522)     LOS: 8 days    Time spent: 25 minutes spent in the coordination of care today.    Jonnie Finner, DO [facility] Pager [phone]  If 7PM-7AM, please contact night-coverage [email or web address] Password [email or web address] 06/17/2019, 3:12 PM

## 2019-06-17 NOTE — Evaluation (Signed)
Physical Therapy Treatment Patient Details Name: David Bird MRN: [id number] DOB: 1935/09/23 Today's Date: 06/17/2019    History of Present Illness Pt is an 83 y/o male who had a L3-L4 TLIF on 05/20/2019. Pt d/c home with son and reports he was progressing well so transitioned home alone where he became weak, lost his appetite, and was laying in bed ~2 days before calling son to come get him.  PMH significant for CVA, HTN, DMII, CAD s/p stent, CA, partial gastrectomy (~1960), R ankle fracture with plate fixation.     PT Comments    Patient received in bed, RN present. Patient agrees to PT session. Reports minimal back pain, demonstrates independence with donning lumbar corset. Requires cues to maintain back precautions during mobility. Patient ambulated 400 feet with RW and 3-4 brief standing rest breaks due to reported fatigue and LE weakness. Patient will continue to benefit from skilled PT while here to improve activity tolerance and strength.       Follow Up Recommendations  Home health PT;Supervision - Intermittent     Equipment Recommendations  None recommended by PT    Recommendations for Other Services       Precautions / Restrictions Precautions Precautions: Fall;Back Precaution Booklet Issued: No Required Braces or Orthoses: Spinal Brace Spinal Brace: Lumbar corset;Applied in sitting position Restrictions Weight Bearing Restrictions: No    Mobility  Bed Mobility Overal bed mobility: Modified Independent             General bed mobility comments: maintains back precautions with supine>sit with hospital bed features, does not fully maintain back precautions with sit>supine  Transfers Overall transfer level: Needs assistance Equipment used: Rolling walker (2 wheeled) Transfers: Sit to/from Stand Sit to Stand: Supervision            Ambulation/Gait Ambulation/Gait assistance: Supervision Gait Distance (Feet): 400 Feet Assistive device: Rolling  walker (2 wheeled) Gait Pattern/deviations: Step-through pattern;Trunk flexed     General Gait Details: 2 standing rest breaks but overall tolerated activity well. VC's for improved posture, closer walker proximity, and forward gaze.    Stairs             Wheelchair Mobility    Modified Rankin (Stroke Patients Only)       Balance Overall balance assessment: Modified Independent Sitting-balance support: Feet supported Sitting balance-Leahy Scale: Good     Standing balance support: Bilateral upper extremity supported;During functional activity Standing balance-Leahy Scale: Good                              Cognition Arousal/Alertness: Awake/alert Behavior During Therapy: WFL for tasks assessed/performed Overall Cognitive Status: Within Functional Limits for tasks assessed                                        Exercises      General Comments General comments (skin integrity, edema, etc.): patient requires cues for back precautions, attempting to lean forward to don shoes. Declines LE exercises at end of gait session.      Pertinent Vitals/Pain Pain Assessment: 0-10 Pain Score: 2  Pain Intervention(s): Monitored during session    Home Living                      Prior Function            PT  Goals (current goals can now be found in the care plan section) Acute Rehab PT Goals Patient Stated Goal: Get back to "normal" PT Goal Formulation: With patient Time For Goal Achievement: 06/21/19 Potential to Achieve Goals: Good Progress towards PT goals: Progressing toward goals    Frequency    Min 3X/week      PT Plan Current plan remains appropriate    Co-evaluation              AM-PAC PT "6 Clicks" Mobility   Outcome Measure  Help needed turning from your back to your side while in a flat bed without using bedrails?: None Help needed moving from lying on your back to sitting on the side of a flat bed  without using bedrails?: None Help needed moving to and from a bed to a chair (including a wheelchair)?: A Little Help needed standing up from a chair using your arms (e.g., wheelchair or bedside chair)?: None Help needed to walk in hospital room?: A Little Help needed climbing 3-5 steps with a railing? : A Little 6 Click Score: 21    End of Session Equipment Utilized During Treatment: Gait belt;Back brace Activity Tolerance: Patient tolerated treatment well Patient left: in bed;with call bell/phone within reach Nurse Communication: Mobility status PT Visit Diagnosis: Muscle weakness (generalized) (M62.81)     Time: [id number] PT Time Calculation (min) (ACUTE ONLY): 19 min  Charges:  $Gait Training: 8-22 mins                     [name], PT, GCS 06/17/19,11:33 AM

## 2019-06-17 NOTE — Progress Notes (Signed)
[facility] Progress Note    Assessment/ Plan:   1. Hyponatremia:  Initially looked like hypotonic hyponatremia, exacerbated by HCTZ, after volume resuscitation mixed pic developed with SIADH as well.  SIADH thought to be predominating--> but not improving with fluid restrict/ salt tabs/ Lasix as expected, think that excessive intake of free water at play here.  Have discussed with nursing staff-- will reinforce fluid restriction.  Demeclocycline 150 mg BID added and now Na up to 126.  Would be OK with discharge when Na close to 130. - Await today's labs  - Discussed fluid and free water restriction - Discontinued lasix IV BID - euvolemic  - once discharged will need follow-up with [facility]/Dr. Hollie Salk in one week with labs  2. MSSA bacteremia: on cefazolin, rifampin added-- MRI without hardware infection, ID on board, rec 6 weeks of IV antibiotics with PICC.  PICC placed. 3. HTN: controlled; have stopped HCTZ and have stopped lisinopril 4. SP recent lumbar decompression surg--> NSG following, no indication for surgical intervention 5. DM on oral agents 6. Constipation- resolved, had a BM 7. Dispo: pending rx plan for MSSA bacteremia and better Na  Subjective:    Feels ok today - still hoping to go soon. Hasn't had labs yet and RN is calling phlebotomy.  We discussed fluid and free water restriction   Review of systems:  Denies shortness of breath or chest pain  Denies nausea or vomiting  PO ok      Objective:   BP 138/88 (BP Location: Left Arm)   Pulse 66   Temp 98.1 F (36.7 C) (Oral)   Resp 18   Wt 67.7 kg   SpO2 97%   BMI 21.42 kg/m   Intake/Output Summary (Last 24 hours) at 06/17/2019 [id number] Last data filed at 06/17/2019 0600 Gross per 24 hour  Intake 800 ml  Output 1825 ml  Net -1025 ml   Weight change:   Physical Exam: Gen: adult male in bed in NAD CVS: RRR no m/r/g Resp: clear and unlabored Abd: soft and nontender, nondistended Ext: no LE  edema NEURO: AAO x 3 Psych - normal mood and affect  Imaging: No results found.  Labs: BMET Recent Labs  Lab 06/10/19 1641  06/11/19 0026 06/11/19 [id number]  06/12/19 0529  06/12/19 1827 06/13/19 0506 06/14/19 0723 06/14/19 1240 06/15/19 0835 06/15/19 1457 06/16/19 0515  NA 121*   < > 123* 124*   < > 124*   < > 125* 125* 123* 127* 123* 125* 126*  K 4.0   < > 3.9 3.9  --  4.4  --   --  4.0 3.8  --  3.6  --  3.7  CL 86*   < > 87* 88*  --  88*  --   --  87* 87*  --  85*  --  86*  CO2 20*   < > 24 22  --  23  --   --  23 22  --  24  --  25  GLUCOSE 153*   < > 126* 119*  --  108*  --   --  113* 156*  --  160*  --  130*  BUN 16   < > 16 14  --  11  --   --  18 20  --  23  --  21  CREATININE 0.97   < > 0.91 0.95  --  0.91  --   --  1.03 1.07  --  1.13  --  1.03  CALCIUM 8.4*   < > 8.6* 8.7*  --  8.7*  --   --  8.8* 8.8*  --  8.7*  --  9.0  PHOS 2.9  --   --   --   --   --   --   --   --  3.9  --  3.5  --   --    < > = values in this interval not displayed.   CBC Recent Labs  Lab 06/16/19 0515  WBC 6.9  HGB 11.2*  HCT 32.5*  MCV 83.8  PLT 384    Medications:    . atenolol  50 mg Oral Daily  . bethanechol  25 mg Oral Daily  . demeclocycline  150 mg Oral Q12H  . docusate sodium  100 mg Oral BID  . enoxaparin (LOVENOX) injection  40 mg Subcutaneous Q24H  . ferrous sulfate  325 mg Oral BID WC  . furosemide  40 mg Intravenous BID  . rifampin  300 mg Oral Q12H  . rosuvastatin  5 mg Oral Daily  . sodium chloride flush  3 mL Intravenous Once  . sodium chloride  1 g Oral Q8H  . tamsulosin  0.4 mg Oral QPC supper    Claudia Desanctis 06/17/2019, 8:29 AM

## 2019-06-17 NOTE — Progress Notes (Signed)
Spoke to primary RN re.PICC placement, pt will be discharge tom. 11/24.

## 2019-06-17 NOTE — Progress Notes (Signed)
Subjective: The patient is alert and pleasant.  He wants to go home.  Objective: Vital signs in last 24 hours: Temp:  [97.7 F (36.5 C)-99 F (37.2 C)] 97.7 F (36.5 C) (11/23 0920) Pulse Rate:  [65-67] 67 (11/23 0920) Resp:  [18-20] 18 (11/23 0920) BP: (115-138)/(67-88) 120/67 (11/23 0920) SpO2:  [97 %-98 %] 97 % (11/23 0920) Estimated body mass index is 21.42 kg/m as calculated from the following:   Height as of 05/20/19: 5\' 10"  (1.778 m).   Weight as of this encounter: 67.7 kg.   Intake/Output from previous day: 11/22 0701 - 11/23 0700 In: 800 [P.O.:600; IV Piggyback:200] Out: 1825 [Urine:1825] Intake/Output this shift: Total I/O In: 120 [P.O.:120] Out: 250 [Urine:250]  Physical exam the patient is alert and oriented x3.  He is moving his lower extremities well.  There is no weakness.  The wound is covered with an occlusive dressing with some drainage.  I remove the dressing and was able to express a small amount of purulent material.  I have placed a sterile gauze dressing.  Lab Results: Recent Labs    06/16/19 0515  WBC 6.9  HGB 11.2*  HCT 32.5*  PLT 384   BMET Recent Labs    06/16/19 0515 06/17/19 0912  NA 126* 127*  K 3.7 3.6  CL 86* 88*  CO2 25 25  GLUCOSE 130* 205*  BUN 21 22  CREATININE 1.03 1.17  CALCIUM 9.0 8.9    Studies/Results: No results found.  Assessment/Plan: Wound infection: The patient had some purulent drainage when I change his dressing.  We will continue a 4 x 4 gauze dressing and change daily and as necessary.  I do not think he needs surgery presently but I will keep an eye on his wound.  He is to get 6 weeks of IV cefazolin via a PICC line.  Hyponatremia: His sodium is 127 today.  The hospitalists are managing this.  LOS: 8 days     Ophelia Charter 06/17/2019, 11:59 AM

## 2019-06-18 LAB — CBC WITH DIFFERENTIAL/PLATELET
Abs Immature Granulocytes: 0.24 10*3/uL — ABNORMAL HIGH (ref 0.00–0.07)
Basophils Absolute: 0 10*3/uL (ref 0.0–0.1)
Basophils Relative: 1 %
Eosinophils Absolute: 0.1 10*3/uL (ref 0.0–0.5)
Eosinophils Relative: 1 %
HCT: 34.4 % — ABNORMAL LOW (ref 39.0–52.0)
Hemoglobin: 11.5 g/dL — ABNORMAL LOW (ref 13.0–17.0)
Immature Granulocytes: 4 %
Lymphocytes Relative: 19 %
Lymphs Abs: 1.3 10*3/uL (ref 0.7–4.0)
MCH: 28.6 pg (ref 26.0–34.0)
MCHC: 33.4 g/dL (ref 30.0–36.0)
MCV: 85.6 fL (ref 80.0–100.0)
Monocytes Absolute: 0.8 10*3/uL (ref 0.1–1.0)
Monocytes Relative: 12 %
Neutro Abs: 4.3 10*3/uL (ref 1.7–7.7)
Neutrophils Relative %: 63 %
Platelets: 374 10*3/uL (ref 150–400)
RBC: 4.02 MIL/uL — ABNORMAL LOW (ref 4.22–5.81)
RDW: 13.2 % (ref 11.5–15.5)
WBC: 6.8 10*3/uL (ref 4.0–10.5)
nRBC: 0 % (ref 0.0–0.2)

## 2019-06-18 LAB — RENAL FUNCTION PANEL
Albumin: 2.7 g/dL — ABNORMAL LOW (ref 3.5–5.0)
Anion gap: 12 (ref 5–15)
BUN: 18 mg/dL (ref 8–23)
CO2: 26 mmol/L (ref 22–32)
Calcium: 8.9 mg/dL (ref 8.9–10.3)
Chloride: 93 mmol/L — ABNORMAL LOW (ref 98–111)
Creatinine, Ser: 1.12 mg/dL (ref 0.61–1.24)
GFR calc Af Amer: >60 mL/min (ref >60)
GFR calc non Af Amer: >60 mL/min (ref >60)
Glucose, Bld: 132 mg/dL — ABNORMAL HIGH (ref 70–99)
Phosphorus: 3.4 mg/dL (ref 2.5–4.6)
Potassium: 4.8 mmol/L (ref 3.5–5.1)
Sodium: 131 mmol/L — ABNORMAL LOW (ref 135–145)

## 2019-06-18 MED ORDER — HYDRALAZINE HCL 20 MG/ML IJ SOLN
5.0000 mg | Freq: Once | INTRAMUSCULAR | Status: AC
  Administered 2019-06-19: 5 mg via INTRAVENOUS
  Filled 2019-06-18: qty 1

## 2019-06-18 MED ORDER — CHLORHEXIDINE GLUCONATE CLOTH 2 % EX PADS
6.0000 | MEDICATED_PAD | Freq: Every day | CUTANEOUS | Status: DC
  Administered 2019-06-18 – 2019-06-19 (×2): 6 via TOPICAL

## 2019-06-18 MED ORDER — SODIUM CHLORIDE 0.9% FLUSH: 10.0000 mL | INTRAVENOUS | Status: DC | PRN

## 2019-06-18 NOTE — Progress Notes (Signed)
Changed dressing on back to 4x4's with paper tape.

## 2019-06-18 NOTE — Progress Notes (Signed)
Spoke to floor RN regarding PICC placement. PICC still on until Na level 130. Will follow up.

## 2019-06-18 NOTE — Progress Notes (Signed)
[name] Kitchen  PROGRESS NOTE    David Bird  [id number] DOB: 1935/10/03 DOA: 06/09/2019 PCP: Ocie Doyne., MD   Brief Narrative:   David Bird a 83 years old with past with a history of CAD, CVA, hypertension and recent L3-L4 decompression in October presented with decreased appetite and weakness, he was found to have hyponatremia, MSSA bacteremia and metabolic acidosis.  11/23: Denies complaints this AM. He is eager to get home before the holidays. 11/24: No acute events ON. Na+ is better today.   Assessment & Plan:   Principal Problem:   MSSA bacteremia Active Problems:   Acute hyponatremia   Generalized weakness   Essential hypertension  MSSA bacteremia     - Bld Cx 11/15 growing MSSA, unclear source.     - Had recent lumbar fusion surgery done by Dr. Arnoldo Morale. Neurosurgery following. Surgical site healing well      - Bld Cx from 11/17 negative      - Appreciate ID. S/o'd: ancef/rifampin through 12/29, follow up in clinic in 4 weeks     - needs PICC for abx   S/p L3-4 decompression, instrumentation and fusion     - MRI showed soft tissue edema but no drainable abscess or fluid collection     - Dr. Arnoldo Morale consulted, recommend continue antibiotics     - Follow up with Dr. Arnoldo Morale in office in 1-2 weeks     - looks like still needs some monitoring in-house for possible further I&D; continue as per neurosurgery  Hyponatremia, hypotonic as well as component of SIADH      - Nephrology following; appreciate assistance     - continue demeclocycline, Lasix, salt tab     - Na+ up to 127 this AM; goal is ~130     - 11/24: Na+ is up to 131; spoke with nephro; holding demeclocycline, appreciate assitance  History of CVA     - continue crestor  HTN     - continue atenolol, lisinopril     - BP acceptable  BPH/urinary retention     - continue flomax, bethanechol  Generalized weakness     - Likely multifactorial secondary to MSSA bacteremia and severe  hyponatremia     - HHPT recommended   DVT prophylaxis: lovenox Code Status: FULL   Disposition Plan: To home with [facility]  Consultants:   Nephrology  ID  Neurosurgery  Antimicrobials:  . Ancef, rifampin   Subjective: "We are never going to fix all their problems."  Objective: Vitals:   06/18/19 0529 06/18/19 0838 06/18/19 0839 06/18/19 0840  BP: 134/78  (!) 141/75   Pulse: 64  69   Resp:   18   Temp: 97.9 F (36.6 C) 98 F (36.7 C)  97.7 F (36.5 C)  TempSrc: Oral Oral  Oral  SpO2: 98%  96%   Weight:        Intake/Output Summary (Last 24 hours) at 06/18/2019 1605 Last data filed at 06/18/2019 0900 Gross per 24 hour  Intake 562.74 ml  Output 300 ml  Net 262.74 ml   Filed Weights   06/12/19 0459 06/12/19 2101  Weight: 67.6 kg 67.7 kg    Examination:  General: 83 y.o. male resting in bed in NAD Cardiovascular: RRR, +S1, S2 Respiratory: CTABL, no w/r/r GI: BS+, NDNT, soft MSK: No e/c/c Neuro: alert and following commands Psyc: calm/cooperative   Data Reviewed: I have personally reviewed following labs and imaging studies.  CBC: Recent Labs  Lab 06/16/19  0515 06/18/19 0734  WBC 6.9 6.8  NEUTROABS  --  4.3  HGB 11.2* 11.5*  HCT 32.5* 34.4*  MCV 83.8 85.6  PLT 384 [id number]   Basic Metabolic Panel: Recent Labs  Lab 06/14/19 0723  06/15/19 0835 06/15/19 1457 06/16/19 0515 06/17/19 0912 06/18/19 0734  NA 123*   < > 123* 125* 126* 127* 131*  K 3.8  --  3.6  --  3.7 3.6 4.8  CL 87*  --  85*  --  86* 88* 93*  CO2 22  --  24  --  25 25 26   GLUCOSE 156*  --  160*  --  130* 205* 132*  BUN 20  --  23  --  21 22 18   CREATININE 1.07  --  1.13  --  1.03 1.17 1.12  CALCIUM 8.8*  --  8.7*  --  9.0 8.9 8.9  PHOS 3.9  --  3.5  --   --   --  3.4   < > = values in this interval not displayed.   GFR: Estimated Creatinine Clearance: 49.5 mL/min (by C-G formula based on SCr of 1.12 mg/dL). Liver Function Tests: Recent Labs  Lab 06/13/19 0506 06/14/19 0723  06/15/19 0835 06/18/19 0734  AST 26  --   --   --   ALT 21  --   --   --   ALKPHOS 92  --   --   --   BILITOT 1.3*  --   --   --   PROT 6.4*  --   --   --   ALBUMIN 2.6* 2.7* 2.8* 2.7*   No results for input(s): LIPASE, AMYLASE in the last 168 hours. No results for input(s): AMMONIA in the last 168 hours. Coagulation Profile: No results for input(s): INR, PROTIME in the last 168 hours. Cardiac Enzymes: No results for input(s): CKTOTAL, CKMB, CKMBINDEX, TROPONINI in the last 168 hours. BNP (last 3 results) No results for input(s): PROBNP in the last 8760 hours. HbA1C: No results for input(s): HGBA1C in the last 72 hours. CBG: No results for input(s): GLUCAP in the last 168 hours. Lipid Profile: No results for input(s): CHOL, HDL, LDLCALC, TRIG, CHOLHDL, LDLDIRECT in the last 72 hours. Thyroid Function Tests: No results for input(s): TSH, T4TOTAL, FREET4, T3FREE, THYROIDAB in the last 72 hours. Anemia Panel: No results for input(s): VITAMINB12, FOLATE, FERRITIN, TIBC, IRON, RETICCTPCT in the last 72 hours. Sepsis Labs: No results for input(s): PROCALCITON, LATICACIDVEN in the last 168 hours.  Recent Results (from the past 240 hour(s))  SARS CORONAVIRUS 2 (TAT 6-24 HRS) Nasopharyngeal Nasopharyngeal Swab     Status: None   Collection Time: 06/09/19  5:05 PM   Specimen: Nasopharyngeal Swab  Result Value Ref Range Status   SARS Coronavirus 2 NEGATIVE NEGATIVE Final    Comment: (NOTE) SARS-CoV-2 target nucleic acids are NOT DETECTED. The SARS-CoV-2 RNA is generally detectable in upper and lower respiratory specimens during the acute phase of infection. Negative results do not preclude SARS-CoV-2 infection, do not rule out co-infections with other pathogens, and should not be used as the sole basis for treatment or other patient management decisions. Negative results must be combined with clinical observations, patient history, and epidemiological information. The expected  result is Negative. Fact Sheet for Patients: SugarRoll.be Fact Sheet for Healthcare Providers: https://www.woods-mathews.com/ This test is not yet approved or cleared by the [place] FDA and  has been authorized for detection and/or diagnosis of SARS-CoV-2 by FDA  under an Emergency Use Authorization (EUA). This EUA will remain  in effect (meaning this test can be used) for the duration of the COVID-19 declaration under Section 56 4(b)(1) of the Act, 21 U.S.C. section 360bbb-3(b)(1), unless the authorization is terminated or revoked sooner. Performed at [facility], [place] [address] [place], [place] [address]   Culture, Urine     Status: None   Collection Time: 06/09/19  8:40 PM   Specimen: Urine, Random  Result Value Ref Range Status   Specimen Description URINE, RANDOM  Final   Special Requests NONE  Final   Culture   Final    NO GROWTH Performed at [facility], [place] [address] [place], [place] [address]    Report Status 06/10/2019 FINAL  Final  Culture, blood (routine x 2)     Status: Abnormal   Collection Time: 06/09/19  8:55 PM   Specimen: BLOOD LEFT ARM  Result Value Ref Range Status   Specimen Description BLOOD LEFT ARM  Final   Special Requests   Final    BOTTLES DRAWN AEROBIC AND ANAEROBIC Blood Culture adequate volume   Culture  Setup Time   Final    GRAM POSITIVE COCCI IN BOTH AEROBIC AND ANAEROBIC BOTTLES CRITICAL RESULT CALLED TO, READ BACK BY AND VERIFIED WITH: Hughie Closs [facility] 06/10/19 2036 JDW Performed at [facility], [place] [address], [place], [place] [address]    Culture STAPHYLOCOCCUS AUREUS (A)  Final   Report Status 06/12/2019 FINAL  Final   Organism ID, Bacteria STAPHYLOCOCCUS AUREUS  Final      Susceptibility   Staphylococcus aureus - MIC*    CIPROFLOXACIN <=0.5 SENSITIVE Sensitive     ERYTHROMYCIN >=8 RESISTANT Resistant     GENTAMICIN <=0.5 SENSITIVE Sensitive     OXACILLIN 0.5  SENSITIVE Sensitive     TETRACYCLINE <=1 SENSITIVE Sensitive     VANCOMYCIN <=0.5 SENSITIVE Sensitive     TRIMETH/SULFA <=10 SENSITIVE Sensitive     CLINDAMYCIN RESISTANT Resistant     RIFAMPIN <=0.5 SENSITIVE Sensitive     Inducible Clindamycin POSITIVE Resistant     * STAPHYLOCOCCUS AUREUS  Blood Culture ID Panel (Reflexed)     Status: Abnormal   Collection Time: 06/09/19  8:55 PM  Result Value Ref Range Status   Enterococcus species NOT DETECTED NOT DETECTED Final   Listeria monocytogenes NOT DETECTED NOT DETECTED Final   Staphylococcus species DETECTED (A) NOT DETECTED Final    Comment: CRITICAL RESULT CALLED TO, READ BACK BY AND VERIFIED WITH: Hughie Closs [facility] 06/10/19 2036 JDW    Staphylococcus aureus (BCID) DETECTED (A) NOT DETECTED Final    Comment: Methicillin (oxacillin) susceptible Staphylococcus aureus (MSSA). Preferred therapy is anti staphylococcal beta lactam antibiotic (Cefazolin or Nafcillin), unless clinically contraindicated. CRITICAL RESULT CALLED TO, READ BACK BY AND VERIFIED WITH: Hughie Closs [facility] 06/10/19 2036 JDW    Methicillin resistance NOT DETECTED NOT DETECTED Final   Streptococcus species NOT DETECTED NOT DETECTED Final   Streptococcus agalactiae NOT DETECTED NOT DETECTED Final   Streptococcus pneumoniae NOT DETECTED NOT DETECTED Final   Streptococcus pyogenes NOT DETECTED NOT DETECTED Final   Acinetobacter baumannii NOT DETECTED NOT DETECTED Final   Enterobacteriaceae species NOT DETECTED NOT DETECTED Final   Enterobacter cloacae complex NOT DETECTED NOT DETECTED Final   Escherichia coli NOT DETECTED NOT DETECTED Final   Klebsiella oxytoca NOT DETECTED NOT DETECTED Final   Klebsiella pneumoniae NOT DETECTED NOT DETECTED Final   Proteus species NOT DETECTED  NOT DETECTED Final   Serratia marcescens NOT DETECTED NOT DETECTED Final   Haemophilus influenzae NOT DETECTED NOT DETECTED Final   Neisseria meningitidis NOT DETECTED NOT DETECTED Final    Pseudomonas aeruginosa NOT DETECTED NOT DETECTED Final   Candida albicans NOT DETECTED NOT DETECTED Final   Candida glabrata NOT DETECTED NOT DETECTED Final   Candida krusei NOT DETECTED NOT DETECTED Final   Candida parapsilosis NOT DETECTED NOT DETECTED Final   Candida tropicalis NOT DETECTED NOT DETECTED Final    Comment: Performed at [facility], [place] [address] [place], [place] [address]  Culture, blood (routine x 2)     Status: Abnormal   Collection Time: 06/09/19  9:00 PM   Specimen: BLOOD LEFT HAND  Result Value Ref Range Status   Specimen Description BLOOD LEFT HAND  Final   Special Requests   Final    BOTTLES DRAWN AEROBIC ONLY Blood Culture adequate volume   Culture  Setup Time   Final    GRAM POSITIVE COCCI AEROBIC BOTTLE ONLY CRITICAL VALUE NOTED.  VALUE IS CONSISTENT WITH PREVIOUSLY REPORTED AND CALLED VALUE.    Culture (A)  Final    STAPHYLOCOCCUS AUREUS SUSCEPTIBILITIES PERFORMED ON PREVIOUS CULTURE WITHIN THE LAST 5 DAYS. Performed at [facility], [place] [address] [place], [place] [address]    Report Status 06/12/2019 FINAL  Final  Culture, blood (routine x 2)     Status: None   Collection Time: 06/11/19  1:03 PM   Specimen: BLOOD  Result Value Ref Range Status   Specimen Description BLOOD LEFT ANTECUBITAL  Final   Special Requests AEROBIC BOTTLE ONLY Blood Culture adequate volume  Final   Culture   Final    NO GROWTH 5 DAYS Performed at [place] [address] [place], [place] [address]    Report Status 06/16/2019 FINAL  Final  Culture, blood (routine x 2)     Status: None   Collection Time: 06/11/19  1:03 PM   Specimen: BLOOD  Result Value Ref Range Status   Specimen Description BLOOD LEFT ANTECUBITAL  Final   Special Requests AEROBIC BOTTLE ONLY Blood Culture adequate volume  Final   Culture   Final    NO GROWTH 5 DAYS Performed at [place] [address] [place], [place] [address]    Report Status 06/16/2019 FINAL   Final      Radiology Studies: No results found.   Scheduled Meds: . atenolol  50 mg Oral Daily  . bethanechol  25 mg Oral Daily  . docusate sodium  100 mg Oral BID  . enoxaparin (LOVENOX) injection  40 mg Subcutaneous Q24H  . ferrous sulfate  325 mg Oral BID WC  . rifampin  300 mg Oral Q12H  . rosuvastatin  5 mg Oral Daily  . sodium chloride flush  3 mL Intravenous Once  . sodium chloride  1 g Oral Q8H  . tamsulosin  0.4 mg Oral QPC supper   Continuous Infusions: . sodium chloride 250 mL (06/18/19 0540)  .  ceFAZolin (ANCEF) IV 2 g (06/18/19 1443)     LOS: 9 days    Time spent: 25 minutes spent in the coordination of care today.    Jonnie Finner, DO [facility] Pager [phone]  If 7PM-7AM, please contact night-coverage [email or web address] Password [email or web address] 06/18/2019, 4:05 PM

## 2019-06-18 NOTE — Plan of Care (Signed)
?  Problem: Clinical Measurements: ?Goal: Will remain free from infection ?Outcome: Progressing ?  ?

## 2019-06-18 NOTE — TOC Progression Note (Addendum)
Transition of Care [facility]) - Progression Note    Patient Details  Name: David Bird MRN: [id number] Date of Birth: 04-03-1936  Transition of [facility]) CM/SW Contact  Bartholomew Crews, RN Phone Number: [phone] 06/18/2019, 4:45 PM  Clinical Narrative:    Spoke with patient and son at the bedside to discuss difficulties with finding [place] PT. Telephone call to [facility] to find additional in network facilities, but [facility] could not provide any agencies that NCM had not already checked. Reached out to [facility] to request [facility] PT only - will check with [place] branch for possible consideration. [employer] advised that an out of network agency could request a gap exception by calling [phone] option 3. TOC following for transition needs.   Update: Received call from [facility]. If patient able to dc tomorrow, please dc early so [facility] can see patient at 3pm. If no dc tomorrow, no HH RN until weekend.    Expected Discharge Plan: [place] Barriers to Discharge: Continued Medical Work up  Expected Discharge Plan and Services Expected Discharge Plan: [place]   Discharge Planning Services: CM Consult   Living arrangements for the past 2 months: Single Family Home Expected Discharge Date: 06/11/19                                     Social Determinants of Health (SDOH) Interventions    Readmission Risk Interventions No flowsheet data found.

## 2019-06-18 NOTE — Progress Notes (Signed)
Peripherally Inserted Central Catheter/Midline Placement  The IV Nurse has discussed with the patient and/or persons authorized to consent for the patient, the purpose of this procedure and the potential benefits and risks involved with this procedure.  The benefits include less needle sticks, lab draws from the catheter, and the patient may be discharged home with the catheter. Risks include, but not limited to, infection, bleeding, blood clot (thrombus formation), and puncture of an artery; nerve damage and irregular heartbeat and possibility to perform a PICC exchange if needed/ordered by physician.  Alternatives to this procedure were also discussed.  Bard Power PICC patient education guide, fact sheet on infection prevention and patient information card has been provided to patient /or left at bedside.    PICC/Midline Placement Documentation  PICC Single Lumen 06/18/19 PICC Right Brachial 40 cm 0 cm (Active)  Indication for Insertion or Continuance of Line Home intravenous therapies (PICC only) 06/18/19 1843  Exposed Catheter (cm) 0 cm 06/18/19 1843  Site Assessment Clean;Dry;Intact 06/18/19 1843  Line Status Flushed;Blood return noted;Saline locked 06/18/19 1843  Dressing Type Transparent 06/18/19 1843  Dressing Status Clean;Dry;Intact;Antimicrobial disc in place 06/18/19 1843  Dressing Change Due 06/25/19 06/18/19 1843       Scotty Court 06/18/2019, 6:44 PM

## 2019-06-18 NOTE — Progress Notes (Signed)
PT Cancellation Note  Patient Details Name: David Bird MRN: [id number] DOB: Jan 29, 1936   Cancelled Treatment:    Reason Eval/Treat Not Completed: Patient declined, no reason specified.  Asked PT to come back later in the day, is resting in bed comfortably.  Will retry as time and pt allow.   Ramond Dial 06/18/2019, 11:00 AM   Mee Hives, PT MS Acute Rehab Dept. Number: [place] and [place]

## 2019-06-18 NOTE — Progress Notes (Signed)
Physical Therapy Treatment Patient Details Name: David Bird MRN: [id number] DOB: [birth year] Today's Date: 06/18/2019    History of Present Illness Pt is an 83 y/o male who had a L3-L4 TLIF on 05/20/2019. Pt d/c home with son and reports he was progressing well so transitioned home alone where he became weak, lost his appetite, and was laying in bed ~2 days before calling son to come get him.  PMH significant for CVA, HTN, DMII, CAD s/p stent, CA, partial gastrectomy (~1960), R ankle fracture with plate fixation.     PT Comments    Pt was seen for mobility and strength testing, and re-instructed him with back precautions and application of back brace.  Pt is distracted with his son in the room, but is there to observe and then was being instructed by nursing on his father's care when PT and pt returned from gait.  Pt is at a level to be supervised at home to walk and exercise, and will benefit from HHPT for continued education of back care and strengthening for recovery of independence.  Follow Up Recommendations  Home health PT;Supervision for mobility/OOB     Equipment Recommendations  None recommended by PT    Recommendations for Other Services       Precautions / Restrictions Precautions Precautions: Fall;Back Precaution Booklet Issued: No Precaution Comments: reviewed back precautions during functional mobility. (knows 2/3) Required Braces or Orthoses: Spinal Brace Spinal Brace: Lumbar corset;Applied in sitting position Restrictions Weight Bearing Restrictions: No    Mobility  Bed Mobility Overal bed mobility: Modified Independent             General bed mobility comments: maintains back precautions with supine>sit with hospital bed features, does not fully maintain back precautions with sit>supine  Transfers Overall transfer level: Needs assistance Equipment used: Rolling walker (2 wheeled) Transfers: Sit to/from Stand Sit to Stand: Supervision          General transfer comment: requires reminders about safety, tends to be impulsive and ignore IV line  Ambulation/Gait Ambulation/Gait assistance: Supervision Gait Distance (Feet): 225 Feet Assistive device: Rolling walker (2 wheeled) Gait Pattern/deviations: Step-through pattern;Trunk flexed Gait velocity: Decreased Gait velocity interpretation: <1.31 ft/sec, indicative of household ambulator General Gait Details: 3 standing rest breaks but overall tolerated activity well. VC's for improved posture    Stairs Stairs: (declined, reports he has no stairs to manage at home)           Wheelchair Mobility    Modified Rankin (Stroke Patients Only)       Balance Overall balance assessment: Modified Independent Sitting-balance support: Feet supported Sitting balance-Leahy Scale: Good     Standing balance support: Bilateral upper extremity supported;During functional activity Standing balance-Leahy Scale: Good Standing balance comment: cues for balance, readjusted brace in standing                            Cognition Arousal/Alertness: Awake/alert Behavior During Therapy: WFL for tasks assessed/performed Overall Cognitive Status: Within Functional Limits for tasks assessed                                        Exercises      General Comments General comments (skin integrity, edema, etc.): Pt was quick to pull off brace and reinstructed him to take off properly to avoid twisting upon re-application  Pertinent Vitals/Pain Pain Assessment: No/denies pain    Home Living                      Prior Function            PT Goals (current goals can now be found in the care plan section) Acute Rehab PT Goals Patient Stated Goal: feel better Progress towards PT goals: Progressing toward goals    Frequency    Min 3X/week      PT Plan Current plan remains appropriate    Co-evaluation              AM-PAC PT "6  Clicks" Mobility   Outcome Measure  Help needed turning from your back to your side while in a flat bed without using bedrails?: None Help needed moving from lying on your back to sitting on the side of a flat bed without using bedrails?: A Little Help needed moving to and from a bed to a chair (including a wheelchair)?: A Little Help needed standing up from a chair using your arms (e.g., wheelchair or bedside chair)?: A Little Help needed to walk in hospital room?: A Little Help needed climbing 3-5 steps with a railing? : A Little 6 Click Score: 19    End of Session Equipment Utilized During Treatment: Gait belt;Back brace Activity Tolerance: Patient tolerated treatment well Patient left: in bed;with call bell/phone within reach Nurse Communication: Mobility status PT Visit Diagnosis: Muscle weakness (generalized) (M62.81)     Time: [id number] PT Time Calculation (min) (ACUTE ONLY): 23 min  Charges:  $Gait Training: 8-22 mins $Therapeutic Activity: 8-22 mins                    Ramond Dial 06/18/2019, 3:56 PM   Mee Hives, PT MS Acute Rehab Dept. Number: [place] and [place]

## 2019-06-18 NOTE — Progress Notes (Addendum)
Subjective: The patient is alert and pleasant.  He is in no apparent distress.  Objective: Vital signs in last 24 hours: Temp:  [97.7 F (36.5 C)-98.2 F (36.8 C)] 97.9 F (36.6 C) (11/24 0529) Pulse Rate:  [62-67] 64 (11/24 0529) Resp:  [17-18] 17 (11/23 2105) BP: (120-151)/(67-96) 134/78 (11/24 0529) SpO2:  [95 %-98 %] 98 % (11/24 0529) Estimated body mass index is 21.42 kg/m as calculated from the following:   Height as of 05/20/19: 5\' 10"  (1.778 m).   Weight as of this encounter: 67.7 kg.   Intake/Output from previous day: 11/23 0701 - 11/24 0700 In: 742.7 [P.O.:640; I.V.:2.7; IV Piggyback:100] Out: 550 [Urine:550] Intake/Output this shift: No intake/output data recorded.  Physical exam patient is alert and oriented.  He is moving all 4 extremities well.  The patient's wound has some purulent drainage.  Again an occlusive dressing has been applied.  Lab Results: Recent Labs    06/16/19 0515  WBC 6.9  HGB 11.2*  HCT 32.5*  PLT 384   BMET Recent Labs    06/16/19 0515 06/17/19 0912  NA 126* 127*  K 3.7 3.6  CL 86* 88*  CO2 25 25  GLUCOSE 130* 205*  BUN 21 22  CREATININE 1.03 1.17  CALCIUM 9.0 8.9    Studies/Results: No results found.  Assessment/Plan: Wound infection: I have spoken with the patient's nurse and ordered gauze and tape dressings and discontinue occlusive dressings.  More than likely this drainage will stop in a few days.  We will plan to continue the antibiotics for 6 weeks.  If his drainage persists we may need to do an eye MD  Hyponatremia: Noted  LOS: 9 days     Ophelia Charter 06/18/2019, 8:00 AM

## 2019-06-18 NOTE — Progress Notes (Signed)
[facility] Progress Note    Assessment/ Plan:   1. Hyponatremia:  Initially looked like hypotonic hyponatremia, exacerbated by HCTZ, after volume resuscitation mixed pic developed with SIADH as well.  SIADH thought to be predominating--> but not improving with fluid restrict/ salt tabs/ Lasix as expected, think that excessive intake of free water at play here.  Have discussed with nursing staff-- will reinforce fluid restriction.  Demeclocycline 150 mg BID added and now Na up to 126.  Would be OK with discharge when Na close to 130.  - Discussed fluid and free water restriction - he is off of lasix IV BID - euvolemic  - For now continue current regimen - on salt tabs.  Discontinue Demeclocycline for now - once discharged will need follow-up with [facility] in one week with labs  2. MSSA bacteremia: on cefazolin, rifampin added-- MRI without hardware infection, ID on board, rec 6 weeks of IV antibiotics. Neurosurgery following.   3. HTN: controlled; have stopped HCTZ and have stopped lisinopril; please do not resume HCTZ given the hyponatremia  4. SP recent lumbar decompression surg--> NSG following, no indication for surgical intervention 5. DM per primary team  6. Constipation- resolved, had a BM 7. Dispo: pending rx plan for MSSA bacteremia   Subjective:    Patient reports he has had dark stool - hasn't discussed with team - I have paged them and discussed.  We discussed fluid and free water restriction.  Seen by neurosurgery and may need an I&D.  Spoke with primary team and they are keeping him to watch his incision.  Review of systems:   Denies shortness of breath or chest pain  Denies nausea or vomiting  PO intake ok      Objective:   BP (!) 141/75 (BP Location: Left Arm)   Pulse 69   Temp 97.7 F (36.5 C) (Oral)   Resp 18   Wt 67.7 kg   SpO2 96%   BMI 21.42 kg/m   Intake/Output Summary (Last 24 hours) at 06/18/2019 1002 Last data filed at 06/18/2019  0540 Gross per 24 hour  Intake 622.74 ml  Output 400 ml  Net 222.74 ml   Weight change:   Physical Exam: Gen: adult male in bed in NAD CVS: RRR no m/r/g Resp: clear and unlabored Abd: soft and nontender, nondistended Ext: no LE edema NEURO: AAO x 3 Psych - normal mood and affect  Imaging: No results found.  Labs: BMET Recent Labs  Lab 06/12/19 0529  06/13/19 0506 06/14/19 0723 06/14/19 1240 06/15/19 0835 06/15/19 1457 06/16/19 0515 06/17/19 0912 06/18/19 0734  NA 124*   < > 125* 123* 127* 123* 125* 126* 127* 131*  K 4.4  --  4.0 3.8  --  3.6  --  3.7 3.6 4.8  CL 88*  --  87* 87*  --  85*  --  86* 88* 93*  CO2 23  --  23 22  --  24  --  25 25 26   GLUCOSE 108*  --  113* 156*  --  160*  --  130* 205* 132*  BUN 11  --  18 20  --  23  --  21 22 18   CREATININE 0.91  --  1.03 1.07  --  1.13  --  1.03 1.17 1.12  CALCIUM 8.7*  --  8.8* 8.8*  --  8.7*  --  9.0 8.9 8.9  PHOS  --   --   --  3.9  --  3.5  --   --   --  3.4   < > = values in this interval not displayed.   CBC Recent Labs  Lab 06/16/19 0515 06/18/19 0734  WBC 6.9 6.8  NEUTROABS  --  4.3  HGB 11.2* 11.5*  HCT 32.5* 34.4*  MCV 83.8 85.6  PLT 384 374    Medications:    . atenolol  50 mg Oral Daily  . bethanechol  25 mg Oral Daily  . demeclocycline  150 mg Oral Q12H  . docusate sodium  100 mg Oral BID  . enoxaparin (LOVENOX) injection  40 mg Subcutaneous Q24H  . ferrous sulfate  325 mg Oral BID WC  . rifampin  300 mg Oral Q12H  . rosuvastatin  5 mg Oral Daily  . sodium chloride flush  3 mL Intravenous Once  . sodium chloride  1 g Oral Q8H  . tamsulosin  0.4 mg Oral QPC supper    Claudia Desanctis 06/18/2019, 10:02 AM

## 2019-06-19 DIAGNOSIS — B9561 Methicillin susceptible Staphylococcus aureus infection as the cause of diseases classified elsewhere: Secondary | ICD-10-CM | POA: Diagnosis not present

## 2019-06-19 LAB — CBC WITH DIFFERENTIAL/PLATELET
Abs Immature Granulocytes: 0.13 10*3/uL — ABNORMAL HIGH (ref 0.00–0.07)
Basophils Absolute: 0.1 10*3/uL (ref 0.0–0.1)
Basophils Relative: 1 %
Eosinophils Absolute: 0.1 10*3/uL (ref 0.0–0.5)
Eosinophils Relative: 2 %
HCT: 33.7 % — ABNORMAL LOW (ref 39.0–52.0)
Hemoglobin: 11.2 g/dL — ABNORMAL LOW (ref 13.0–17.0)
Immature Granulocytes: 3 %
Lymphocytes Relative: 24 %
Lymphs Abs: 1.3 10*3/uL (ref 0.7–4.0)
MCH: 28.9 pg (ref 26.0–34.0)
MCHC: 33.2 g/dL (ref 30.0–36.0)
MCV: 87.1 fL (ref 80.0–100.0)
Monocytes Absolute: 0.7 10*3/uL (ref 0.1–1.0)
Monocytes Relative: 12 %
Neutro Abs: 3.1 10*3/uL (ref 1.7–7.7)
Neutrophils Relative %: 58 %
Platelets: 414 10*3/uL — ABNORMAL HIGH (ref 150–400)
RBC: 3.87 MIL/uL — ABNORMAL LOW (ref 4.22–5.81)
RDW: 13.2 % (ref 11.5–15.5)
WBC: 5.3 10*3/uL (ref 4.0–10.5)
nRBC: 0 % (ref 0.0–0.2)

## 2019-06-19 LAB — MAGNESIUM: Magnesium: 1.8 mg/dL (ref 1.7–2.4)

## 2019-06-19 LAB — RENAL FUNCTION PANEL
Albumin: 2.7 g/dL — ABNORMAL LOW (ref 3.5–5.0)
Anion gap: 11 (ref 5–15)
BUN: 15 mg/dL (ref 8–23)
CO2: 24 mmol/L (ref 22–32)
Calcium: 8.8 mg/dL — ABNORMAL LOW (ref 8.9–10.3)
Chloride: 95 mmol/L — ABNORMAL LOW (ref 98–111)
Creatinine, Ser: 1.01 mg/dL (ref 0.61–1.24)
GFR calc Af Amer: >60 mL/min (ref >60)
GFR calc non Af Amer: >60 mL/min (ref >60)
Glucose, Bld: 110 mg/dL — ABNORMAL HIGH (ref 70–99)
Phosphorus: 3.5 mg/dL (ref 2.5–4.6)
Potassium: 4.6 mmol/L (ref 3.5–5.1)
Sodium: 130 mmol/L — ABNORMAL LOW (ref 135–145)

## 2019-06-19 MED ORDER — DEMECLOCYCLINE HCL 150 MG PO TABS: 150.0000 mg | ORAL_TABLET | Freq: Two times a day (BID) | ORAL | 0 refills | Status: DC

## 2019-06-19 MED ORDER — SODIUM CHLORIDE 1 G PO TABS: 1.0000 g | ORAL_TABLET | Freq: Three times a day (TID) | ORAL | 0 refills | Status: AC

## 2019-06-19 MED ORDER — DEMECLOCYCLINE HCL 150 MG PO TABS
150.0000 mg | ORAL_TABLET | Freq: Two times a day (BID) | ORAL | Status: DC
  Administered 2019-06-19: 150 mg via ORAL
  Filled 2019-06-19 (×2): qty 1

## 2019-06-19 NOTE — Progress Notes (Signed)
Subjective: The patient is alert and pleasant.  He wants to go home.  Objective: Vital signs in last 24 hours: Temp:  [97.6 F (36.4 C)-98.1 F (36.7 C)] 97.6 F (36.4 C) (11/25 0527) Pulse Rate:  [61-65] 65 (11/25 0527) Resp:  [18] 18 (11/25 0527) BP: (128-173)/(66-87) 128/71 (11/25 0527) SpO2:  [97 %] 97 % (11/25 0527) Estimated body mass index is 21.42 kg/m as calculated from the following:   Height as of 05/20/19: 5\' 10"  (1.778 m).   Weight as of this encounter: 67.7 kg.   Intake/Output from previous day: 11/24 0701 - 11/25 0700 In: 636.6 [P.O.:300; I.V.:36.8; IV Piggyback:299.9] Out: 895 [Urine:895] Intake/Output this shift: No intake/output data recorded.  Physical exam the patient is alert and oriented x3.  He is moving all 4 extremities well.  The patient's lumbar wound continues to have some purulent drainage.  I could not express anything today.  Lab Results: Recent Labs    06/18/19 0734 06/19/19 0436  WBC 6.8 5.3  HGB 11.5* 11.2*  HCT 34.4* 33.7*  PLT 374 414*   BMET Recent Labs    06/18/19 0734 06/19/19 0436  NA 131* 130*  K 4.8 4.6  CL 93* 95*  CO2 26 24  GLUCOSE 132* 110*  BUN 18 15  CREATININE 1.12 1.01  CALCIUM 8.9 8.8*    Studies/Results: No results found.  Assessment/Plan: Staff wound infection: From my point of view he can be discharged with twice daily and as needed dressing changes.  Continue IV antibiotics via his PICC line.  Please have him follow-up with me in my office on 06/25/2019 for a wound check.  He may need an I&D if the drainage does not stop by then.  I have answered all his questions.  LOS: 10 days     Ophelia Charter 06/19/2019, 9:42 AM

## 2019-06-19 NOTE — Progress Notes (Signed)
David Bird to be discharged Home  per MD order. Discussed prescriptions and follow up appointments with the patient. Prescriptions given to patient; medication list explained in detail. Patient verbalized understanding.  Skin clean, dry and intact without evidence of skin break down, no evidence of skin tears noted. IV catheter discontinued intact. Site without signs and symptoms of complications. Dressing and pressure applied. Pt denies pain at the site currently. No complaints noted.  Patient free of lines, drains, and wounds.   An After Visit Summary (AVS) was printed and given to the patient. Patient escorted via wheelchair, and discharged home via private auto.  Shela Commons, RN

## 2019-06-19 NOTE — Progress Notes (Signed)
[facility] Progress Note    Assessment/ Plan:   1. Hyponatremia:  Initially looked like hypotonic hyponatremia, exacerbated by HCTZ, after volume resuscitation mixed pic developed with SIADH as well.  SIADH thought to be predominating--> but not improving with fluid restrict/ salt tabs/ Lasix as expected, think that excessive intake of free water at play here.  Have discussed with nursing staff-- will reinforce fluid restriction.  Demeclocycline 150 mg BID added - stopped on 11/24 and Na from 131 to 130 - Discussed fluid and free water restriction - For now continue current regimen - on salt tabs.  As it appears he will be discharged on 11/25 would go ahead and resume Demeclocycline 150 mg BID as this has been his regimen for several days  - Will set up follow-up with [facility] in one week with labs and reasessment  2. MSSA bacteremia: on cefazolin, rifampin added-- MRI without hardware infection, ID on board, rec 6 weeks of IV antibiotics. Neurosurgery following.   3. HTN: controlled; have stopped HCTZ and have stopped lisinopril; please do not resume HCTZ given the hyponatremia  4. SP recent lumbar decompression surg--> NSG following, no indication for surgical intervention 5. DM per primary team 6. Dispo: from a renal standpoint able to be discharged   Subjective:    He has continued inpatient for monitoring of his incision.  He was seen by neurosurgery this morning and from their point of view is able to be discharged.  The patient provides the number for his son [phone] and [phone] (patient's number).  I spoke with his son via phone and his sodium is usually 133-135 (up to 137 per their outpatient records).  We discussed free water and fluid restriction.   Review of systems:   Denies shortness of breath or chest pain  Denies nausea or vomiting  PO intake ok      Objective:   BP 128/71 (BP Location: Left Arm)   Pulse 65   Temp 97.6 F (36.4 C) (Oral)   Resp  18   Wt 67.7 kg   SpO2 97%   BMI 21.42 kg/m   Intake/Output Summary (Last 24 hours) at 06/19/2019 [id number] Last data filed at 06/19/2019 [id number] Gross per 24 hour  Intake 576.62 ml  Output 895 ml  Net -318.38 ml   Weight change:   Physical Exam:  Gen: adult male in bed in NAD CVS: RRR no m/r/g Resp: clear and unlabored Abd: soft and nontender, nondistended Ext: no LE edema NEURO: AAO x 3 Psych - normal mood and affect  Imaging: No results found.  Labs: BMET Recent Labs  Lab 06/13/19 0506 06/14/19 0723 06/14/19 1240 06/15/19 0835 06/15/19 1457 06/16/19 0515 06/17/19 0912 06/18/19 0734 06/19/19 0436  NA 125* 123* 127* 123* 125* 126* 127* 131* 130*  K 4.0 3.8  --  3.6  --  3.7 3.6 4.8 4.6  CL 87* 87*  --  85*  --  86* 88* 93* 95*  CO2 23 22  --  24  --  25 25 26 24   GLUCOSE 113* 156*  --  160*  --  130* 205* 132* 110*  BUN 18 20  --  23  --  21 22 18 15   CREATININE 1.03 1.07  --  1.13  --  1.03 1.17 1.12 1.01  CALCIUM 8.8* 8.8*  --  8.7*  --  9.0 8.9 8.9 8.8*  PHOS  --  3.9  --  3.5  --   --   --  3.4 3.5   CBC Recent Labs  Lab 06/16/19 0515 06/18/19 0734 06/19/19 0436  WBC 6.9 6.8 5.3  NEUTROABS  --  4.3 3.1  HGB 11.2* 11.5* 11.2*  HCT 32.5* 34.4* 33.7*  MCV 83.8 85.6 87.1  PLT 384 374 414*    Medications:    . atenolol  50 mg Oral Daily  . bethanechol  25 mg Oral Daily  . Chlorhexidine Gluconate Cloth  6 each Topical Daily  . docusate sodium  100 mg Oral BID  . enoxaparin (LOVENOX) injection  40 mg Subcutaneous Q24H  . ferrous sulfate  325 mg Oral BID WC  . rifampin  300 mg Oral Q12H  . rosuvastatin  5 mg Oral Daily  . sodium chloride flush  3 mL Intravenous Once  . sodium chloride  1 g Oral Q8H  . tamsulosin  0.4 mg Oral QPC supper    Claudia Desanctis 06/19/2019, 9:27 AM

## 2019-06-19 NOTE — TOC Transition Note (Signed)
Transition of Care [facility]) - CM/SW Discharge Note   Patient Details  Name: David Bird MRN: [id number] Date of Birth: 06-18-1936  [facility] CM/SW Contact:  Zenon Mayo, RN Phone Number: 06/19/2019, 10:28 AM   Clinical Narrative:    NCM spoke with Tommi Rumps with [facility] , he states they are able to take patient for HHPT.  NCM spoke with Carolynn Sayers with [facility], she states they are ready for patient, [facility] will be at patient's home at 3 pm today to do the infusion.  NCM informed son of this information and Staff RN on unit.     Final next level of care: [place] Barriers to Discharge: No Barriers Identified   Patient Goals and CMS Choice Patient states their goals for this hospitalization and ongoing recovery are:: home with CMS Medicare.gov Compare Post Acute Care list provided to:: Patient Represenative (must comment) Choice offered to / list presented to : Adult Children  Discharge Placement                       Discharge Plan and Services   Discharge Planning Services: CM Consult            DME Arranged: (NA)         HH Arranged: RN, PT HH Agency: [facility] for iv abx) Date HH Agency Contacted: 06/19/19 Time [place]: 1028 Representative spoke with at [place]: [place] ([place]) Interventions     Readmission Risk Interventions No flowsheet data found.

## 2019-06-19 NOTE — Discharge Summary (Signed)
Physician Discharge Summary  David Bird:[id number] DOB: 07/18/1936 DOA: 06/09/2019  PCP: Ocie Doyne., MD  Admit date: 06/09/2019 Discharge date: 06/19/2019  Admitted From: Home Disposition:  Home with home health   Recommendations for Outpatient Follow-up:  1. Follow up with PCP in 1 week 2. Follow up with Dr. Arnoldo Morale on 06/25/2019. Continue twice daily and as needed dressing changes  3. Follow up with Nephrology in 1 week with repeat BMP  4.   Follow up with ID on 07/16/2019  Discharge Condition: Stable CODE STATUS: Full  Diet recommendation: 1245m fluid restriction, free water restriction    Brief/Interim Summary: From H&P per Dr. TFlossie Buffy  HPI: David ENRIQUEis a 83y.o. male with medical history significant of history of lymphoma, CAD, CVA, hypertension, hyperlipidemia, neurogenic claudication s/p L3-4 decompression in October who presents with concerns of decreased appetite and weakness.  Son at bedside provides most the history.  He reports that patient recently had back surgery about 2 weeks ago and was living with him for 2 weeks and was otherwise well.  Patient then returned back to living on his own and son called him 2 days ago and realized he had been laying in bed for 2 days due to weakness.  He then saw urology for follow-up of his BPH 2 days ago and reportedly had lab work that showed dehydration but no UTI.  Then Friday night he saw his neurosurgeon for follow-up and reportedly had no issues with surgical site. No worsening back or leg pain since surgery. Son brought him back to his house and attempted to give him more food and fluid but decided to bring him in since he was showing some mild confusion and increase pallor.  He also notes that patient has been having decreased urinary output more than usual despite being treated on bethanechol by urology.  Patient notes that he had dysuria about 3 to 4 days ago but this has since resolved.  Denies any fever.  Denies  any nausea, vomiting, diarrhea or abdominal pain. No family hx related to current symptom that would be contributory.   ED Course: He had temperature of 99.8 and was normotensive on room air. CBC showed no leukocytosis but had anemia of 11.1 which was not seen 3 weeks ago. Sodium of 117, glucose of 205, creatinine normal at 1.22, AST of 58 and ALT of 45. Anion gap of 17.  Chest XR negative.  EKG shows first degree AV block.   Interim: Patient was diagnosed with MSSA bacteremia.  Infectious disease as well as neurosurgery evaluated patient.  Patient was treated with IV antibiotics per recommendation from infectious disease.  Patient also developed hyponatremia and was evaluated by nephrology.  On day of discharge, patient had PICC line placed.  Had recommendations from neurosurgery for a wound check in his office.  Sodium improved to 130 and was cleared from discharge from nephrology standpoint.  Patient was feeling well on the day of discharge, had all of his questions and concerns addressed.  He will discharge with home health.  Discharge Diagnoses:  Principal Problem:   MSSA bacteremia Active Problems:   Acute hyponatremia   Generalized weakness   Essential hypertension   MSSA bacteremia -Blood culture 11/15 growing MSSA, unclear source. -Had recent lumbar fusion surgery done by Dr. JArnoldo Morale Neurosurgery following. Surgical site healing well  -Repeat blood culture 11/17 negative  -Appreciate ID. Signed off 11/19, follow up in clinic in 4 weeks -Ancef and rifampin through 12/29 -  PICC placed   S/p L3-4 decompression, instrumentation and fusion -MRI showed soft tissue edema but no drainable abscess or fluid collection -Dr. Arnoldo Morale consulted, recommend continue antibiotics -Follow up with Dr. Arnoldo Morale in office 06/25/2019   Hyponatremia, hypotonic as well as component of SIADH  -Nephrology following -Declomycin, salt tab -Trend Na. 130 this morning.   History of CVA -Continue  Crestor  HTN -Continue Tenormin, lisinopril  BPH/urinary retention -Continue on Flomax, bethanechol  Generalized weakness -Likely multifactorial secondary to MSSA bacteremia and severe hyponatremia -Home health recommended    Discharge Instructions  Discharge Instructions    Call MD for:  difficulty breathing, headache or visual disturbances   Complete by: As directed    Call MD for:  extreme fatigue   Complete by: As directed    Call MD for:  persistant dizziness or light-headedness   Complete by: As directed    Call MD for:  persistant nausea and vomiting   Complete by: As directed    Call MD for:  redness, tenderness, or signs of infection (pain, swelling, redness, odor or green/yellow discharge around incision site)   Complete by: As directed    Call MD for:  severe uncontrolled pain   Complete by: As directed    Call MD for:  temperature >100.4   Complete by: As directed    Change dressing (specify)   Complete by: As directed    Dressing change: twice daily and as needed   Discharge instructions   Complete by: As directed    You were cared for by a hospitalist during your hospital stay. If you have any questions about your discharge medications or the care you received while you were in the hospital after you are discharged, you can call the unit and ask to speak with the hospitalist on call if the hospitalist that took care of you is not available. Once you are discharged, your primary care physician will handle any further medical issues. Please note that NO REFILLS for any discharge medications will be authorized once you are discharged, as it is imperative that you return to your primary care physician (or establish a relationship with a primary care physician if you do not have one) for your aftercare needs so that they can reassess your need for medications and monitor your lab values.   Home infusion instructions [facility] May follow [place] Dosing  Protocol; May administer Cathflo as needed to maintain patency of vascular access device.; Flushing of vascular access device: per [facility] Protocol: 0.9% NaCl pre/post medica...   Complete by: As directed    Instructions: May follow [place] Dosing Protocol   Instructions: May administer Cathflo as needed to maintain patency of vascular access device.   Instructions: Flushing of vascular access device: per [facility] Protocol: 0.9% NaCl pre/post medication administration and prn patency; Heparin 100 u/ml, 42m for implanted ports and Heparin 10u/ml, 555mfor all other central venous catheters.   Instructions: May follow [facility] Anaphylaxis Protocol for First Dose Administration in the home: 0.9% NaCl at 25-50 ml/hr to maintain IV access for protocol meds. Epinephrine 0.3 ml IV/IM PRN and Benadryl 25-50 IV/IM PRN s/s of anaphylaxis.   Instructions: [facility] Coordinator (RN) to assist per patient IV care needs in the home PRN.   Increase activity slowly   Complete by: As directed    Other Restrictions   Complete by: As directed    Fluid and free water restriction     Allergies as  of 06/19/2019      Reactions   Penicillins Rash   Did it involve swelling of the face/tongue/throat, SOB, or low BP? No Did it involve sudden or severe rash/hives, skin peeling, or any reaction on the inside of your mouth or nose? No Did you need to seek medical attention at a hospital or doctor's office? Yes When did it last happen?40 years ago If all above answers are "NO", may proceed with cephalosporin use.      Medication List    STOP taking these medications   cephALEXin 500 MG capsule Commonly known as: KEFLEX   cyclobenzaprine 10 MG tablet Commonly known as: FLEXERIL   lisinopril-hydrochlorothiazide 20-12.5 MG tablet Commonly known as: ZESTORETIC   nitrofurantoin (macrocrystal-monohydrate) 100 MG capsule Commonly known as: MACROBID     TAKE these medications   atenolol 50 MG  tablet Commonly known as: TENORMIN Take 50 mg by mouth daily.   bethanechol 25 MG tablet Commonly known as: URECHOLINE Take 25 mg by mouth daily.   ceFAZolin  IVPB Commonly known as: ANCEF Inject 2 g into the vein every 8 (eight) hours. Indication:  MSSA Bacteremia with concern for osteomyelitis  Last Day of Therapy:  07/23/2019 Labs - Once weekly:  CBC/D and BMP, Labs - Every other week:  ESR and CRP   demeclocycline 150 MG tablet Commonly known as: DECLOMYCIN Take 1 tablet (150 mg total) by mouth every 12 (twelve) hours.   docusate sodium 100 MG capsule Commonly known as: COLACE Take 1 capsule (100 mg total) by mouth 2 (two) times daily.   metFORMIN 500 MG 24 hr tablet Commonly known as: GLUCOPHAGE-XR Take 500 mg by mouth 2 (two) times daily as needed (CBG >150).   oxyCODONE 5 MG immediate release tablet Commonly known as: Oxy IR/ROXICODONE Take 1 tablet (5 mg total) by mouth every 3 (three) hours as needed for moderate pain ((score 4 to 6)). What changed: how much to take   rifampin 300 MG capsule Commonly known as: RIFADIN Take 1 capsule (300 mg total) by mouth 2 (two) times daily.   rosuvastatin 5 MG tablet Commonly known as: CRESTOR Take 5 mg by mouth daily.   sodium chloride 1 g tablet Take 1 tablet (1 g total) by mouth every 8 (eight) hours.   tamsulosin 0.4 MG Caps capsule Commonly known as: FLOMAX Take 0.4 mg by mouth daily after supper.   TYLENOL PO Take 2 tablets by mouth 2 (two) times daily as needed (pain).   vitamin C 1000 MG tablet Take 1,000 mg by mouth every other day.            Home Infusion Instuctions  (From admission, onward)         Start     Ordered   06/14/19 0000  Home infusion instructions [facility] May follow [place] Dosing Protocol; May administer Cathflo as needed to maintain patency of vascular access device.; Flushing of vascular access device: per [facility] Protocol: 0.9% NaCl pre/post medica...    Question  Answer Comment  Instructions May follow [place] Dosing Protocol   Instructions May administer Cathflo as needed to maintain patency of vascular access device.   Instructions Flushing of vascular access device: per [facility] Protocol: 0.9% NaCl pre/post medication administration and prn patency; Heparin 100 u/ml, 50m for implanted ports and Heparin 10u/ml, 568mfor all other central venous catheters.   Instructions May follow [facility] Anaphylaxis Protocol for First Dose Administration in the home: 0.9% NaCl at 25-50  ml/hr to maintain IV access for protocol meds. Epinephrine 0.3 ml IV/IM PRN and Benadryl 25-50 IV/IM PRN s/s of anaphylaxis.   Instructions [facility] Infusion Coordinator (RN) to assist per patient IV care needs in the home PRN.      06/14/19 1441           Discharge Care Instructions  (From admission, onward)         Start     Ordered   06/19/19 0000  Change dressing (specify)    Comments: Dressing change: twice daily and as needed   06/19/19 1013         Follow-up Information    [place], FNP Follow up.   Specialties: Family Medicine, Infectious Diseases Why: 07/16/19 at 9:45 am.  Please call to reschedule if you are unable to make this appointment Contact information: [address] [place] [address] [phone]        [facility] Follow up.   Specialty: [place] information: [address] [place] [phone]       [place] Follow up.   Contact information: [facility] [address] [place], [place] [address] [phone]       Newman Pies, MD. Go on 06/25/2019.   Specialty: Neurosurgery Contact information: [address] [place] [address] [phone]        [place], [place]. Schedule an appointment as soon as possible for a visit in 1 week(s).   Contact  information: [place] [address] [phone]          Allergies  Allergen Reactions  . Penicillins Rash    Did it involve swelling of the face/tongue/throat, SOB, or low BP? No Did it involve sudden or severe rash/hives, skin peeling, or any reaction on the inside of your mouth or nose? No Did you need to seek medical attention at a hospital or doctor's office? Yes When did it last happen?40 years ago If all above answers are "NO", may proceed with cephalosporin use.     Consultations:  Nephrology  Infectious disease  Neurosurgery   Procedures/Studies: Dg Lumbar Spine 2-3 Views  Result Date: 05/20/2019 CLINICAL DATA:  Surgical posterior fusion of L3-4. EXAM: DG C-ARM 1-60 MIN; LUMBAR SPINE - 2-3 VIEW CONTRAST:  None. FLUOROSCOPY TIME:  Fluoroscopy Time:  13 seconds. Number of Acquired Spot Images: 2. COMPARISON:  MRI of May 13, 2019. FINDINGS: Two intraoperative fluoroscopic images were obtained of the lumbar spine. These images demonstrate the patient to be status post surgical posterior fusion of L3-4 with bilateral intrapedicular screw placement and interbody fusion. Good alignment of vertebral bodies is noted. IMPRESSION: Fluoroscopic guidance provided during surgical posterior fusion of L3-4. Electronically Signed   By: Marijo Conception M.D.   On: 05/20/2019 14:30   Mr Lumbar Spine Wo Contrast  Result Date: 06/11/2019 CLINICAL DATA:  83 year old male status post L3-L4 fusion last month. Back pain with infection suspected. EXAM: MRI LUMBAR SPINE WITHOUT CONTRAST TECHNIQUE: Multiplanar, multisequence MR imaging of the lumbar spine was performed. No intravenous contrast was administered. COMPARISON:  Preoperative lumbar MRI 05/13/2019. FINDINGS: Segmentation: Lumbar segmentation appears to be normal and is the same numbering system used in October. Alignment: Regressed grade 1 spondylolisthesis at L3-L4 following surgery. Stable mild retrolisthesis at  L1-L2. Stable vertebral height and alignment otherwise. Vertebrae: Continued mild degenerative appearing endplate marrow edema in the  lower thoracic spine at T11-T12 and T12-L1. Hardware susceptibility artifact at L3-L4. Posterior decompression at that level. No convincing marrow edema or evidence of acute osseous abnormality. Intact visible sacrum and SI joints. Conus medullaris and cauda equina: Conus extends to the T12-L1 level. No lower spinal cord or conus signal abnormality. Paraspinal and other soft tissues: Stable visible abdominal viscera. Confluent STIR hyperintensity in the lumbar rect or spine I muscles and throughout the operative bed at the L3-L4 level (series 6 images 6-10). At the laminectomy site on series 8, image 14 there appears to be generalized soft tissue edema. There is mild associated mass effect on the thecal sac (see below). However, in the absence of IV contrast there is no discrete paraspinal fluid collection. The psoas musculature remains normal. Disc levels: Unchanged degeneration from the lower thoracic spine through L1-L2. L2-L3: Stable circumferential disc bulge. Mildly increased posterior element hypertrophy. Subsequent increased mild to moderate spinal stenosis (series 8, image 9). Stable mild foraminal stenosis greater on the right. L3-L4: Interval decompression and fusion. Edematous posterior soft tissue with mild mass effect on the thecal sac resulting in mild spinal stenosis (series 8, image 15), although thecal sac patency has increased since 05/13/2019. Superimposed granulation tissue including at the right lateral recess and right L3 neural foramen. L4-L5 and L5-S1 levels are stable. IMPRESSION: 1. Extensive soft tissue edema in and around the L3-L4 laminectomy site does raise the possibility of a soft tissue infection. But there is no abscess or fluid collection identified in the absence of IV contrast, and no evidence of osteomyelitis. 2. Soft tissue edema resulting in  mild mass effect on the posterior thecal sac at L3-L4. Mild spinal stenosis there although thecal sac patency has improved since the preoperative MRI. 3. Increased mild to moderate multifactorial spinal stenosis at L2-L3 appears related to interval posterior element hypertrophy. 4. Other lumbar levels are stable. Electronically Signed   By: Genevie Ann M.D.   On: 06/11/2019 22:59   Dg Lumbar Spine 1 View  Result Date: 05/20/2019 CLINICAL DATA:  Lumbar localization image EXAM: LUMBAR SPINE - 1 VIEW COMPARISON:  Lumbar spine radiograph 04/16/2019, lumbar MRI 05/13/2019 FINDINGS: Surgical implement is directed towards the lamina of L5 at the level of the L4-5 interspace. Multilevel discogenic and facet degenerative changes are noted throughout the abdomen. Extensive post surgical material is noted in the upper abdomen. Vascular calcium is present in the aorta. IMPRESSION: Surgical implement directed towards the lamina of L5 at the L4-5 interspace. Electronically Signed   By: Lovena Le M.D.   On: 05/20/2019 16:37   Dg Chest Port 1 View  Result Date: 06/09/2019 CLINICAL DATA:  Weakness EXAM: PORTABLE CHEST 1 VIEW COMPARISON:  02/18/2012 FINDINGS: The heart size and mediastinal contours are within normal limits. Both lungs are clear. Aortic atherosclerosis. The visualized skeletal structures are unremarkable. IMPRESSION: No active disease. Electronically Signed   By: Donavan Foil M.D.   On: 06/09/2019 16:48   Dg C-arm 1-60 Min  Result Date: 05/20/2019 CLINICAL DATA:  Surgical posterior fusion of L3-4. EXAM: DG C-ARM 1-60 MIN; LUMBAR SPINE - 2-3 VIEW CONTRAST:  None. FLUOROSCOPY TIME:  Fluoroscopy Time:  13 seconds. Number of Acquired Spot Images: 2. COMPARISON:  MRI of May 13, 2019. FINDINGS: Two intraoperative fluoroscopic images were obtained of the lumbar spine. These images demonstrate the patient to be status post surgical posterior fusion of L3-4 with bilateral intrapedicular screw placement and  interbody fusion. Good alignment of vertebral bodies is noted. IMPRESSION: Fluoroscopic  guidance provided during surgical posterior fusion of L3-4. Electronically Signed   By: Marijo Conception M.D.   On: 05/20/2019 14:30   [place] Ekg Site Rite  Result Date: 06/14/2019 If Site Rite image not attached, placement could not be confirmed due to current cardiac rhythm.       Discharge Exam: Vitals:   06/18/19 2336 06/19/19 0527  BP: (!) 173/87 128/71  Pulse: 62 65  Resp:  18  Temp: 98.1 F (36.7 C) 97.6 F (36.4 C)  SpO2:  97%     General: Pt is alert, awake, not in acute distress Cardiovascular: RRR, S1/S2 +, no edema Respiratory: CTA bilaterally, no wheezing, no rhonchi, no respiratory distress, no conversational dyspnea  Abdominal: Soft, NT, ND, bowel sounds + Extremities: no edema, no cyanosis Psych: Normal mood and affect, stable judgement and insight     The results of significant diagnostics from this hospitalization (including imaging, microbiology, ancillary and laboratory) are listed below for reference.     Microbiology: Recent Results (from the past 240 hour(s))  SARS CORONAVIRUS 2 (TAT 6-24 HRS) Nasopharyngeal Nasopharyngeal Swab     Status: None   Collection Time: 06/09/19  5:05 PM   Specimen: Nasopharyngeal Swab  Result Value Ref Range Status   SARS Coronavirus 2 NEGATIVE NEGATIVE Final    Comment: (NOTE) SARS-CoV-2 target nucleic acids are NOT DETECTED. The SARS-CoV-2 RNA is generally detectable in upper and lower respiratory specimens during the acute phase of infection. Negative results do not preclude SARS-CoV-2 infection, do not rule out co-infections with other pathogens, and should not be used as the sole basis for treatment or other patient management decisions. Negative results must be combined with clinical observations, patient history, and epidemiological information. The expected result is Negative. Fact Sheet for  Patients: SugarRoll.be Fact Sheet for Healthcare Providers: https://www.woods-mathews.com/ This test is not yet approved or cleared by the [place] FDA and  has been authorized for detection and/or diagnosis of SARS-CoV-2 by FDA under an Emergency Use Authorization (EUA). This EUA will remain  in effect (meaning this test can be used) for the duration of the COVID-19 declaration under Section 56 4(b)(1) of the Act, 21 U.S.C. section 360bbb-3(b)(1), unless the authorization is terminated or revoked sooner. Performed at [facility], [place] [address] [place], [place] [address]   Culture, Urine     Status: None   Collection Time: 06/09/19  8:40 PM   Specimen: Urine, Random  Result Value Ref Range Status   Specimen Description URINE, RANDOM  Final   Special Requests NONE  Final   Culture   Final    NO GROWTH Performed at [facility], [place] [address], [place], [place] [address]    Report Status 06/10/2019 FINAL  Final  Culture, blood (routine x 2)     Status: Abnormal   Collection Time: 06/09/19  8:55 PM   Specimen: BLOOD LEFT ARM  Result Value Ref Range Status   Specimen Description BLOOD LEFT ARM  Final   Special Requests   Final    BOTTLES DRAWN AEROBIC AND ANAEROBIC Blood Culture adequate volume   Culture  Setup Time   Final    GRAM POSITIVE COCCI IN BOTH AEROBIC AND ANAEROBIC BOTTLES CRITICAL RESULT CALLED TO, READ BACK BY AND VERIFIED WITH: Hughie Closs [facility] 06/10/19 2036 JDW Performed at [facility], [place] [address], [place], [place] [address]    Culture STAPHYLOCOCCUS AUREUS (A)  Final   Report Status 06/12/2019 FINAL  Final   Organism ID, Bacteria STAPHYLOCOCCUS AUREUS  Final      Susceptibility   Staphylococcus aureus - MIC*    CIPROFLOXACIN <=0.5 SENSITIVE Sensitive     ERYTHROMYCIN >=8 RESISTANT Resistant     GENTAMICIN <=0.5 SENSITIVE Sensitive     OXACILLIN 0.5 SENSITIVE Sensitive     TETRACYCLINE <=1  SENSITIVE Sensitive     VANCOMYCIN <=0.5 SENSITIVE Sensitive     TRIMETH/SULFA <=10 SENSITIVE Sensitive     CLINDAMYCIN RESISTANT Resistant     RIFAMPIN <=0.5 SENSITIVE Sensitive     Inducible Clindamycin POSITIVE Resistant     * STAPHYLOCOCCUS AUREUS  Blood Culture ID Panel (Reflexed)     Status: Abnormal   Collection Time: 06/09/19  8:55 PM  Result Value Ref Range Status   Enterococcus species NOT DETECTED NOT DETECTED Final   Listeria monocytogenes NOT DETECTED NOT DETECTED Final   Staphylococcus species DETECTED (A) NOT DETECTED Final    Comment: CRITICAL RESULT CALLED TO, READ BACK BY AND VERIFIED WITH: Hughie Closs [facility] 06/10/19 2036 JDW    Staphylococcus aureus (BCID) DETECTED (A) NOT DETECTED Final    Comment: Methicillin (oxacillin) susceptible Staphylococcus aureus (MSSA). Preferred therapy is anti staphylococcal beta lactam antibiotic (Cefazolin or Nafcillin), unless clinically contraindicated. CRITICAL RESULT CALLED TO, READ BACK BY AND VERIFIED WITH: Hughie Closs [facility] 06/10/19 2036 JDW    Methicillin resistance NOT DETECTED NOT DETECTED Final   Streptococcus species NOT DETECTED NOT DETECTED Final   Streptococcus agalactiae NOT DETECTED NOT DETECTED Final   Streptococcus pneumoniae NOT DETECTED NOT DETECTED Final   Streptococcus pyogenes NOT DETECTED NOT DETECTED Final   Acinetobacter baumannii NOT DETECTED NOT DETECTED Final   Enterobacteriaceae species NOT DETECTED NOT DETECTED Final   Enterobacter cloacae complex NOT DETECTED NOT DETECTED Final   Escherichia coli NOT DETECTED NOT DETECTED Final   Klebsiella oxytoca NOT DETECTED NOT DETECTED Final   Klebsiella pneumoniae NOT DETECTED NOT DETECTED Final   Proteus species NOT DETECTED NOT DETECTED Final   Serratia marcescens NOT DETECTED NOT DETECTED Final   Haemophilus influenzae NOT DETECTED NOT DETECTED Final   Neisseria meningitidis NOT DETECTED NOT DETECTED Final   Pseudomonas aeruginosa NOT DETECTED NOT DETECTED  Final   Candida albicans NOT DETECTED NOT DETECTED Final   Candida glabrata NOT DETECTED NOT DETECTED Final   Candida krusei NOT DETECTED NOT DETECTED Final   Candida parapsilosis NOT DETECTED NOT DETECTED Final   Candida tropicalis NOT DETECTED NOT DETECTED Final    Comment: Performed at [facility], [place]. [address] [place], [place] [address]  Culture, blood (routine x 2)     Status: Abnormal   Collection Time: 06/09/19  9:00 PM   Specimen: BLOOD LEFT HAND  Result Value Ref Range Status   Specimen Description BLOOD LEFT HAND  Final   Special Requests   Final    BOTTLES DRAWN AEROBIC ONLY Blood Culture adequate volume   Culture  Setup Time   Final    GRAM POSITIVE COCCI AEROBIC BOTTLE ONLY CRITICAL VALUE NOTED.  VALUE IS CONSISTENT WITH PREVIOUSLY REPORTED AND CALLED VALUE.    Culture (A)  Final    STAPHYLOCOCCUS AUREUS SUSCEPTIBILITIES PERFORMED ON PREVIOUS CULTURE WITHIN THE LAST 5 DAYS. Performed at [facility] Lab, [place] [address], [place], [place] [address]    Report Status 06/12/2019 FINAL  Final  Culture, blood (routine x 2)     Status: None   Collection Time: 06/11/19  1:03 PM   Specimen: BLOOD  Result Value Ref Range Status   Specimen Description BLOOD LEFT ANTECUBITAL  Final   Special Requests AEROBIC BOTTLE ONLY Blood Culture adequate volume  Final   Culture   Final    NO GROWTH 5 DAYS Performed at [facility], [address], [place], [place] [address]    Report Status 06/16/2019 FINAL  Final  Culture, blood (routine x 2)     Status: None   Collection Time: 06/11/19  1:03 PM   Specimen: BLOOD  Result Value Ref Range Status   Specimen Description BLOOD LEFT ANTECUBITAL  Final   Special Requests AEROBIC BOTTLE ONLY Blood Culture adequate volume  Final   Culture   Final    NO GROWTH 5 DAYS Performed at [place] [address], [place], [place] [address]    Report Status 06/16/2019 FINAL  Final     Labs: BNP (last 3 results) No  results for input(s): BNP in the last 8760 hours. Basic Metabolic Panel: Recent Labs  Lab 06/14/19 0723  06/15/19 0835 06/15/19 1457 06/16/19 0515 06/17/19 0912 06/18/19 0734 06/19/19 0436  NA 123*   < > 123* 125* 126* 127* 131* 130*  K 3.8  --  3.6  --  3.7 3.6 4.8 4.6  CL 87*  --  85*  --  86* 88* 93* 95*  CO2 22  --  24  --  25 25 26 24   GLUCOSE 156*  --  160*  --  130* 205* 132* 110*  BUN 20  --  23  --  21 22 18 15   CREATININE 1.07  --  1.13  --  1.03 1.17 1.12 1.01  CALCIUM 8.8*  --  8.7*  --  9.0 8.9 8.9 8.8*  MG  --   --   --   --   --   --   --  1.8  PHOS 3.9  --  3.5  --   --   --  3.4 3.5   < > = values in this interval not displayed.   Liver Function Tests: Recent Labs  Lab 06/13/19 0506 06/14/19 0723 06/15/19 0835 06/18/19 0734 06/19/19 0436  AST 26  --   --   --   --   ALT 21  --   --   --   --   ALKPHOS 92  --   --   --   --   BILITOT 1.3*  --   --   --   --   PROT 6.4*  --   --   --   --   ALBUMIN 2.6* 2.7* 2.8* 2.7* 2.7*   No results for input(s): LIPASE, AMYLASE in the last 168 hours. No results for input(s): AMMONIA in the last 168 hours. CBC: Recent Labs  Lab 06/16/19 0515 06/18/19 0734 06/19/19 0436  WBC 6.9 6.8 5.3  NEUTROABS  --  4.3 3.1  HGB 11.2* 11.5* 11.2*  HCT 32.5* 34.4* 33.7*  MCV 83.8 85.6 87.1  PLT 384 374 414*   Cardiac Enzymes: No results for input(s): CKTOTAL, CKMB, CKMBINDEX, TROPONINI in the last 168 hours. BNP: Invalid input(s): POCBNP CBG: No results for input(s): GLUCAP in the last 168 hours. D-Dimer No results for input(s): DDIMER in the last 72 hours. Hgb A1c No results for input(s): HGBA1C in the last 72 hours. Lipid Profile No results for input(s): CHOL, HDL, LDLCALC, TRIG, CHOLHDL, LDLDIRECT in the last 72 hours. Thyroid function studies No results for  input(s): TSH, T4TOTAL, T3FREE, THYROIDAB in the last 72 hours.  Invalid input(s): FREET3 Anemia work up No results for input(s): VITAMINB12, FOLATE,  FERRITIN, TIBC, IRON, RETICCTPCT in the last 72 hours. Urinalysis    Component Value Date/Time   COLORURINE YELLOW 06/09/2019 2101   APPEARANCEUR CLEAR 06/09/2019 2101   LABSPEC 1.006 06/09/2019 2101   PHURINE 6.0 06/09/2019 2101   GLUCOSEU NEGATIVE 06/09/2019 2101   HGBUR NEGATIVE 06/09/2019 2101   [place] NEGATIVE 06/09/2019 2101   [place] NEGATIVE 06/09/2019 2101   PROTEINUR NEGATIVE 06/09/2019 2101   NITRITE NEGATIVE 06/09/2019 2101   LEUKOCYTESUR NEGATIVE 06/09/2019 2101   Sepsis Labs Invalid input(s): PROCALCITONIN,  WBC,  LACTICIDVEN Microbiology Recent Results (from the past 240 hour(s))  SARS CORONAVIRUS 2 (TAT 6-24 HRS) Nasopharyngeal Nasopharyngeal Swab     Status: None   Collection Time: 06/09/19  5:05 PM   Specimen: Nasopharyngeal Swab  Result Value Ref Range Status   SARS Coronavirus 2 NEGATIVE NEGATIVE Final    Comment: (NOTE) SARS-CoV-2 target nucleic acids are NOT DETECTED. The SARS-CoV-2 RNA is generally detectable in upper and lower respiratory specimens during the acute phase of infection. Negative results do not preclude SARS-CoV-2 infection, do not rule out co-infections with other pathogens, and should not be used as the sole basis for treatment or other patient management decisions. Negative results must be combined with clinical observations, patient history, and epidemiological information. The expected result is Negative. Fact Sheet for Patients: SugarRoll.be Fact Sheet for Healthcare Providers: https://www.woods-mathews.com/ This test is not yet approved or cleared by the [place] FDA and  has been authorized for detection and/or diagnosis of SARS-CoV-2 by FDA under an Emergency Use Authorization (EUA). This EUA will remain  in effect (meaning this test can be used) for the duration of the COVID-19 declaration under Section 56 4(b)(1) of the Act, 21 U.S.C. section 360bbb-3(b)(1), unless the  authorization is terminated or revoked sooner. Performed at [facility], [place] [address], [place], [place] [address]   Culture, Urine     Status: None   Collection Time: 06/09/19  8:40 PM   Specimen: Urine, Random  Result Value Ref Range Status   Specimen Description URINE, RANDOM  Final   Special Requests NONE  Final   Culture   Final    NO GROWTH Performed at [facility], [place] [address] [place], [place] [address]    Report Status 06/10/2019 FINAL  Final  Culture, blood (routine x 2)     Status: Abnormal   Collection Time: 06/09/19  8:55 PM   Specimen: BLOOD LEFT ARM  Result Value Ref Range Status   Specimen Description BLOOD LEFT ARM  Final   Special Requests   Final    BOTTLES DRAWN AEROBIC AND ANAEROBIC Blood Culture adequate volume   Culture  Setup Time   Final    GRAM POSITIVE COCCI IN BOTH AEROBIC AND ANAEROBIC BOTTLES CRITICAL RESULT CALLED TO, READ BACK BY AND VERIFIED WITH: Hughie Closs [facility] 06/10/19 2036 JDW Performed at [facility], [place] [address], [place], [place] [address]    Culture STAPHYLOCOCCUS AUREUS (A)  Final   Report Status 06/12/2019 FINAL  Final   Organism ID, Bacteria STAPHYLOCOCCUS AUREUS  Final      Susceptibility   Staphylococcus aureus - MIC*    CIPROFLOXACIN <=0.5 SENSITIVE Sensitive     ERYTHROMYCIN >=8 RESISTANT Resistant     GENTAMICIN <=0.5 SENSITIVE Sensitive     OXACILLIN 0.5 SENSITIVE Sensitive  TETRACYCLINE <=1 SENSITIVE Sensitive     VANCOMYCIN <=0.5 SENSITIVE Sensitive     TRIMETH/SULFA <=10 SENSITIVE Sensitive     CLINDAMYCIN RESISTANT Resistant     RIFAMPIN <=0.5 SENSITIVE Sensitive     Inducible Clindamycin POSITIVE Resistant     * STAPHYLOCOCCUS AUREUS  Blood Culture ID Panel (Reflexed)     Status: Abnormal   Collection Time: 06/09/19  8:55 PM  Result Value Ref Range Status   Enterococcus species NOT DETECTED NOT DETECTED Final   Listeria monocytogenes NOT DETECTED NOT DETECTED Final   Staphylococcus  species DETECTED (A) NOT DETECTED Final    Comment: CRITICAL RESULT CALLED TO, READ BACK BY AND VERIFIED WITH: Hughie Closs [facility] 06/10/19 2036 JDW    Staphylococcus aureus (BCID) DETECTED (A) NOT DETECTED Final    Comment: Methicillin (oxacillin) susceptible Staphylococcus aureus (MSSA). Preferred therapy is anti staphylococcal beta lactam antibiotic (Cefazolin or Nafcillin), unless clinically contraindicated. CRITICAL RESULT CALLED TO, READ BACK BY AND VERIFIED WITH: Hughie Closs [facility] 06/10/19 2036 JDW    Methicillin resistance NOT DETECTED NOT DETECTED Final   Streptococcus species NOT DETECTED NOT DETECTED Final   Streptococcus agalactiae NOT DETECTED NOT DETECTED Final   Streptococcus pneumoniae NOT DETECTED NOT DETECTED Final   Streptococcus pyogenes NOT DETECTED NOT DETECTED Final   Acinetobacter baumannii NOT DETECTED NOT DETECTED Final   Enterobacteriaceae species NOT DETECTED NOT DETECTED Final   Enterobacter cloacae complex NOT DETECTED NOT DETECTED Final   Escherichia coli NOT DETECTED NOT DETECTED Final   Klebsiella oxytoca NOT DETECTED NOT DETECTED Final   Klebsiella pneumoniae NOT DETECTED NOT DETECTED Final   Proteus species NOT DETECTED NOT DETECTED Final   Serratia marcescens NOT DETECTED NOT DETECTED Final   Haemophilus influenzae NOT DETECTED NOT DETECTED Final   Neisseria meningitidis NOT DETECTED NOT DETECTED Final   Pseudomonas aeruginosa NOT DETECTED NOT DETECTED Final   Candida albicans NOT DETECTED NOT DETECTED Final   Candida glabrata NOT DETECTED NOT DETECTED Final   Candida krusei NOT DETECTED NOT DETECTED Final   Candida parapsilosis NOT DETECTED NOT DETECTED Final   Candida tropicalis NOT DETECTED NOT DETECTED Final    Comment: Performed at [facility], [place], [place], [place] [address]  Culture, blood (routine x 2)     Status: Abnormal   Collection Time: 06/09/19  9:00 PM   Specimen: BLOOD LEFT HAND  Result Value Ref Range Status    Specimen Description BLOOD LEFT HAND  Final   Special Requests   Final    BOTTLES DRAWN AEROBIC ONLY Blood Culture adequate volume   Culture  Setup Time   Final    GRAM POSITIVE COCCI AEROBIC BOTTLE ONLY CRITICAL VALUE NOTED.  VALUE IS CONSISTENT WITH PREVIOUSLY REPORTED AND CALLED VALUE.    Culture (A)  Final    STAPHYLOCOCCUS AUREUS SUSCEPTIBILITIES PERFORMED ON PREVIOUS CULTURE WITHIN THE LAST 5 DAYS. Performed at [facility], [place] [address] [place], [place] [address]    Report Status 06/12/2019 FINAL  Final  Culture, blood (routine x 2)     Status: None   Collection Time: 06/11/19  1:03 PM   Specimen: BLOOD  Result Value Ref Range Status   Specimen Description BLOOD LEFT ANTECUBITAL  Final   Special Requests AEROBIC BOTTLE ONLY Blood Culture adequate volume  Final   Culture   Final    NO GROWTH 5 DAYS Performed at [place] [address], [place], [place] [address]    Report  Status 06/16/2019 FINAL  Final  Culture, blood (routine x 2)     Status: None   Collection Time: 06/11/19  1:03 PM   Specimen: BLOOD  Result Value Ref Range Status   Specimen Description BLOOD LEFT ANTECUBITAL  Final   Special Requests AEROBIC BOTTLE ONLY Blood Culture adequate volume  Final   Culture   Final    NO GROWTH 5 DAYS Performed at [place] [address] [place], [place] [address]    Report Status 06/16/2019 FINAL  Final     Patient was seen and examined on the day of discharge and was found to be in stable condition. Time coordinating discharge: 35 minutes including assessment and coordination of care, as well as examination of the patient.   SIGNED:  Dessa Phi, DO [facility] 06/19/2019, 10:16 AM

## 2019-06-24 ENCOUNTER — Other Ambulatory Visit: Payer: Self-pay

## 2019-06-24 DIAGNOSIS — L739 Follicular disorder, unspecified: Secondary | ICD-10-CM | POA: Diagnosis not present

## 2019-06-24 DIAGNOSIS — B9561 Methicillin susceptible Staphylococcus aureus infection as the cause of diseases classified elsewhere: Secondary | ICD-10-CM | POA: Diagnosis not present

## 2019-06-24 NOTE — Patient Outreach (Signed)
Red Emmi: Alert:  Discharge papers- no  Reviewed medical record. Placed call to patient who answered and identified himself. Patient reports that he is feeling great. Reports that he has his discharge papers and that he has a follow up planned with the surgeon tomorrow.  Reviewed fluid restriction with patient.    Patient denied any new problems or concerns at this time  PLAN: Case closure as no additional follow up needed.  Tomasa Rand, RN, BSN, CEN [facility] [employer] [phone]

## 2019-06-29 DIAGNOSIS — B9561 Methicillin susceptible Staphylococcus aureus infection as the cause of diseases classified elsewhere: Secondary | ICD-10-CM | POA: Diagnosis not present

## 2019-07-02 DIAGNOSIS — B9561 Methicillin susceptible Staphylococcus aureus infection as the cause of diseases classified elsewhere: Secondary | ICD-10-CM | POA: Diagnosis not present

## 2019-07-02 DIAGNOSIS — T8140XS Infection following a procedure, unspecified, sequela: Secondary | ICD-10-CM | POA: Diagnosis not present

## 2019-07-02 DIAGNOSIS — L739 Follicular disorder, unspecified: Secondary | ICD-10-CM | POA: Diagnosis not present

## 2019-07-06 DIAGNOSIS — B9561 Methicillin susceptible Staphylococcus aureus infection as the cause of diseases classified elsewhere: Secondary | ICD-10-CM | POA: Diagnosis not present

## 2019-07-08 DIAGNOSIS — B9561 Methicillin susceptible Staphylococcus aureus infection as the cause of diseases classified elsewhere: Secondary | ICD-10-CM | POA: Diagnosis not present

## 2019-07-08 DIAGNOSIS — L739 Follicular disorder, unspecified: Secondary | ICD-10-CM | POA: Diagnosis not present

## 2019-07-13 DIAGNOSIS — B9561 Methicillin susceptible Staphylococcus aureus infection as the cause of diseases classified elsewhere: Secondary | ICD-10-CM | POA: Diagnosis not present

## 2019-07-16 ENCOUNTER — Inpatient Hospital Stay: Payer: Medicare Other | Admitting: Family

## 2019-07-16 DIAGNOSIS — T8140XS Infection following a procedure, unspecified, sequela: Secondary | ICD-10-CM | POA: Diagnosis not present

## 2019-07-16 DIAGNOSIS — B9561 Methicillin susceptible Staphylococcus aureus infection as the cause of diseases classified elsewhere: Secondary | ICD-10-CM | POA: Diagnosis not present

## 2019-07-16 DIAGNOSIS — L739 Follicular disorder, unspecified: Secondary | ICD-10-CM | POA: Diagnosis not present

## 2019-07-19 DIAGNOSIS — B9561 Methicillin susceptible Staphylococcus aureus infection as the cause of diseases classified elsewhere: Secondary | ICD-10-CM | POA: Diagnosis not present

## 2019-07-22 ENCOUNTER — Encounter: Payer: Self-pay | Admitting: Family

## 2019-07-22 ENCOUNTER — Ambulatory Visit (INDEPENDENT_AMBULATORY_CARE_PROVIDER_SITE_OTHER): Payer: Medicare Other | Admitting: Family

## 2019-07-22 ENCOUNTER — Telehealth: Payer: Self-pay

## 2019-07-22 ENCOUNTER — Other Ambulatory Visit: Payer: Self-pay

## 2019-07-22 VITALS — BP 207/96 | HR 63 | Temp 97.7°F | Wt 165.4 lb

## 2019-07-22 DIAGNOSIS — B9561 Methicillin susceptible Staphylococcus aureus infection as the cause of diseases classified elsewhere: Secondary | ICD-10-CM

## 2019-07-22 DIAGNOSIS — I2510 Atherosclerotic heart disease of native coronary artery without angina pectoris: Secondary | ICD-10-CM | POA: Diagnosis not present

## 2019-07-22 DIAGNOSIS — E871 Hypo-osmolality and hyponatremia: Secondary | ICD-10-CM | POA: Diagnosis not present

## 2019-07-22 DIAGNOSIS — I10 Essential (primary) hypertension: Secondary | ICD-10-CM

## 2019-07-22 DIAGNOSIS — R7881 Bacteremia: Secondary | ICD-10-CM

## 2019-07-22 MED ORDER — AMLODIPINE BESYLATE 10 MG PO TABS: 10.0000 mg | ORAL_TABLET | Freq: Every day | ORAL | 2 refills | Status: DC

## 2019-07-22 NOTE — Assessment & Plan Note (Signed)
David Bird has poorly controlled blood pressure with no red flag or warning symptoms including headache or changes in vision.  Recently lisinopril was discontinued and currently on atenolol.  We will start Norvasc while he awaits appointment with new PCP.  If blood pressure continues to remain elevated may need to go to the hospital or if symptoms of headache or blurred vision occur.  Encouraged to monitor blood pressure at home and follow low-sodium diet.

## 2019-07-22 NOTE — Telephone Encounter (Signed)
Per Marya Amsler, NP; contacted [facility] (Debbie/Lynn) to extend patients antibiotic therapy an additional 2 days (07/26/2019). Labs to be drawn as scheduled. Additional orders to be placed after review of lab work.   Raney Antwine Lorita Officer, RN

## 2019-07-22 NOTE — Progress Notes (Signed)
Subjective:    Patient ID: David Bird, male    DOB: March 05, 1936, 83 y.o.   MRN: [id number]  Chief Complaint  Patient presents with  . New Patient (Initial Visit)    no complaints; reports elevated BP since being taken off Lisinopril. Will recheck prior to leaving; declined flu shot     HPI:  David Bird is a 83 y.o. male with previous medical history of coronary artery disease, type 2 diabetes, dyslipidemia, hypertension, lymphoma status post treatment with chemo and radiation and spinal stenosis status post lumbar fusion on 05/20/2019 who was recently admitted to the hospital with concerns of decreased appetite and weakness.  David Bird was found to have MSSA bacteremia and 3 of 4 bottles with most likely source of infection being his lumbar spine wound.  Neurosurgery was consulted with no neurosurgical intervention required.  Blood cultures cleared rapidly.  Initial sed rate of 90 and CRP of 13.2.  Started on cefazolin supplemented with rifampin given recent hardware placement.  TTE was without evidence of endocarditis.  Plan of care included cefazolin and rifampin for 6 weeks through 07/23/2019.  Most recent CRP on 11/30 of 32.1 down to 26.8 on 12/14.  Sedimentation rate of greater than 140 on 06/24/2019 down to 63 on 07/08/2019.  David Bird continues to take his cefazolin and rifampin as prescribed with no adverse side effects or missed doses.  Describes it as a hassle at times and is eager to complete his antibiotic therapy.  Denies any systemic symptoms of fevers, chills, or sweats.  Previous surgical incision has healed with no drainage.  PICC line functioning appropriately with no evidence of infection.  Overall feeling well today.   Allergies  Allergen Reactions  . Penicillins Rash    Did it involve swelling of the face/tongue/throat, SOB, or low BP? No Did it involve sudden or severe rash/hives, skin peeling, or any reaction on the inside of your mouth or nose?  No Did you need to seek medical attention at a hospital or doctor's office? Yes When did it last happen?40 years ago If all above answers are "NO", may proceed with cephalosporin use.       Outpatient Medications Prior to Visit  Medication Sig Dispense Refill  . ceFAZolin (ANCEF) IVPB Inject 2 g into the vein every 8 (eight) hours. Indication:  MSSA Bacteremia with concern for osteomyelitis  Last Day of Therapy:  07/23/2019 Labs - Once weekly:  CBC/D and BMP, Labs - Every other week:  ESR and CRP 120 Units 0  . rifampin (RIFADIN) 300 MG capsule Take 1 capsule (300 mg total) by mouth 2 (two) times daily. 80 capsule 0  . Acetaminophen (TYLENOL PO) Take 2 tablets by mouth 2 (two) times daily as needed (pain).    . Ascorbic Acid (VITAMIN C) 1000 MG tablet Take 1,000 mg by mouth every other day.    [name] Kitchen atenolol (TENORMIN) 50 MG tablet Take 50 mg by mouth daily.    . bethanechol (URECHOLINE) 25 MG tablet Take 25 mg by mouth daily.     [name] Kitchen ceFAZolin (ANCEF) 10 g injection     . demeclocycline (DECLOMYCIN) 150 MG tablet Take 1 tablet (150 mg total) by mouth every 12 (twelve) hours. (Patient not taking: Reported on 07/22/2019) 60 tablet 0  . docusate sodium (COLACE) 100 MG capsule Take 1 capsule (100 mg total) by mouth 2 (two) times daily. 60 capsule 0  . metFORMIN (GLUCOPHAGE-XR) 500 MG 24 hr tablet Take 500 mg  by mouth 2 (two) times daily as needed (CBG >150).     [name] Kitchen oxyCODONE (OXY IR/ROXICODONE) 5 MG immediate release tablet Take 1 tablet (5 mg total) by mouth every 3 (three) hours as needed for moderate pain ((score 4 to 6)). (Patient taking differently: Take 2.5-5 mg by mouth every 3 (three) hours as needed for moderate pain ((score 4 to 6)). ) 30 tablet 0  . rosuvastatin (CRESTOR) 5 MG tablet Take 5 mg by mouth daily.    . tamsulosin (FLOMAX) 0.4 MG CAPS capsule Take 0.4 mg by mouth daily after supper.      No facility-administered medications prior to visit.     Past Medical History:   Diagnosis Date  . Cancer ([place])    Lymphona treated with Chemo and radiation  . Coronary artery disease   . Diabetes mellitus without complication ([place])    Type II  . Dyslipidemia   . Gastric ulcer   . Hypertension   . Spinal stenosis   . Spondylolisthesis   . Stroke [facility])    2013ish, left hand slightly weaker than Right     Past Surgical History:  Procedure Laterality Date  . ANKLE SURGERY Right    right ankel fracture- has a plate  . CARDIAC CATHETERIZATION    . CORONARY ANGIOPLASTY     stent 1, unsure of date.  [name] Kitchen NECK EXPLORATION    . partial gastrectomy  1960 ish       Review of Systems  Constitutional: Negative for chills, diaphoresis, fatigue and fever.  Eyes:       Negative for changes in vision  Respiratory: Negative for cough, chest tightness, shortness of breath and wheezing.   Cardiovascular: Negative for chest pain, palpitations and leg swelling.  Gastrointestinal: Negative for abdominal pain, diarrhea, nausea and vomiting.  Neurological: Negative for dizziness, weakness and light-headedness.      Objective:    BP (!) 207/96 Comment: reports elevated BP due to being taken off lisinopril;  Pulse 63   Temp 97.7 F (36.5 C) (Other (Comment))   Wt 165 lb 6.4 oz (75 kg)   BMI 23.73 kg/m  Nursing note and vital signs reviewed.  Physical Exam Constitutional:      General: He is not in acute distress.    Appearance: He is well-developed.     Comments: Seated in the chair; pleasant  Cardiovascular:     Rate and Rhythm: Normal rate and regular rhythm.     Heart sounds: Normal heart sounds.  Pulmonary:     Effort: Pulmonary effort is normal.     Breath sounds: Normal breath sounds.  Skin:    General: Skin is warm and dry.  Neurological:     Mental Status: He is alert and oriented to person, place, and time.  Psychiatric:        Behavior: Behavior normal.        Thought Content: Thought content normal.        Judgment: Judgment normal.      Depression screen PHQ 2/9 07/22/2019  Decreased Interest 0  Down, Depressed, Hopeless 0  PHQ - 2 Score 0       Assessment & Plan:    Patient Active Problem List   Diagnosis Date Noted  . MSSA bacteremia 06/12/2019  . Generalized weakness 06/10/2019  . Essential hypertension 06/10/2019  . Acute hyponatremia 06/09/2019  . Spondylolisthesis, lumbar region 05/20/2019     Problem List Items Addressed This Visit      Cardiovascular  and Mediastinum   Essential hypertension    David Bird has poorly controlled blood pressure with no red flag or warning symptoms including headache or changes in vision.  Recently lisinopril was discontinued and currently on atenolol.  We will start Norvasc while he awaits appointment with new PCP.  If blood pressure continues to remain elevated may need to go to the hospital or if symptoms of headache or blurred vision occur.  Encouraged to monitor blood pressure at home and follow low-sodium diet.      Relevant Medications   amLODipine (NORVASC) 10 MG tablet     Other   MSSA bacteremia - Primary    David Bird is nearing completion of antimicrobial therapy with cefazolin and rifampin for MSSA bacteremia with likely source being his lumbar spine postsurgical wound.  This has healed well with no opening or drainage present.  PICC line appears to be functioning appropriately.  Discussed clinically he is looking very well, however inflammatory markers remain significantly elevated and will await CRP/ESR results from tomorrow's blood work to decide on additional treatment.  May need to consider an additional 2 weeks of IV cefazolin with rifampin if inflammatory markers remain elevated or consider transition to oral medication with cephalexin.  Plan for follow-up in 1 month or sooner if needed pending blood work results.          I am having Clayden C. Thurgood start on amLODipine. I am also having him maintain his atenolol, bethanechol, tamsulosin,  rosuvastatin, vitamin C, oxyCODONE, docusate sodium, metFORMIN, Acetaminophen (TYLENOL PO), ceFAZolin, rifampin, demeclocycline, and ceFAZolin.   Meds ordered this encounter  Medications  . amLODipine (NORVASC) 10 MG tablet    Sig: Take 1 tablet (10 mg total) by mouth daily.    Dispense:  30 tablet    Refill:  2    Order Specific Question:   Supervising Provider    Answer:   Carlyle Basques [[id number]]     Follow-up: Return in about 1 month (around 08/22/2019), or if symptoms worsen or fail to improve.   Terri Piedra, MSN, FNP-C Nurse Practitioner [facility] for Infectious Disease [place] number: [phone]

## 2019-07-22 NOTE — Assessment & Plan Note (Signed)
David Bird is nearing completion of antimicrobial therapy with cefazolin and rifampin for MSSA bacteremia with likely source being his lumbar spine postsurgical wound.  This has healed well with no opening or drainage present.  PICC line appears to be functioning appropriately.  Discussed clinically he is looking very well, however inflammatory markers remain significantly elevated and will await CRP/ESR results from tomorrow's blood work to decide on additional treatment.  May need to consider an additional 2 weeks of IV cefazolin with rifampin if inflammatory markers remain elevated or consider transition to oral medication with cephalexin.  Plan for follow-up in 1 month or sooner if needed pending blood work results.

## 2019-07-22 NOTE — Patient Instructions (Signed)
Nice to see you.  We will continue your antibiotics until Friday until your next blood work results are available.   Continue to administer the Cefazolin.  I have sent a prescription for amlodipine to the pharmacy. Please continue to monitor your blood pressure at home and follow up with PCP.  We will plan for follow up pending blood work results.

## 2019-07-23 DIAGNOSIS — B9561 Methicillin susceptible Staphylococcus aureus infection as the cause of diseases classified elsewhere: Secondary | ICD-10-CM | POA: Diagnosis not present

## 2019-07-23 DIAGNOSIS — T8140XS Infection following a procedure, unspecified, sequela: Secondary | ICD-10-CM | POA: Diagnosis not present

## 2019-07-23 DIAGNOSIS — M4316 Spondylolisthesis, lumbar region: Secondary | ICD-10-CM | POA: Diagnosis not present

## 2019-07-23 DIAGNOSIS — L739 Follicular disorder, unspecified: Secondary | ICD-10-CM | POA: Diagnosis not present

## 2019-07-24 ENCOUNTER — Telehealth: Payer: Self-pay | Admitting: Family

## 2019-07-24 NOTE — Telephone Encounter (Signed)
Spoke with Mr. David Bird regarding plan of care.  Inflammatory markers significantly improved although remain elevated.  We will continue IV antibiotics for 2 weeks with end date of 08/05/2018 and pull PIC following completion of treatment.  Orders called to [facility] and confirmed.

## 2019-07-25 DIAGNOSIS — B9561 Methicillin susceptible Staphylococcus aureus infection as the cause of diseases classified elsewhere: Secondary | ICD-10-CM | POA: Diagnosis not present

## 2019-07-26 DIAGNOSIS — B9561 Methicillin susceptible Staphylococcus aureus infection as the cause of diseases classified elsewhere: Secondary | ICD-10-CM | POA: Diagnosis not present

## 2019-07-27 DIAGNOSIS — B9561 Methicillin susceptible Staphylococcus aureus infection as the cause of diseases classified elsewhere: Secondary | ICD-10-CM | POA: Diagnosis not present

## 2019-07-29 DIAGNOSIS — L739 Follicular disorder, unspecified: Secondary | ICD-10-CM | POA: Diagnosis not present

## 2019-07-29 DIAGNOSIS — B9561 Methicillin susceptible Staphylococcus aureus infection as the cause of diseases classified elsewhere: Secondary | ICD-10-CM | POA: Diagnosis not present

## 2019-07-29 DIAGNOSIS — T8140XS Infection following a procedure, unspecified, sequela: Secondary | ICD-10-CM | POA: Diagnosis not present

## 2019-07-30 DIAGNOSIS — B9561 Methicillin susceptible Staphylococcus aureus infection as the cause of diseases classified elsewhere: Secondary | ICD-10-CM | POA: Diagnosis not present

## 2019-08-07 DIAGNOSIS — B9561 Methicillin susceptible Staphylococcus aureus infection as the cause of diseases classified elsewhere: Secondary | ICD-10-CM | POA: Diagnosis not present

## 2019-08-27 ENCOUNTER — Telehealth: Payer: Self-pay

## 2019-08-27 ENCOUNTER — Other Ambulatory Visit: Payer: Self-pay

## 2019-08-27 NOTE — Patient Outreach (Signed)
[facility]) Care Management  08/27/2019  KEVIS OTTAVIANI 09/20/1935 [id number]   Medication Adherence call to Mr. Brookes [place] Identifiers Verify spoke with patient he is past due on Rosuvastatin 5 mg pt explain he takes 1 tablet daily and has  already pick up from the pharmacy,patient has enough at this time. Mr. Landesman is showing past due under [place].   [place] Management Direct Dial [phone]  Fax [phone] [email or web address]

## 2019-08-27 NOTE — Telephone Encounter (Signed)
Patient called office today stating he is having transportation issues and is unable to come into office for labs. States that he would like to have his PCP do labs if possible. Would also like to know if he could have his follow up appointment done virtually since he is concerned about covid.  Advised patient to follow up with PCP to make sure they would be okay with doing labs. If so we would need contact information for them. Will route message to FNP in regarding to virtual visit. Understand we will need labs faxed to office before appointment.  [place]

## 2019-08-28 ENCOUNTER — Ambulatory Visit: Payer: Medicare Other | Admitting: Family

## 2019-08-29 DIAGNOSIS — I10 Essential (primary) hypertension: Secondary | ICD-10-CM | POA: Diagnosis not present

## 2019-08-29 DIAGNOSIS — E063 Autoimmune thyroiditis: Secondary | ICD-10-CM | POA: Diagnosis not present

## 2019-08-29 DIAGNOSIS — E785 Hyperlipidemia, unspecified: Secondary | ICD-10-CM | POA: Diagnosis not present

## 2019-08-29 DIAGNOSIS — Z79899 Other long term (current) drug therapy: Secondary | ICD-10-CM | POA: Diagnosis not present

## 2019-08-29 DIAGNOSIS — Z9181 History of falling: Secondary | ICD-10-CM | POA: Diagnosis not present

## 2019-08-29 DIAGNOSIS — D649 Anemia, unspecified: Secondary | ICD-10-CM | POA: Diagnosis not present

## 2019-08-29 DIAGNOSIS — R7309 Other abnormal glucose: Secondary | ICD-10-CM | POA: Diagnosis not present

## 2019-09-20 ENCOUNTER — Other Ambulatory Visit: Payer: Self-pay

## 2019-09-20 ENCOUNTER — Encounter: Payer: Self-pay | Admitting: Family

## 2019-09-20 ENCOUNTER — Ambulatory Visit: Payer: Medicare Other | Admitting: Family

## 2019-09-20 DIAGNOSIS — R7881 Bacteremia: Secondary | ICD-10-CM

## 2019-09-20 DIAGNOSIS — B9561 Methicillin susceptible Staphylococcus aureus infection as the cause of diseases classified elsewhere: Secondary | ICD-10-CM

## 2019-09-20 DIAGNOSIS — M4316 Spondylolisthesis, lumbar region: Secondary | ICD-10-CM | POA: Diagnosis not present

## 2019-09-20 DIAGNOSIS — I10 Essential (primary) hypertension: Secondary | ICD-10-CM | POA: Diagnosis not present

## 2019-09-20 NOTE — Patient Instructions (Signed)
Nice to see you.  There is no further need for antibiotics at this time.  Continue to monitor for worsening back pain that is not caused by activity or improved with rest.   Further follow up with ID as needed.   Have a great day and stay safe!

## 2019-09-20 NOTE — Progress Notes (Signed)
Subjective:    Patient ID: David Bird, male    DOB: October 27, 1935, 84 y.o.   MRN: [id number]  Chief Complaint  Patient presents with  . Follow-up     HPI:  David Bird is a 84 y.o. male with previous MSSA bacteremia likely from lumbar spine postsurgical wound status post 8 weeks of cefazolin and rifampin who was last seen in the office on 07/22/2019 near the end of treatment with slightly elevated inflammatory markers.  [name] returns today having been off antibiotics for approximately 5 weeks without complications.  Was recently seen by neurosurgery with no concern for evidence of infection and follow-up in 6 months.  No significant back pain today and he is working on improving his functional status and is excited about getting back to fishing and living independently.  No fevers, chills, or sweats.   Allergies  Allergen Reactions  . Penicillins Rash    Did it involve swelling of the face/tongue/throat, SOB, or low BP? No Did it involve sudden or severe rash/hives, skin peeling, or any reaction on the inside of your mouth or nose? No Did you need to seek medical attention at a hospital or doctor's office? Yes When did it last happen?40 years ago If all above answers are "NO", may proceed with cephalosporin use.       Outpatient Medications Prior to Visit  Medication Sig Dispense Refill  . amLODipine (NORVASC) 10 MG tablet Take 1 tablet (10 mg total) by mouth daily. 30 tablet 2  . atenolol (TENORMIN) 50 MG tablet Take 50 mg by mouth daily.    . tamsulosin (FLOMAX) 0.4 MG CAPS capsule Take 0.4 mg by mouth daily after supper.     . Acetaminophen (TYLENOL PO) Take 2 tablets by mouth 2 (two) times daily as needed (pain).    . Ascorbic Acid (VITAMIN C) 1000 MG tablet Take 1,000 mg by mouth every other day.    . bethanechol (URECHOLINE) 25 MG tablet Take 25 mg by mouth daily.     [name] Kitchen demeclocycline (DECLOMYCIN) 150 MG tablet Take 1 tablet (150 mg total) by  mouth every 12 (twelve) hours. (Patient not taking: Reported on 07/22/2019) 60 tablet 0  . docusate sodium (COLACE) 100 MG capsule Take 1 capsule (100 mg total) by mouth 2 (two) times daily. (Patient not taking: Reported on 09/20/2019) 60 capsule 0  . metFORMIN (GLUCOPHAGE-XR) 500 MG 24 hr tablet Take 500 mg by mouth 2 (two) times daily as needed (CBG >150).     [name] Kitchen oxyCODONE (OXY IR/ROXICODONE) 5 MG immediate release tablet Take 1 tablet (5 mg total) by mouth every 3 (three) hours as needed for moderate pain ((score 4 to 6)). (Patient not taking: Reported on 09/20/2019) 30 tablet 0  . rosuvastatin (CRESTOR) 5 MG tablet Take 5 mg by mouth daily.    [name] Kitchen ceFAZolin (ANCEF) 10 g injection      No facility-administered medications prior to visit.     Past Medical History:  Diagnosis Date  . Cancer ([place])    Lymphona treated with Chemo and radiation  . Coronary artery disease   . Diabetes mellitus without complication ([place])    Type II  . Dyslipidemia   . Gastric ulcer   . Hypertension   . Spinal stenosis   . Spondylolisthesis   . Stroke [facility])    2013ish, left hand slightly weaker than Right     Past Surgical History:  Procedure Laterality Date  . ANKLE SURGERY Right  right ankel fracture- has a plate  . CARDIAC CATHETERIZATION    . CORONARY ANGIOPLASTY     stent 1, unsure of date.  [name] Kitchen NECK EXPLORATION    . partial gastrectomy  1960 ish       Review of Systems    Objective:    BP (!) 149/83   Pulse (!) 57   Temp 98.2 F (36.8 C) (Oral)   Wt 173 lb (78.5 kg)   BMI 24.82 kg/m  Nursing note and vital signs reviewed.  Physical Exam   Depression screen PHQ 2/9 07/22/2019  Decreased Interest 0  Down, Depressed, Hopeless 0  PHQ - 2 Score 0       Assessment & Plan:    Patient Active Problem List   Diagnosis Date Noted  . MSSA bacteremia 06/12/2019  . Generalized weakness 06/10/2019  . Essential hypertension 06/10/2019  . Acute hyponatremia 06/09/2019  .  Spondylolisthesis, lumbar region 05/20/2019     Problem List Items Addressed This Visit      Other   MSSA bacteremia    Mr. Crubaugh has completed necessary antibiotic treatment for MSSA bacteremia likely from lumbar surgical wound without further complications.  No further antibiotic therapy is necessary at this time as his wound is healed and he is returned to baseline.  Discussed importance of notifying providers if worsening back pain recurs, wound opens, or there is concern for infection.  Follow-up with ID clinic as necessary.          I have discontinued Hani C. Arvelo's ceFAZolin. I am also having him maintain his atenolol, bethanechol, tamsulosin, rosuvastatin, vitamin C, oxyCODONE, docusate sodium, metFORMIN, Acetaminophen (TYLENOL PO), demeclocycline, and amLODipine.   No orders of the defined types were placed in this encounter.    Follow-up: Return if symptoms worsen or fail to improve.   Terri Piedra, MSN, FNP-C Nurse Practitioner [facility] [place] number: [phone]

## 2019-09-20 NOTE — Assessment & Plan Note (Signed)
Mr. Suire has completed necessary antibiotic treatment for MSSA bacteremia likely from lumbar surgical wound without further complications.  No further antibiotic therapy is necessary at this time as his wound is healed and he is returned to baseline.  Discussed importance of notifying providers if worsening back pain recurs, wound opens, or there is concern for infection.  Follow-up with ID clinic as necessary.

## 2019-10-07 DIAGNOSIS — R339 Retention of urine, unspecified: Secondary | ICD-10-CM | POA: Diagnosis not present

## 2019-11-18 DIAGNOSIS — R7309 Other abnormal glucose: Secondary | ICD-10-CM | POA: Diagnosis not present

## 2019-11-18 DIAGNOSIS — R42 Dizziness and giddiness: Secondary | ICD-10-CM | POA: Diagnosis not present

## 2019-11-18 DIAGNOSIS — I10 Essential (primary) hypertension: Secondary | ICD-10-CM | POA: Diagnosis not present

## 2019-11-18 DIAGNOSIS — D649 Anemia, unspecified: Secondary | ICD-10-CM | POA: Diagnosis not present

## 2019-11-18 DIAGNOSIS — E785 Hyperlipidemia, unspecified: Secondary | ICD-10-CM | POA: Diagnosis not present

## 2020-01-14 DIAGNOSIS — E785 Hyperlipidemia, unspecified: Secondary | ICD-10-CM | POA: Diagnosis not present

## 2020-01-14 DIAGNOSIS — R7309 Other abnormal glucose: Secondary | ICD-10-CM | POA: Diagnosis not present

## 2020-01-14 DIAGNOSIS — I10 Essential (primary) hypertension: Secondary | ICD-10-CM | POA: Diagnosis not present

## 2020-01-14 DIAGNOSIS — E559 Vitamin D deficiency, unspecified: Secondary | ICD-10-CM | POA: Diagnosis not present

## 2020-01-21 DIAGNOSIS — I739 Peripheral vascular disease, unspecified: Secondary | ICD-10-CM | POA: Diagnosis not present

## 2020-03-18 DIAGNOSIS — I10 Essential (primary) hypertension: Secondary | ICD-10-CM | POA: Diagnosis not present

## 2020-03-18 DIAGNOSIS — I6523 Occlusion and stenosis of bilateral carotid arteries: Secondary | ICD-10-CM | POA: Diagnosis not present

## 2020-03-18 DIAGNOSIS — Z9861 Coronary angioplasty status: Secondary | ICD-10-CM | POA: Diagnosis not present

## 2020-03-18 DIAGNOSIS — E785 Hyperlipidemia, unspecified: Secondary | ICD-10-CM | POA: Diagnosis not present

## 2020-03-18 DIAGNOSIS — I2510 Atherosclerotic heart disease of native coronary artery without angina pectoris: Secondary | ICD-10-CM | POA: Diagnosis not present

## 2020-05-29 DIAGNOSIS — J069 Acute upper respiratory infection, unspecified: Secondary | ICD-10-CM | POA: Diagnosis not present

## 2020-05-29 DIAGNOSIS — J309 Allergic rhinitis, unspecified: Secondary | ICD-10-CM | POA: Diagnosis not present

## 2020-06-18 IMAGING — RF DG LUMBAR SPINE 2-3V
1 series · 2 of 2 positions shown · IV contrast (agent unspecified)
Comparison: MRI May 13, 2019.

CLINICAL DATA: Surgical posterior fusion of L3-4.

EXAM:
DG C-ARM 1-60 MIN; LUMBAR SPINE - 2-3 VIEW
CONTRAST:  None.
FLUOROSCOPY TIME:  Fluoroscopy Time:  13 seconds.
Number of Acquired Spot Images: 2.

[Series 1: run · 2 of 2 slices shown]
[im 1/2]
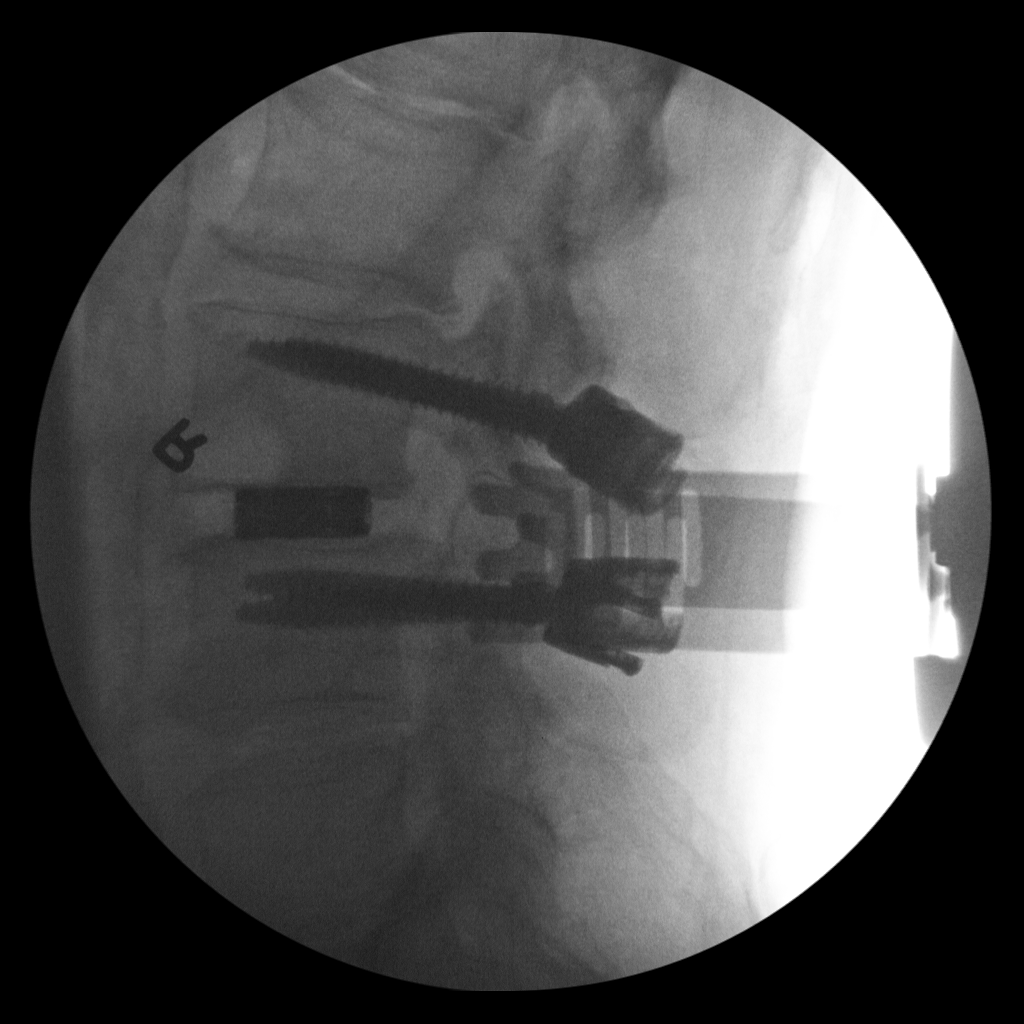
[im 2/2]
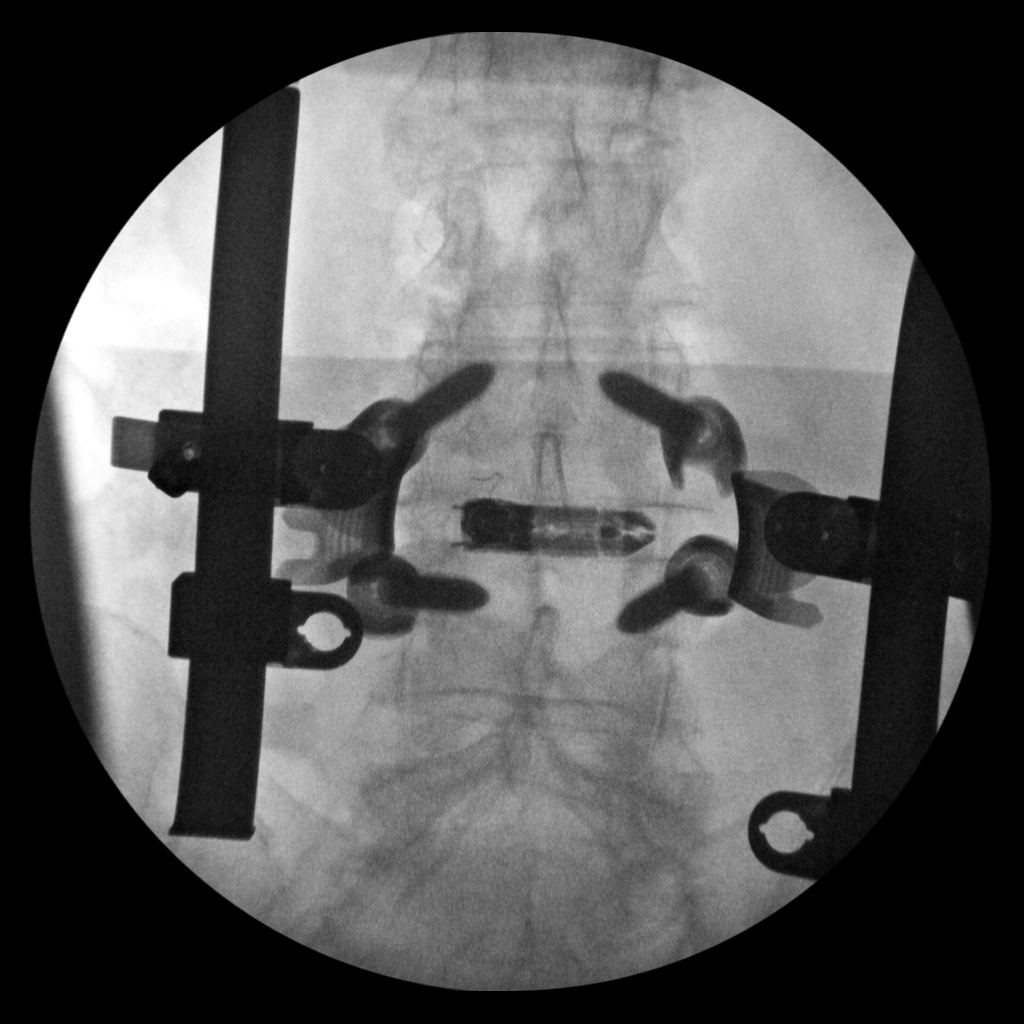

[2 of 2 positions shown; findings below may reference images not displayed]

FINDINGS: Two intraoperative fluoroscopic images were obtained of the lumbar
spine. These images demonstrate the patient to be status post
surgical posterior fusion of L3-4 with bilateral intrapedicular
screw placement and interbody fusion. Good alignment of vertebral
bodies is noted.
IMPRESSION: Fluoroscopic guidance provided during surgical posterior fusion of
L3-4.

## 2020-07-01 DIAGNOSIS — G9519 Other vascular myelopathies: Secondary | ICD-10-CM | POA: Diagnosis not present

## 2020-07-01 DIAGNOSIS — I6990 Unspecified sequelae of unspecified cerebrovascular disease: Secondary | ICD-10-CM | POA: Diagnosis not present

## 2020-07-01 DIAGNOSIS — I739 Peripheral vascular disease, unspecified: Secondary | ICD-10-CM | POA: Diagnosis not present

## 2020-07-08 DIAGNOSIS — I739 Peripheral vascular disease, unspecified: Secondary | ICD-10-CM | POA: Diagnosis not present

## 2020-08-28 DIAGNOSIS — M1611 Unilateral primary osteoarthritis, right hip: Secondary | ICD-10-CM | POA: Diagnosis not present

## 2020-08-28 DIAGNOSIS — M48061 Spinal stenosis, lumbar region without neurogenic claudication: Secondary | ICD-10-CM | POA: Diagnosis not present

## 2020-09-05 DIAGNOSIS — R030 Elevated blood-pressure reading, without diagnosis of hypertension: Secondary | ICD-10-CM | POA: Diagnosis not present

## 2020-09-05 DIAGNOSIS — I10 Essential (primary) hypertension: Secondary | ICD-10-CM | POA: Diagnosis not present

## 2020-09-10 DIAGNOSIS — H5213 Myopia, bilateral: Secondary | ICD-10-CM | POA: Diagnosis not present

## 2020-09-10 DIAGNOSIS — H524 Presbyopia: Secondary | ICD-10-CM | POA: Diagnosis not present

## 2020-09-10 DIAGNOSIS — H2513 Age-related nuclear cataract, bilateral: Secondary | ICD-10-CM | POA: Diagnosis not present

## 2020-09-10 DIAGNOSIS — H52223 Regular astigmatism, bilateral: Secondary | ICD-10-CM | POA: Diagnosis not present

## 2020-10-06 DIAGNOSIS — M25551 Pain in right hip: Secondary | ICD-10-CM | POA: Diagnosis not present

## 2020-12-24 DIAGNOSIS — Z139 Encounter for screening, unspecified: Secondary | ICD-10-CM | POA: Diagnosis not present

## 2020-12-24 DIAGNOSIS — I779 Disorder of arteries and arterioles, unspecified: Secondary | ICD-10-CM | POA: Diagnosis not present

## 2020-12-24 DIAGNOSIS — Z9181 History of falling: Secondary | ICD-10-CM | POA: Diagnosis not present

## 2020-12-24 DIAGNOSIS — E063 Autoimmune thyroiditis: Secondary | ICD-10-CM | POA: Diagnosis not present

## 2020-12-24 DIAGNOSIS — R7309 Other abnormal glucose: Secondary | ICD-10-CM | POA: Diagnosis not present

## 2020-12-24 DIAGNOSIS — E785 Hyperlipidemia, unspecified: Secondary | ICD-10-CM | POA: Diagnosis not present

## 2020-12-24 DIAGNOSIS — I10 Essential (primary) hypertension: Secondary | ICD-10-CM | POA: Diagnosis not present

## 2020-12-24 DIAGNOSIS — I2510 Atherosclerotic heart disease of native coronary artery without angina pectoris: Secondary | ICD-10-CM | POA: Diagnosis not present

## 2021-01-11 DIAGNOSIS — I10 Essential (primary) hypertension: Secondary | ICD-10-CM | POA: Diagnosis not present

## 2021-01-11 DIAGNOSIS — E119 Type 2 diabetes mellitus without complications: Secondary | ICD-10-CM | POA: Diagnosis not present

## 2021-01-11 DIAGNOSIS — I779 Disorder of arteries and arterioles, unspecified: Secondary | ICD-10-CM | POA: Diagnosis not present

## 2021-01-11 DIAGNOSIS — I2510 Atherosclerotic heart disease of native coronary artery without angina pectoris: Secondary | ICD-10-CM | POA: Diagnosis not present

## 2021-01-13 DIAGNOSIS — M25551 Pain in right hip: Secondary | ICD-10-CM | POA: Diagnosis not present

## 2021-01-27 ENCOUNTER — Other Ambulatory Visit: Payer: Self-pay | Admitting: *Deleted

## 2021-01-27 DIAGNOSIS — M25569 Pain in unspecified knee: Secondary | ICD-10-CM

## 2021-02-04 ENCOUNTER — Inpatient Hospital Stay (HOSPITAL_COMMUNITY): Admission: RE | Admit: 2021-02-04 | Payer: Medicare Other | Source: Ambulatory Visit

## 2021-02-04 ENCOUNTER — Encounter: Payer: Medicare Other | Admitting: Vascular Surgery

## 2021-03-01 DIAGNOSIS — H6591 Unspecified nonsuppurative otitis media, right ear: Secondary | ICD-10-CM | POA: Diagnosis not present

## 2021-03-15 ENCOUNTER — Inpatient Hospital Stay (HOSPITAL_COMMUNITY): Admission: RE | Admit: 2021-03-15 | Payer: Medicare Other | Source: Ambulatory Visit

## 2021-03-15 ENCOUNTER — Encounter: Payer: Medicare Other | Admitting: Surgery

## 2021-03-17 DIAGNOSIS — H6691 Otitis media, unspecified, right ear: Secondary | ICD-10-CM | POA: Diagnosis not present

## 2021-03-25 ENCOUNTER — Other Ambulatory Visit: Payer: Self-pay

## 2021-03-25 DIAGNOSIS — I739 Peripheral vascular disease, unspecified: Secondary | ICD-10-CM

## 2021-03-30 ENCOUNTER — Ambulatory Visit (HOSPITAL_COMMUNITY)
Admission: RE | Admit: 2021-03-30 | Discharge: 2021-03-30 | Disposition: A | Discharge status: Home / Self Care | Payer: Medicare Other | Source: Ambulatory Visit | Attending: Vascular Surgery | Admitting: Vascular Surgery

## 2021-03-30 ENCOUNTER — Other Ambulatory Visit: Payer: Self-pay

## 2021-03-30 ENCOUNTER — Ambulatory Visit: Payer: Medicare Other | Admitting: Vascular Surgery

## 2021-03-30 ENCOUNTER — Encounter: Payer: Self-pay | Admitting: Vascular Surgery

## 2021-03-30 DIAGNOSIS — I739 Peripheral vascular disease, unspecified: Secondary | ICD-10-CM

## 2021-03-30 NOTE — Progress Notes (Signed)
Patient name: David Bird MRN: [id number] DOB: 24-Jul-1936 Sex: male  REASON FOR CONSULT: Evaluate PAD  HPI: David Bird is a 85 y.o. male, with history of lymphoma status post chemoradiation, coronary artery disease, diabetes, hypertension, dyslipidemia that presents for evaluation of peripheral arterial disease.  He describes about 3 years of pain in bilateral calves when he walks.  States this has become more disabling.  He can only walk about 100 yards.  States both legs are equally disabled.  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  He has had no previous interventions.  He has had a coronary stent in the past.  States he does not smoke at this time but has smoked in the past.  Past Medical History:  Diagnosis Date   Cancer ([place])    Lymphona treated with Chemo and radiation   Coronary artery disease    Diabetes mellitus without complication ([place])    Type II   Dyslipidemia    Gastric ulcer    Hypertension    Spinal stenosis    Spondylolisthesis    Stroke ([place])    2013ish, left hand slightly weaker than Right    Past Surgical History:  Procedure Laterality Date   ANKLE SURGERY Right    right ankel fracture- has a plate   CARDIAC CATHETERIZATION     CORONARY ANGIOPLASTY     stent 1, unsure of date.   NECK EXPLORATION     partial gastrectomy  1960 ish    History reviewed. No pertinent family history.  SOCIAL HISTORY: Social History   Socioeconomic History   Marital status: Married    Spouse name: Not on file   Number of children: Not on file   Years of education: Not on file   Highest education level: Not on file  Occupational History   Not on file  Tobacco Use   Smoking status: Former    Years: 20.00    Types: Cigarettes    Quit date: 1970    Years since quitting: 52.7   Smokeless tobacco: Former    Quit date: 1980  Scientific laboratory technician Use: Never used  Substance and Sexual Activity    Alcohol use: Never   Drug use: Never   Sexual activity: Not on file  Other Topics Concern   Not on file  Social History Narrative   Not on file   Social Determinants of Health   Financial Resource Strain: Not on file  Food Insecurity: Not on file  Transportation Needs: Not on file  Physical Activity: Not on file  Stress: Not on file  Social Connections: Not on file  Intimate Partner Violence: Not on file    Allergies  Allergen Reactions   Penicillins Rash and Hives    Did it involve swelling of the face/tongue/throat, SOB, or low BP? No Did it involve sudden or severe rash/hives, skin peeling, or any reaction on the inside of your mouth or nose? No Did you need to seek medical attention at a hospital or doctor's office? Yes When did it last happen?      40 years ago If all above answers are "NO", may proceed with cephalosporin use.  Did it involve swelling of the face/tongue/throat, SOB, or low BP? No Did it involve sudden or severe rash/hives, skin peeling, or any reaction on the inside of your mouth or nose? No Did you need to seek medical  attention at a hospital or doctor's office? Yes When did it last happen?      40 years ago If all above answers are "NO", may proceed with cephalosporin use.    Current Outpatient Medications  Medication Sig Dispense Refill   Acetaminophen (TYLENOL PO) Take 2 tablets by mouth 2 (two) times daily as needed (pain).     amLODipine (NORVASC) 10 MG tablet Take 1 tablet (10 mg total) by mouth daily. 30 tablet 2   Ascorbic Acid (VITAMIN C) 1000 MG tablet Take 1,000 mg by mouth every other day.     atenolol (TENORMIN) 50 MG tablet Take 50 mg by mouth daily.     bethanechol (URECHOLINE) 25 MG tablet Take 25 mg by mouth daily.      metFORMIN (GLUCOPHAGE-XR) 500 MG 24 hr tablet Take 500 mg by mouth 2 (two) times daily as needed (CBG >150).      rosuvastatin (CRESTOR) 5 MG tablet Take 5 mg by mouth daily.     tamsulosin (FLOMAX) 0.4 MG CAPS  capsule Take 0.4 mg by mouth daily after supper.      demeclocycline (DECLOMYCIN) 150 MG tablet Take 1 tablet (150 mg total) by mouth every 12 (twelve) hours. (Patient not taking: No sig reported) 60 tablet 0   docusate sodium (COLACE) 100 MG capsule Take 1 capsule (100 mg total) by mouth 2 (two) times daily. (Patient not taking: No sig reported) 60 capsule 0   oxyCODONE (OXY IR/ROXICODONE) 5 MG immediate release tablet Take 1 tablet (5 mg total) by mouth every 3 (three) hours as needed for moderate pain ((score 4 to 6)). (Patient not taking: No sig reported) 30 tablet 0   No current facility-administered medications for this visit.    REVIEW OF SYSTEMS:  '[X]'$  denotes positive finding, '[ ]'$  denotes negative finding Cardiac  Comments:  Chest pain or chest pressure:    Shortness of breath upon exertion:    Short of breath when lying flat:    Irregular heart rhythm:        Vascular    Pain in calf, thigh, or hip brought on by ambulation: x bilateral  Pain in feet at night that wakes you up from your sleep:     Blood clot in your veins:    Leg swelling:         Pulmonary    Oxygen at home:    Productive cough:     Wheezing:         Neurologic    Sudden weakness in arms or legs:     Sudden numbness in arms or legs:     Sudden onset of difficulty speaking or slurred speech:    Temporary loss of vision in one eye:     Problems with dizziness:         Gastrointestinal    Blood in stool:     Vomited blood:         Genitourinary    Burning when urinating:     Blood in urine:        Psychiatric    Major depression:         Hematologic    Bleeding problems:    Problems with blood clotting too easily:        Skin    Rashes or ulcers:        Constitutional    Fever or chills:      PHYSICAL EXAM: Vitals:   03/30/21 1532  BP: (!) 166/88  Pulse: 62  Resp: 16  Temp: 97.7 F (36.5 C)  TempSrc: Temporal  SpO2: 95%  Weight: 180 lb (81.6 kg)  Height: '5\' 10"'$  (1.778 m)     GENERAL: The patient is a well-nourished male, in no acute distress. The vital signs are documented above. CARDIAC: There is a regular rate and rhythm.  VASCULAR:  No palpable femoral pulses bilaterally No palpable pedal pulses PULMONARY: There is good air exchange bilaterally without wheezing or rales. ABDOMEN: Soft and non-tender. MUSCULOSKELETAL: There are no major deformities or cyanosis. NEUROLOGIC: No focal weakness or paresthesias are detected. SKIN: There are no ulcers or rashes noted. PSYCHIATRIC: The patient has a normal affect.  DATA:   ABIs today are 0.69 on the right and 0.61 on the left  Assessment/Plan:  85 year old male presents for evaluation of PAD.  His history sounds consistent with bilateral lower extremity calf claudication.  I discussed typically managing claudication conservatively with exercise and walking therapies as well as medications like Pletal.  Ultimately he thinks this has become very disabling and he is going to have to quit doing all the things he enjoys like hunting with his dogs.  I cannot feel any femoral pulses on exam, so I suspect he has significant aortoiliac inflow disease.  I discussed all this with him today.  I will send him for a CTA abdomen pelvis with runoff and then follow-up with me afterwards.  At that time we will review CTA and options moving forward.   Marty Heck, MD [facility] Office: [phone]

## 2021-04-01 ENCOUNTER — Other Ambulatory Visit: Payer: Self-pay | Admitting: *Deleted

## 2021-04-01 DIAGNOSIS — I739 Peripheral vascular disease, unspecified: Secondary | ICD-10-CM

## 2021-04-12 ENCOUNTER — Ambulatory Visit
Admission: RE | Admit: 2021-04-12 | Discharge: 2021-04-12 | Disposition: A | Discharge status: Home / Self Care | Payer: Medicare Other | Source: Ambulatory Visit | Attending: Vascular Surgery | Admitting: Vascular Surgery

## 2021-04-12 DIAGNOSIS — I745 Embolism and thrombosis of iliac artery: Secondary | ICD-10-CM | POA: Diagnosis not present

## 2021-04-12 DIAGNOSIS — M5137 Other intervertebral disc degeneration, lumbosacral region: Secondary | ICD-10-CM | POA: Diagnosis not present

## 2021-04-12 DIAGNOSIS — K429 Umbilical hernia without obstruction or gangrene: Secondary | ICD-10-CM | POA: Diagnosis not present

## 2021-04-12 DIAGNOSIS — M5136 Other intervertebral disc degeneration, lumbar region: Secondary | ICD-10-CM | POA: Diagnosis not present

## 2021-04-12 DIAGNOSIS — I739 Peripheral vascular disease, unspecified: Secondary | ICD-10-CM

## 2021-04-12 MED ORDER — IOPAMIDOL (ISOVUE-370) INJECTION 76%
100.0000 mL | Freq: Once | INTRAVENOUS | Status: AC | PRN
  Administered 2021-04-12: 100 mL via INTRAVENOUS

## 2021-04-20 ENCOUNTER — Ambulatory Visit (INDEPENDENT_AMBULATORY_CARE_PROVIDER_SITE_OTHER): Payer: Medicare Other | Admitting: Vascular Surgery

## 2021-04-20 ENCOUNTER — Ambulatory Visit: Payer: Medicare Other | Admitting: Vascular Surgery

## 2021-04-20 ENCOUNTER — Other Ambulatory Visit: Payer: Self-pay

## 2021-04-20 DIAGNOSIS — I700 Atherosclerosis of aorta: Secondary | ICD-10-CM

## 2021-04-20 NOTE — Progress Notes (Signed)
Virtual Visit via Telephone Note    I connected with David Bird on 04/20/2021 using the [email or web address] by telephone and verified that I was speaking with the correct person using two identifiers. Patient was located in [place] and I am located at [facility] office.   The limitations of evaluation and management by telemedicine and the availability of in person appointments have been previously discussed with the patient and are documented in the patients chart. The patient expressed understanding and consented to proceed.  PCP: Ocie Doyne., MD  Chief Complaint: Discuss CTA results for bilateral lower extremity claudication  History of Present Illness: David Bird is a 85 y.o. male with history of lymphoma status post chemoradiation, coronary artery disease, diabetes, hypertension, dyslipidemia that presented for evaluation of peripheral arterial disease.  He described about 3 years of pain in bilateral calves when he walks.  States this has become more disabling.  He can only walk about 100 yards.  I sent him for CTA since I could not feel femoral pulses and he presents today to discuss results.  He is currently in [place].  He was scheduled to see me today but came in yesterday thinking his appointment was yesterday.  Past Medical History:  Diagnosis Date   Cancer ([employer])    Lymphona treated with Chemo and radiation   Coronary artery disease    Diabetes mellitus without complication ([place])    Type II   Dyslipidemia    Gastric ulcer    Hypertension    Spinal stenosis    Spondylolisthesis    Stroke ([place])    2013ish, left hand slightly weaker than Right    Past Surgical History:  Procedure Laterality Date   ANKLE SURGERY Right    right ankel fracture- has a plate   CARDIAC CATHETERIZATION     CORONARY ANGIOPLASTY     stent 1, unsure of date.   NECK EXPLORATION     partial gastrectomy  1960 ish    Current Meds  Medication Sig   Acetaminophen (TYLENOL PO) Take 2 tablets  by mouth 2 (two) times daily as needed (pain).   amLODipine (NORVASC) 10 MG tablet Take 1 tablet (10 mg total) by mouth daily.   Ascorbic Acid (VITAMIN C) 1000 MG tablet Take 1,000 mg by mouth every other day.   atenolol (TENORMIN) 50 MG tablet Take 50 mg by mouth daily.   bethanechol (URECHOLINE) 25 MG tablet Take 25 mg by mouth daily.    docusate sodium (COLACE) 100 MG capsule Take 1 capsule (100 mg total) by mouth 2 (two) times daily.   metFORMIN (GLUCOPHAGE-XR) 500 MG 24 hr tablet Take 500 mg by mouth 2 (two) times daily as needed (CBG >150).    oxyCODONE (OXY IR/ROXICODONE) 5 MG immediate release tablet Take 1 tablet (5 mg total) by mouth every 3 (three) hours as needed for moderate pain ((score 4 to 6)).   rosuvastatin (CRESTOR) 5 MG tablet Take 5 mg by mouth daily.   tamsulosin (FLOMAX) 0.4 MG CAPS capsule Take 0.4 mg by mouth daily after supper.     12 system ROS was negative unless otherwise noted in HPI   Observations/Objective:  CTA AO+BIFEM 04/12/21 reviewed and shows occlusion of the distal abdominal aorta and bilateral common iliac arteries.  Assessment and Plan:  85 year old male with lifestyle limiting short distance claudication of the bilateral lower extremities.  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  Discussed this explains his lower extremity disabling claudication.  Discussed the traditional treatment would be aortobifemoral bypass but at his age of 84 he feels he would not be interested in extensive abdominal surgery with high cardiac risk and prolonged hospitalization.  I discussed another option would be endovascular repair of the distal abdominal aorta and bilateral iliacs occlusion from transfemoral approach.  Ultimately he would like to come in and talk to me about his options in the office.  I will schedule follow-up with me in 2 to 3 weeks.  Follow Up Instructions:   Follow up: 2 to 3  weeks   I discussed the assessment and treatment plan with the patient. The patient was provided an opportunity to ask questions and all were answered. The patient agreed with the plan and demonstrated an understanding of the instructions.   The patient was advised to call back or seek an in-person evaluation if the symptoms worsen or if the condition fails to improve as anticipated.  I spent 5 minutes with the patient via telephone encounter.   Signed, Marty Heck [facility] Office: [phone]  04/20/2021, 2:31 PM

## 2021-05-04 ENCOUNTER — Ambulatory Visit: Payer: Medicare Other | Admitting: Vascular Surgery

## 2021-05-25 ENCOUNTER — Ambulatory Visit: Payer: Medicare Other | Admitting: Vascular Surgery

## 2021-05-25 ENCOUNTER — Other Ambulatory Visit: Payer: Self-pay

## 2021-05-25 ENCOUNTER — Encounter: Payer: Self-pay | Admitting: Vascular Surgery

## 2021-05-25 VITALS — BP 178/85 | HR 55 | Temp 98.7°F | Wt 178.4 lb

## 2021-05-25 DIAGNOSIS — I700 Atherosclerosis of aorta: Secondary | ICD-10-CM

## 2021-05-25 MED ORDER — CILOSTAZOL 100 MG PO TABS: 100.0000 mg | ORAL_TABLET | Freq: Two times a day (BID) | ORAL | 11 refills | Status: DC

## 2021-05-25 NOTE — Progress Notes (Signed)
Patient name: David Bird MRN: [id number] DOB: [birth year] Sex: male  REASON FOR CONSULT: Follow-up after CT scan for evaluation of bilateral lower extremity claudication  HPI: David Bird is a 85 y.o. male, with history of lymphoma status post chemoradiation, coronary artery disease, diabetes, hypertension, dyslipidemia that presents for follow-up after CTA for further evaluation of his bilateral lower extremity claudication.  He was initially seen describing 3 years of bilateral calf pain when walking.  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  I sent him for CTA given we could not feel femoral pulses.  Today he states that he went squirrel hunting over the weekend and was able to go to 5 different trees with his dogs.  He thinks he can at times go 200 to 300 yards without stopping.  Past Medical History:  Diagnosis Date   Cancer ([place])    Lymphona treated with Chemo and radiation   Coronary artery disease    Diabetes mellitus without complication ([place])    Type II   Dyslipidemia    Gastric ulcer    Hypertension    Spinal stenosis    Spondylolisthesis    Stroke ([place])    2013ish, left hand slightly weaker than Right    Past Surgical History:  Procedure Laterality Date   ANKLE SURGERY Right    right ankel fracture- has a plate   CARDIAC CATHETERIZATION     CORONARY ANGIOPLASTY     stent 1, unsure of date.   NECK EXPLORATION     partial gastrectomy  1960 ish    No family history on file.  SOCIAL HISTORY: Social History   Socioeconomic History   Marital status: Married    Spouse name: Not on file   Number of children: Not on file   Years of education: Not on file   Highest education level: Not on file  Occupational History   Not on file  Tobacco Use   Smoking status: Former    Years: 20.00    Types: Cigarettes    Quit date: 1970    Years since quitting: 52.8   Smokeless tobacco: Former    Quit  date: 1980  Scientific laboratory technician Use: Never used  Substance and Sexual Activity   Alcohol use: Never   Drug use: Never   Sexual activity: Not on file  Other Topics Concern   Not on file  Social History Narrative   Not on file   Social Determinants of Health   Financial Resource Strain: Not on file  Food Insecurity: Not on file  Transportation Needs: Not on file  Physical Activity: Not on file  Stress: Not on file  Social Connections: Not on file  Intimate Partner Violence: Not on file    Allergies  Allergen Reactions   Penicillins Rash and Hives    Did it involve swelling of the face/tongue/throat, SOB, or low BP? No Did it involve sudden or severe rash/hives, skin peeling, or any reaction on the inside of your mouth or nose? No Did you need to seek medical attention at a hospital or doctor's office? Yes When did it last happen?      40 years ago If all above answers are "NO", may proceed with cephalosporin use.  Did it involve swelling of the face/tongue/throat, SOB, or low BP? No Did it involve sudden or severe rash/hives, skin peeling, or any reaction on the  inside of your mouth or nose? No Did you need to seek medical attention at a hospital or doctor's office? Yes When did it last happen?      40 years ago If all above answers are "NO", may proceed with cephalosporin use.    Current Outpatient Medications  Medication Sig Dispense Refill   Acetaminophen (TYLENOL PO) Take 2 tablets by mouth 2 (two) times daily as needed (pain).     amLODipine (NORVASC) 10 MG tablet Take 1 tablet (10 mg total) by mouth daily. 30 tablet 2   Ascorbic Acid (VITAMIN C) 1000 MG tablet Take 1,000 mg by mouth every other day.     atenolol (TENORMIN) 50 MG tablet Take 50 mg by mouth daily.     bethanechol (URECHOLINE) 25 MG tablet Take 25 mg by mouth daily.      cilostazol (PLETAL) 100 MG tablet Take 1 tablet (100 mg total) by mouth 2 (two) times daily before a meal. 60 tablet 11    demeclocycline (DECLOMYCIN) 150 MG tablet Take 1 tablet (150 mg total) by mouth every 12 (twelve) hours. 60 tablet 0   docusate sodium (COLACE) 100 MG capsule Take 1 capsule (100 mg total) by mouth 2 (two) times daily. 60 capsule 0   metFORMIN (GLUCOPHAGE-XR) 500 MG 24 hr tablet Take 500 mg by mouth 2 (two) times daily as needed (CBG >150).      oxyCODONE (OXY IR/ROXICODONE) 5 MG immediate release tablet Take 1 tablet (5 mg total) by mouth every 3 (three) hours as needed for moderate pain ((score 4 to 6)). 30 tablet 0   rosuvastatin (CRESTOR) 5 MG tablet Take 5 mg by mouth daily.     tamsulosin (FLOMAX) 0.4 MG CAPS capsule Take 0.4 mg by mouth daily after supper.      No current facility-administered medications for this visit.    REVIEW OF SYSTEMS:  [X]  denotes positive finding, [ ]  denotes negative finding Cardiac  Comments:  Chest pain or chest pressure:    Shortness of breath upon exertion:    Short of breath when lying flat:    Irregular heart rhythm:        Vascular    Pain in calf, thigh, or hip brought on by ambulation: x bilateral  Pain in feet at night that wakes you up from your sleep:     Blood clot in your veins:    Leg swelling:         Pulmonary    Oxygen at home:    Productive cough:     Wheezing:         Neurologic    Sudden weakness in arms or legs:     Sudden numbness in arms or legs:     Sudden onset of difficulty speaking or slurred speech:    Temporary loss of vision in one eye:     Problems with dizziness:         Gastrointestinal    Blood in stool:     Vomited blood:         Genitourinary    Burning when urinating:     Blood in urine:        Psychiatric    Major depression:         Hematologic    Bleeding problems:    Problems with blood clotting too easily:        Skin    Rashes or ulcers:        Constitutional  Fever or chills:      PHYSICAL EXAM: Vitals:   05/25/21 1008  BP: (!) 178/85  Pulse: (!) 55  Temp: 98.7 F (37.1 C)   TempSrc: Skin  SpO2: 96%  Weight: 178 lb 6.4 oz (80.9 kg)    GENERAL: The patient is a well-nourished male, in no acute distress. The vital signs are documented above. CARDIAC: There is a regular rate and rhythm.  VASCULAR:  No palpable femoral pulses bilaterally. No palpable pedal pulses. ABDOMEN: Soft and non-tender. MUSCULOSKELETAL: There are no major deformities or cyanosis. NEUROLOGIC: No focal weakness or paresthesias are detected. SKIN: There are no ulcers or rashes noted. PSYCHIATRIC: The patient has a normal affect.  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  ABIs previously were 0.69 on the right and 0.61 on the left  Assessment/Plan:  85 year old male presents for follow-up after CTA for further evaluation of his bilateral lower extremity claudication.  I reviewed the CT scan with him and discussed he has a distal abdominal aortic occlusion as well as bilateral common iliac artery occlusion.  Discussed that the most durable option for him would be aortobifemoral bypass.  He is not interested in open abdominal surgery at 85 years of age which is understandable.  I offered an alternative option of bilateral transfemoral access to try and cross his iliac and distal aortic occlusion retrograde with stenting of his abdominal aorta and bilateral common iliac arteries.  Ultimately we had a long discussion about there would be some chance this is not successful if we cannot cross the occlusions retrograde.  Ultimately he wants to continue conservative management and delay any surgical intervention.  He did ask about medications.  I did discuss the option of Pletal although I am not sure how well this is going to help him given the complexity of his blockage.  We will certainly try to see if the Pletal helps his walking distances.  I will see him again in 3 months with  ABIs.   Marty Heck, MD [facility] Office: [phone]

## 2021-05-26 ENCOUNTER — Other Ambulatory Visit: Payer: Self-pay

## 2021-05-26 DIAGNOSIS — I739 Peripheral vascular disease, unspecified: Secondary | ICD-10-CM

## 2021-06-29 DIAGNOSIS — B9789 Other viral agents as the cause of diseases classified elsewhere: Secondary | ICD-10-CM | POA: Diagnosis not present

## 2021-06-29 DIAGNOSIS — M791 Myalgia, unspecified site: Secondary | ICD-10-CM | POA: Diagnosis not present

## 2021-06-29 DIAGNOSIS — R051 Acute cough: Secondary | ICD-10-CM | POA: Diagnosis not present

## 2021-06-29 DIAGNOSIS — R0981 Nasal congestion: Secondary | ICD-10-CM | POA: Diagnosis not present

## 2021-08-31 ENCOUNTER — Ambulatory Visit: Payer: Medicare Other | Admitting: Vascular Surgery

## 2021-08-31 ENCOUNTER — Encounter (HOSPITAL_COMMUNITY): Payer: Medicare Other

## 2021-10-12 DIAGNOSIS — I6523 Occlusion and stenosis of bilateral carotid arteries: Secondary | ICD-10-CM | POA: Diagnosis not present

## 2021-10-12 DIAGNOSIS — E1169 Type 2 diabetes mellitus with other specified complication: Secondary | ICD-10-CM | POA: Diagnosis not present

## 2021-10-12 DIAGNOSIS — E782 Mixed hyperlipidemia: Secondary | ICD-10-CM | POA: Diagnosis not present

## 2021-10-12 DIAGNOSIS — Z9861 Coronary angioplasty status: Secondary | ICD-10-CM | POA: Diagnosis not present

## 2021-10-12 DIAGNOSIS — Z789 Other specified health status: Secondary | ICD-10-CM | POA: Diagnosis not present

## 2021-10-12 DIAGNOSIS — I119 Hypertensive heart disease without heart failure: Secondary | ICD-10-CM | POA: Diagnosis not present

## 2021-10-12 DIAGNOSIS — I2510 Atherosclerotic heart disease of native coronary artery without angina pectoris: Secondary | ICD-10-CM | POA: Diagnosis not present

## 2021-10-12 DIAGNOSIS — E034 Atrophy of thyroid (acquired): Secondary | ICD-10-CM | POA: Diagnosis not present

## 2022-01-13 DIAGNOSIS — E1169 Type 2 diabetes mellitus with other specified complication: Secondary | ICD-10-CM | POA: Diagnosis not present

## 2022-01-13 DIAGNOSIS — I6523 Occlusion and stenosis of bilateral carotid arteries: Secondary | ICD-10-CM | POA: Diagnosis not present

## 2022-01-13 DIAGNOSIS — I119 Hypertensive heart disease without heart failure: Secondary | ICD-10-CM | POA: Diagnosis not present

## 2022-01-13 DIAGNOSIS — E782 Mixed hyperlipidemia: Secondary | ICD-10-CM | POA: Diagnosis not present

## 2022-01-13 DIAGNOSIS — Z9861 Coronary angioplasty status: Secondary | ICD-10-CM | POA: Diagnosis not present

## 2022-01-13 DIAGNOSIS — E034 Atrophy of thyroid (acquired): Secondary | ICD-10-CM | POA: Diagnosis not present

## 2022-01-13 DIAGNOSIS — Z789 Other specified health status: Secondary | ICD-10-CM | POA: Diagnosis not present

## 2022-01-13 DIAGNOSIS — I2510 Atherosclerotic heart disease of native coronary artery without angina pectoris: Secondary | ICD-10-CM | POA: Diagnosis not present

## 2022-03-01 DIAGNOSIS — H2513 Age-related nuclear cataract, bilateral: Secondary | ICD-10-CM | POA: Diagnosis not present

## 2022-04-15 DIAGNOSIS — I119 Hypertensive heart disease without heart failure: Secondary | ICD-10-CM | POA: Diagnosis not present

## 2022-04-15 DIAGNOSIS — E782 Mixed hyperlipidemia: Secondary | ICD-10-CM | POA: Diagnosis not present

## 2022-04-15 DIAGNOSIS — Z789 Other specified health status: Secondary | ICD-10-CM | POA: Diagnosis not present

## 2022-04-15 DIAGNOSIS — I2510 Atherosclerotic heart disease of native coronary artery without angina pectoris: Secondary | ICD-10-CM | POA: Diagnosis not present

## 2022-04-15 DIAGNOSIS — Z9181 History of falling: Secondary | ICD-10-CM | POA: Diagnosis not present

## 2022-04-15 DIAGNOSIS — E1169 Type 2 diabetes mellitus with other specified complication: Secondary | ICD-10-CM | POA: Diagnosis not present

## 2022-04-15 DIAGNOSIS — Z9861 Coronary angioplasty status: Secondary | ICD-10-CM | POA: Diagnosis not present

## 2022-04-15 DIAGNOSIS — E034 Atrophy of thyroid (acquired): Secondary | ICD-10-CM | POA: Diagnosis not present

## 2022-04-15 DIAGNOSIS — I6523 Occlusion and stenosis of bilateral carotid arteries: Secondary | ICD-10-CM | POA: Diagnosis not present

## 2022-04-25 DIAGNOSIS — J208 Acute bronchitis due to other specified organisms: Secondary | ICD-10-CM | POA: Diagnosis not present

## 2022-05-12 IMAGING — CT CT ANGIO AOBIFEM WO/W CM
1 of 14 series · 3 of 16 positions shown, 4 images · IV contrast (iopamidol)
Comparison: None.

CLINICAL DATA: Left lower extremity claudication

EXAM:
CT ANGIOGRAPHY OF ABDOMINAL AORTA WITH ILIOFEMORAL RUNOFF
TECHNIQUE: Multidetector CT imaging of the abdomen, pelvis and lower
extremities was performed using the standard protocol during bolus
administration of intravenous contrast. Multiplanar CT image
reconstructions and MIPs were obtained to evaluate the vascular
anatomy.
CONTRAST:  100mL 6TRNVL-7H6 IOPAMIDOL (6TRNVL-7H6) INJECTION 76%

[Series 5: cta whole body 2.00 bv36 s3 axial 2 mm · axial · 0.76mm/px · z∈[+393,+1591]mm · 3 of 600 slices shown, 4 images]
[im 1/600  soft-tissue]
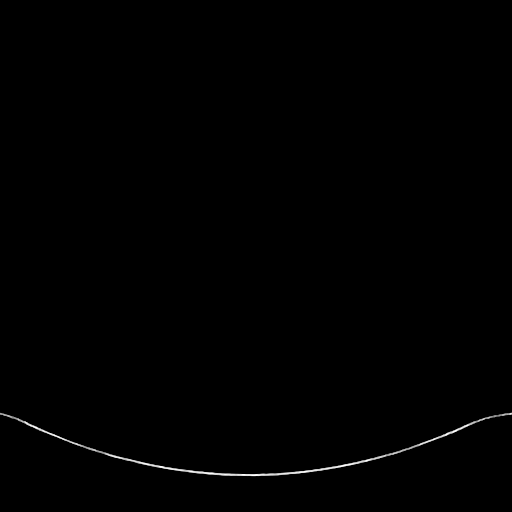
[im 1/600  bone]
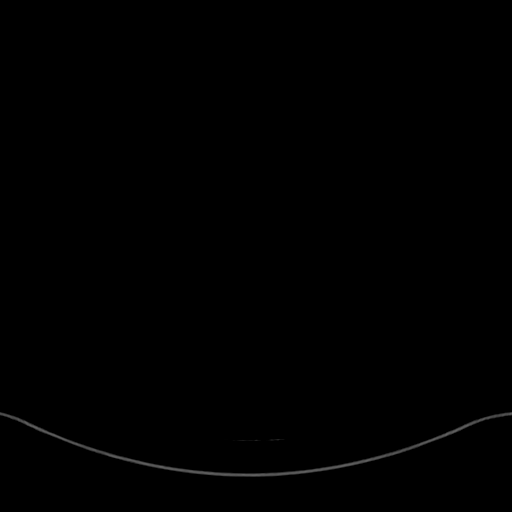
[im 300/600  soft-tissue]
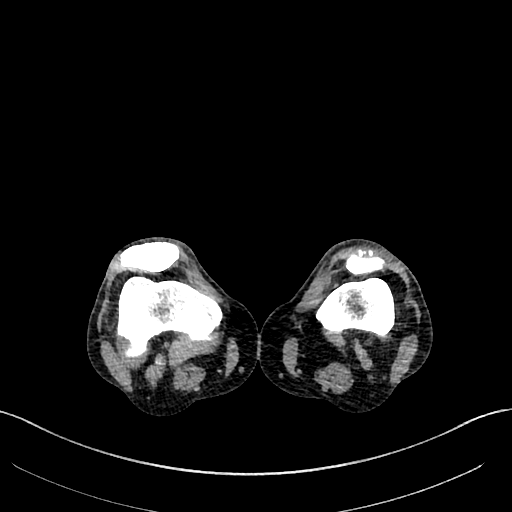
[im 600/600  soft-tissue]
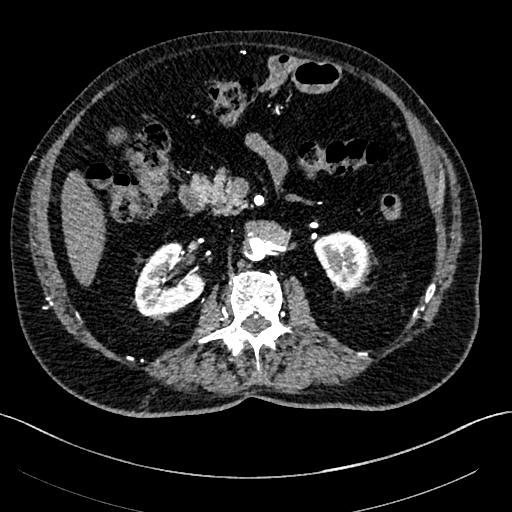

[3 of 16 positions shown; findings below may reference images not displayed]

FINDINGS: VASCULAR

Aorta: Advanced atherosclerotic disease in the visualized abdominal
aorta. The aorta occludes completely just proximal to the
bifurcation.

Renals: Partial imaging of the left renal arteries demonstrates 2
adjacent renal arteries which are patent.

IMA: High-grade stenosis at the origin of the IMA. The remaining IMA
is hypertrophic and provides collateral flow via the internal iliac
artery on the left.

RIGHT Lower Extremity

Inflow: The right common iliac artery is chronically occluded. There
is reconstitution at the iliac bifurcation. The internal iliac
artery is patent. No aneurysm or dissection. The external iliac
artery is patent without aneurysm.

Outflow: Heavily calcified atherosclerotic plaque results in
approximately 40% luminal narrowing in the common femoral artery.
The profunda femoral artery is patent. The superficial femoral
artery is patent with scattered atherosclerotic plaque. The
popliteal artery is relatively spared from disease.

Runoff: The anterior tibial artery is widely patent. The posterior
tibial artery is chronically occluded. The tibioperoneal trunk
continues as the peroneal artery to the ankle. Evaluation is
somewhat limited given relatively slow passage of intravenous
contrast.

LEFT Lower Extremity

Inflow: Chronic occlusion of the common iliac artery. There is
reconstitution at the iliac bifurcation. Calcified plaque results in
approximately 50% stenosis of the origin of the internal iliac
artery. The external iliac artery is diffusely diseased but remains
patent.

Outflow: Calcified plaque along the posterior wall of the common
femoral artery without significant luminal loss. The profunda
femoral artery is patent. The superficial femoral artery is patent
but diseased. The popliteal artery is relatively spared from
disease.

Runoff: Three-vessel runoff patent into the distal leg.
Visualization beyond this point is limited by slow transit of
contrast material from the proximal obstruction.

Veins: No obvious venous abnormality within the limitations of this
arterial phase study.

Review of the MIP images confirms the above findings.

NON-VASCULAR

Hepatobiliary: The visualized portion of the lower liver is
unremarkable in appearance.

Pancreas: The visualized portions of the pancreatic head uncinate
process are within normal limits. No mass or inflammatory changes.

Spleen: Not imaged.

Adrenals/Urinary Tract: The adrenal glands are not imaged. The
visualized portions of the kidneys demonstrate no evidence of
hydronephrosis, nephrolithiasis or enhancing renal mass. The ureters
and bladder are unremarkable.

Stomach/Bowel: No focal bowel wall thickening or evidence of
obstruction in the visualized portions of the bowel.

Lymphatic: No suspicious lymphadenopathy.

Reproductive: Prostatomegaly.

Other: Small fat containing periumbilical hernia. No abdominopelvic
ascites.

Musculoskeletal: No acute fracture or malalignment. Partially imaged
surgical changes of prior posterior lumbar interbody fusion with
interbody graft at L3-L4. L5-S1 degenerative disc disease is
present. ORIF of healed distal right fibular and tibial fractures
without evidence of hardware complication.
IMPRESSION: VASCULAR

1. Chronic occlusion of the distal abdominal aorta and bilateral
common iliac arteries. Imaging findings are consistent with Maykel
syndrome.
2. Limited evaluation of the runoff distribution beyond the distal
lower legs bilaterally secondary to slow transit of contrast from
the more proximal occlusion.
3. Chronic occlusion of the right posterior tibial artery.
4. Additional scattered atherosclerotic plaque without significant
stenosis or occlusion in the femoropopliteal segments.
5. Probable focal high-grade stenosis at the origin of the IMA.

NON-VASCULAR

1. No acute abnormality within the visualized abdomen.
2. Small fat containing periumbilical hernia.
3. Lower lumbar degenerative disc disease with prior L3-L4 posterior
lumbar interbody fusion.

## 2022-07-08 DIAGNOSIS — E1169 Type 2 diabetes mellitus with other specified complication: Secondary | ICD-10-CM | POA: Diagnosis not present

## 2022-07-08 DIAGNOSIS — R42 Dizziness and giddiness: Secondary | ICD-10-CM | POA: Diagnosis not present

## 2022-09-19 DIAGNOSIS — J0100 Acute maxillary sinusitis, unspecified: Secondary | ICD-10-CM | POA: Diagnosis not present

## 2023-01-02 ENCOUNTER — Encounter: Payer: Self-pay | Admitting: Internal Medicine

## 2023-01-02 ENCOUNTER — Ambulatory Visit: Payer: Medicare Other | Admitting: Internal Medicine

## 2023-01-02 VITALS — BP 144/78 | HR 63 | Temp 98.2°F | Resp 18 | Ht 70.0 in | Wt 176.0 lb

## 2023-01-02 DIAGNOSIS — I10 Essential (primary) hypertension: Secondary | ICD-10-CM | POA: Diagnosis not present

## 2023-01-02 DIAGNOSIS — I739 Peripheral vascular disease, unspecified: Secondary | ICD-10-CM

## 2023-01-02 DIAGNOSIS — H2513 Age-related nuclear cataract, bilateral: Secondary | ICD-10-CM | POA: Diagnosis not present

## 2023-01-02 DIAGNOSIS — C8590 Non-Hodgkin lymphoma, unspecified, unspecified site: Secondary | ICD-10-CM | POA: Diagnosis not present

## 2023-01-02 DIAGNOSIS — E119 Type 2 diabetes mellitus without complications: Secondary | ICD-10-CM | POA: Insufficient documentation

## 2023-01-02 DIAGNOSIS — R339 Retention of urine, unspecified: Secondary | ICD-10-CM

## 2023-01-02 DIAGNOSIS — Z6825 Body mass index (BMI) 25.0-25.9, adult: Secondary | ICD-10-CM

## 2023-01-02 DIAGNOSIS — E785 Hyperlipidemia, unspecified: Secondary | ICD-10-CM

## 2023-01-02 MED ORDER — ATENOLOL 50 MG PO TABS
50.0000 mg | ORAL_TABLET | Freq: Every day | ORAL | 6 refills | Status: DC
Start: 2023-01-02 — End: 2023-07-28

## 2023-01-02 MED ORDER — TAMSULOSIN HCL 0.4 MG PO CAPS: 0.4000 mg | ORAL_CAPSULE | Freq: Every day | ORAL | 3 refills | Status: DC

## 2023-01-02 NOTE — Assessment & Plan Note (Addendum)
Blood Pressure reading was elevated today. He will continue to monitor at blood pressure readings with cuff at home. Continues to take atenolol we will refill today. I've encouraged him to take the medication prior to appointment next time.

## 2023-01-02 NOTE — Assessment & Plan Note (Signed)
He will come back tomorrow for fasting for blood work.

## 2023-01-02 NOTE — Progress Notes (Signed)
Office Visit  Subjective   Patient ID: David Bird   DOB: 03/31/1936   Age: 87 y.o.   MRN: [id number]   Chief Complaint Chief Complaint  Patient presents with  . New Patient (Initial Visit)    Re-Establishing     History of Present Illness Mr. Hindley is an 87 year old white male who presents to re-establish care. He was receiving care from a practice in [place] approximately 3 months ago. He states "I'd rather not discuss why I don't I left." He then explains that there were some issues with appointment co-pays and scheduling conflicts. He has a history of lymphoma status post chemoradiation, coronary artery disease, diabetes, hypertension, dyslipidemia. He is taking rosuvastatin, atenolol, metformin, flomax, fish oils, vitamin C/D3. He says that he is married but his wife lives with their daughter in [place]. He is an avid Programmer, multimedia, enjoys bluegrass music in his free time. 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 Diet wise he watches his sugar and eats fruits and vegetables and does not drink alcohol or smoke cigarettes. He has not had any falls and does not use any assisted devices. He reports checking his glucose with ranges from 120-130 s, checks every other day or the third day. Today he's brought some medications with him, otherwise he denies any chest pain, shortness of breath, or fatigue. His last eye appointment was approximately 1 year ago.       Past Medical History Past Medical History:  Diagnosis Date  . Cancer ([facility])    Lymphona treated with Chemo and radiation  . Coronary artery disease   . Diabetes mellitus without complication ([facility])    Type II  . Dyslipidemia   . Gastric ulcer   . Hypertension   . Spinal stenosis   . Spondylolisthesis   . Stroke [facility])    2013ish, left hand slightly weaker than Right     Allergies Allergies  Allergen Reactions  . Penicillins Rash and Hives    Did it involve swelling of  the face/tongue/throat, SOB, or low BP? No Did it involve sudden or severe rash/hives, skin peeling, or any reaction on the inside of your mouth or nose? No Did you need to seek medical attention at a hospital or doctor's office? Yes When did it last happen?      40 years ago If all above answers are "NO", may proceed with cephalosporin use.  Did it involve swelling of the face/tongue/throat, SOB, or low BP? No Did it involve sudden or severe rash/hives, skin peeling, or any reaction on the inside of your mouth or nose? No Did you need to seek medical attention at a hospital or doctor's office? Yes When did it last happen?      40 years ago If all above answers are "NO", may proceed with cephalosporin use.     Medications  Current Outpatient Medications:  .  Acetaminophen (TYLENOL PO), Take 2 tablets by mouth 2 (two) times daily as needed (pain)., Disp: , Rfl:  .  Ascorbic Acid (VITAMIN C) 1000 MG tablet, Take 1,000 mg by mouth every other day., Disp: , Rfl:  .  bethanechol (URECHOLINE) 25 MG tablet, Take 25 mg by mouth daily. , Disp: , Rfl:  .  cilostazol (PLETAL) 100 MG tablet, Take 1 tablet (100 mg total) by mouth 2 (two) times daily before a meal., Disp: 60 tablet, Rfl: 11 .  demeclocycline (DECLOMYCIN) 150 MG tablet, Take  1 tablet (150 mg total) by mouth every 12 (twelve) hours., Disp: 60 tablet, Rfl: 0 .  metFORMIN (GLUCOPHAGE-XR) 500 MG 24 hr tablet, Take 500 mg by mouth 2 (two) times daily as needed (CBG >150). , Disp: , Rfl:  .  tamsulosin (FLOMAX) 0.4 MG CAPS capsule, Take 0.4 mg by mouth daily after supper. , Disp: , Rfl:    Review of Systems Review of Systems  Constitutional:  Negative for chills, fever, malaise/fatigue and weight loss.  HENT: Negative.    Eyes: Negative.   Respiratory:  Negative for cough, shortness of breath and wheezing.   Cardiovascular:  Negative for chest pain, palpitations and leg swelling.  Gastrointestinal: Negative.   Genitourinary:  Positive for  frequency and urgency. Negative for dysuria.  Musculoskeletal: Negative.   Skin: Negative.   Neurological:  Negative for dizziness and headaches.  Endo/Heme/Allergies:  Positive for environmental allergies.  Psychiatric/Behavioral: Negative.         Objective:    Vitals BP (!) 144/78 (BP Location: Left Arm, Patient Position: Sitting, Cuff Size: Normal)   Pulse 63   Temp 98.2 F (36.8 C)   Resp 18   Ht 5\' 10"  (1.778 m)   Wt 176 lb (79.8 kg)   SpO2 95%   BMI 25.25 kg/m    Physical Examination Physical Exam Constitutional:      Appearance: Normal appearance.  HENT:     Head: Normocephalic and atraumatic.     Nose: Nose normal.     Mouth/Throat:     Mouth: Mucous membranes are moist.     Pharynx: Oropharynx is clear.  Eyes:     Extraocular Movements: Extraocular movements intact.     Conjunctiva/sclera: Conjunctivae normal.     Pupils: Pupils are equal, round, and reactive to light.  Cardiovascular:     Rate and Rhythm: Normal rate and regular rhythm.     Pulses: Normal pulses.     Heart sounds: No murmur heard.    No gallop.  Pulmonary:     Effort: Pulmonary effort is normal.     Breath sounds: Normal breath sounds. No wheezing.  Abdominal:     General: Bowel sounds are normal.     Palpations: Abdomen is soft.     Tenderness: There is no abdominal tenderness. There is no right CVA tenderness, left CVA tenderness or guarding.  Musculoskeletal:        General: No swelling or tenderness. Normal range of motion.     Cervical back: Normal range of motion.  Skin:    General: Skin is warm and dry.     Capillary Refill: Capillary refill takes less than 2 seconds.  Neurological:     General: No focal deficit present.     Mental Status: He is alert.     Motor: No weakness.  Psychiatric:        Mood and Affect: Mood normal.        Behavior: Behavior normal.       Assessment & Plan:   Essential hypertension Blood Pressure reading was elevated today. He will  continue to monitor at blood pressure readings with cuff at home. Continues to take atenolol we will refill today.     No follow-ups on file.   Edwena Blow, NP

## 2023-01-03 ENCOUNTER — Ambulatory Visit: Payer: Medicare Other

## 2023-01-03 DIAGNOSIS — Z1321 Encounter for screening for nutritional disorder: Secondary | ICD-10-CM | POA: Diagnosis not present

## 2023-01-03 DIAGNOSIS — E119 Type 2 diabetes mellitus without complications: Secondary | ICD-10-CM | POA: Diagnosis not present

## 2023-01-03 DIAGNOSIS — C8590 Non-Hodgkin lymphoma, unspecified, unspecified site: Secondary | ICD-10-CM | POA: Insufficient documentation

## 2023-01-03 DIAGNOSIS — I739 Peripheral vascular disease, unspecified: Secondary | ICD-10-CM | POA: Diagnosis not present

## 2023-01-03 DIAGNOSIS — H2513 Age-related nuclear cataract, bilateral: Secondary | ICD-10-CM | POA: Insufficient documentation

## 2023-01-03 DIAGNOSIS — R339 Retention of urine, unspecified: Secondary | ICD-10-CM | POA: Insufficient documentation

## 2023-01-03 DIAGNOSIS — I10 Essential (primary) hypertension: Secondary | ICD-10-CM | POA: Diagnosis not present

## 2023-01-03 DIAGNOSIS — E785 Hyperlipidemia, unspecified: Secondary | ICD-10-CM | POA: Insufficient documentation

## 2023-01-03 DIAGNOSIS — Z1329 Encounter for screening for other suspected endocrine disorder: Secondary | ICD-10-CM | POA: Diagnosis not present

## 2023-01-03 LAB — POCT URINALYSIS DIPSTICK
Bilirubin, UA: NEGATIVE
Blood, UA: NEGATIVE
Glucose, UA: NEGATIVE
Ketones, UA: NEGATIVE
Leukocytes, UA: NEGATIVE
Nitrite, UA: NEGATIVE
Protein, UA: POSITIVE — AB
Spec Grav, UA: 1.02 (ref 1.010–1.025)
Urobilinogen, UA: 0.2 E.U./dL
pH, UA: 6.5 (ref 5.0–8.0)

## 2023-01-03 NOTE — Assessment & Plan Note (Signed)
Continue to see Dr. Chestine Spore with [facility]

## 2023-01-03 NOTE — Progress Notes (Signed)
Pt came in for fasting labs.

## 2023-01-03 NOTE — Assessment & Plan Note (Addendum)
I will refill his flomax 0.4 mg daily. A urine will be collected today.

## 2023-01-03 NOTE — Assessment & Plan Note (Signed)
At this time he will not see hematology.

## 2023-01-03 NOTE — Assessment & Plan Note (Signed)
Continue to see optometry.

## 2023-01-03 NOTE — Assessment & Plan Note (Signed)
Plan for labs to be collected at this visit for current results.

## 2023-01-04 LAB — LIPID PANEL
Chol/HDL Ratio: 8.9 ratio — ABNORMAL HIGH (ref 0.0–5.0)
Cholesterol, Total: 240 mg/dL — ABNORMAL HIGH (ref 100–199)
HDL: 27 mg/dL — ABNORMAL LOW (ref >39)
LDL Chol Calc (NIH): 135 mg/dL — ABNORMAL HIGH (ref 0–99)
Triglycerides: 428 mg/dL — ABNORMAL HIGH (ref 0–149)
VLDL Cholesterol Cal: 78 mg/dL — ABNORMAL HIGH (ref 5–40)

## 2023-01-04 LAB — CMP14 + ANION GAP
ALT: 21 IU/L (ref 0–44)
AST: 19 IU/L (ref 0–40)
Albumin/Globulin Ratio: 1.8
Albumin: 4.4 g/dL (ref 3.7–4.7)
Alkaline Phosphatase: 70 IU/L (ref 44–121)
Anion Gap: 13 mmol/L (ref 10.0–18.0)
BUN/Creatinine Ratio: 15 (ref 10–24)
BUN: 17 mg/dL (ref 8–27)
Bilirubin Total: 0.2 mg/dL (ref 0.0–1.2)
CO2: 25 mmol/L (ref 20–29)
Calcium: 9.4 mg/dL (ref 8.6–10.2)
Chloride: 101 mmol/L (ref 96–106)
Creatinine, Ser: 1.11 mg/dL (ref 0.76–1.27)
Globulin, Total: 2.5 g/dL (ref 1.5–4.5)
Glucose: 129 mg/dL — ABNORMAL HIGH (ref 70–99)
Potassium: 4.9 mmol/L (ref 3.5–5.2)
Sodium: 139 mmol/L (ref 134–144)
Total Protein: 6.9 g/dL (ref 6.0–8.5)
eGFR: 65 mL/min/{1.73_m2} (ref >59)

## 2023-01-04 LAB — CBC WITH DIFFERENTIAL/PLATELET
Basophils Absolute: 0 10*3/uL (ref 0.0–0.2)
Basos: 1 %
EOS (ABSOLUTE): 0.3 10*3/uL (ref 0.0–0.4)
Eos: 6 %
Hematocrit: 43.9 % (ref 37.5–51.0)
Hemoglobin: 14.3 g/dL (ref 13.0–17.7)
Immature Grans (Abs): 0 10*3/uL (ref 0.0–0.1)
Immature Granulocytes: 0 %
Lymphocytes Absolute: 1.5 10*3/uL (ref 0.7–3.1)
Lymphs: 28 %
MCH: 29.6 pg (ref 26.6–33.0)
MCHC: 32.6 g/dL (ref 31.5–35.7)
MCV: 91 fL (ref 79–97)
Monocytes Absolute: 0.6 10*3/uL (ref 0.1–0.9)
Monocytes: 11 %
Neutrophils Absolute: 3 10*3/uL (ref 1.4–7.0)
Neutrophils: 54 %
Platelets: 190 10*3/uL (ref 150–450)
RBC: 4.83 x10E6/uL (ref 4.14–5.80)
RDW: 14.1 % (ref 11.6–15.4)
WBC: 5.5 10*3/uL (ref 3.4–10.8)

## 2023-01-04 LAB — MICROALBUMIN / CREATININE URINE RATIO
Creatinine, Urine: 43.1 mg/dL
Microalb/Creat Ratio: 435 mg/g creat — ABNORMAL HIGH (ref 0–29)
Microalbumin, Urine: 187.5 ug/mL

## 2023-01-04 LAB — VITAMIN D 25 HYDROXY (VIT D DEFICIENCY, FRACTURES): Vit D, 25-Hydroxy: 21.4 ng/mL — ABNORMAL LOW (ref 30.0–100.0)

## 2023-01-04 LAB — TSH: TSH: 4.65 u[IU]/mL — ABNORMAL HIGH (ref 0.450–4.500)

## 2023-01-04 LAB — HEMOGLOBIN A1C
Est. average glucose Bld gHb Est-mCnc: 160 mg/dL
Hgb A1c MFr Bld: 7.2 % — ABNORMAL HIGH (ref 4.8–5.6)

## 2023-01-10 ENCOUNTER — Telehealth: Payer: Self-pay

## 2023-01-10 NOTE — Telephone Encounter (Signed)
Phone call states pt is not available. Will try again later

## 2023-01-10 NOTE — Telephone Encounter (Signed)
-----   Message from Edwena Blow, NP sent at 01/09/2023  4:56 PM EDT ----- David Bird labs are back. His A1C level is elevated to 7.2 which indicates Diabetes. His cholesterol levels are elevated and should be managed to prevent risk of heart attack and strokes. Vitamin D is low. He could add a over the counter vitamin D supplement.

## 2023-01-12 ENCOUNTER — Telehealth: Payer: Self-pay

## 2023-01-12 NOTE — Telephone Encounter (Signed)
Unable to contact pt with phone number not working. Will mail letter about results

## 2023-01-12 NOTE — Telephone Encounter (Signed)
-----   Message from Brandi L Leak, NP sent at 01/09/2023  4:56 PM EDT ----- David Bird labs are back. His A1C level is elevated to 7.2 which indicates Diabetes. His cholesterol levels are elevated and should be managed to prevent risk of heart attack and strokes. Vitamin D is low. He could add a over the counter vitamin D supplement.  

## 2023-02-20 ENCOUNTER — Other Ambulatory Visit: Payer: Self-pay | Admitting: Student

## 2023-04-03 ENCOUNTER — Ambulatory Visit: Payer: Medicare HMO | Admitting: Internal Medicine

## 2023-04-03 ENCOUNTER — Encounter: Payer: Self-pay | Admitting: Internal Medicine

## 2023-04-03 VITALS — BP 142/86 | HR 72 | Temp 97.6°F | Resp 18 | Ht 70.0 in | Wt 175.4 lb

## 2023-04-03 DIAGNOSIS — I739 Peripheral vascular disease, unspecified: Secondary | ICD-10-CM | POA: Diagnosis not present

## 2023-04-03 DIAGNOSIS — I10 Essential (primary) hypertension: Secondary | ICD-10-CM | POA: Diagnosis not present

## 2023-04-03 DIAGNOSIS — E1159 Type 2 diabetes mellitus with other circulatory complications: Secondary | ICD-10-CM | POA: Diagnosis not present

## 2023-04-03 DIAGNOSIS — E7800 Pure hypercholesterolemia, unspecified: Secondary | ICD-10-CM | POA: Diagnosis not present

## 2023-04-03 DIAGNOSIS — E039 Hypothyroidism, unspecified: Secondary | ICD-10-CM | POA: Insufficient documentation

## 2023-04-03 DIAGNOSIS — E1169 Type 2 diabetes mellitus with other specified complication: Secondary | ICD-10-CM | POA: Insufficient documentation

## 2023-04-03 DIAGNOSIS — E119 Type 2 diabetes mellitus without complications: Secondary | ICD-10-CM | POA: Diagnosis not present

## 2023-04-03 NOTE — Assessment & Plan Note (Signed)
He had an elevated cholesterol on his last visit.  He needs to be on a statin with his diabetes and PAD.  WE will repeat this again today.

## 2023-04-03 NOTE — Progress Notes (Signed)
Office Visit  Subjective   Patient ID: David Bird   DOB: [birth year]   Age: 87 y.o.   MRN: [id number]   Chief Complaint Chief Complaint  Patient presents with   Follow-up     History of Present Illness The patient is a 87 year old male who returns for a follow-up visit for his T2 diabetes.  He tells me that he was prediabetic for a number of years but he came back to our office in 12/2022 and at that time, his blood testing showed he had full blown diabetes.   Since the last visit, there have been no problems. He is currently on Metformin XR 500mg  daily (he is supposed to be on it twice a day).  He is walking with hunting and fishing.  He specifically denies unexplained abdominal pain, nausea or vomiting or diarrhea.   He is is not routinely check blood sugars.  His last HgBa1c was done 3 months ago and was 7.2%.  He came in fasting today in anticipation of lab work. He has no long term complications of diabetic retinopathy, nephropathy, or neuropathy but he does have PAD.  He has claudication complaints when he walks where gets pain in his thighs and hips.  He has seen vascular surgery in [place] where he was diagnosed with PAD in 05/2021.  They did a CTA of his abd/lower extremities and this showed a distal abdominal aortic occlusion as well as bilateral common iliac artery occlusion.  There was a discussion about intervention in which the patient declined and choose conservative management. Dr. Chestine Spore discussed the option of Pletal although unsure of how well this was going to help him given the complexity of his blockage.   His last eye exam was 1 year ago and he denies any problems with his vision today.    The patient is a 87 year old Caucasian/White male who presents for a follow-up evaluation of hypertension.  Since his last visit, he has not had any problems.  The patient has been checking his blood pressure at home. The patient's blood pressure has ranged systollically in the  130-170. The patient's current medications include: atenolol 50 mg oral tablet. The patient has been tolerating his medications well. The patient denies any headache, visual changes, dizziness, lightheadness, chest pain, shortness of breath, weakness/numbness, and edema.  He reports there have been no other symptoms noted.   The patient is a 87 year old Caucasian/White male who returns for a regularly scheduled thyroid check. I saw him a number of years ago where he was on levothyroxine 50 mcg oral tablet. He stopped this medication several years ago.  He claims to have no symptoms suggestive of thyroid imbalance specifically denying fatigue, cold intolerance, heat intolerance, tremors, anxiety, unexplained weight changes, and insomnia.  He had lab testing of his TSH in 12/2022 which was near normal.    Mr. Hrivnak also returns today for routine followup on her cholesterol.  He used to be on crestor but it seems he quit this on his own and he tells me today that he has not been on any cholesterol medicines for the last 2-3 years.   Overall, she states she is doing well and is without any complaints or problems at this time. She specifically denies nausea, vomiting, diarrhea, myalgias, and fatigue. She remains on dietary management alone for intervention and claims to be adhering well to it thus far. She is fasting in anticipation for labs today.  Past Medical History Past Medical History:  Diagnosis Date   Cancer (HCC)    Lymphona treated with Chemo and radiation   Coronary artery disease    Diabetes mellitus without complication (HCC)    Type II   Dyslipidemia    Gastric ulcer    Hypertension    Spinal stenosis    Spondylolisthesis    Stroke (HCC)    2013ish, left hand slightly weaker than Right     Allergies Allergies  Allergen Reactions   Penicillins Rash and Hives    Did it involve swelling of the face/tongue/throat, SOB, or low BP? No Did it involve sudden or severe rash/hives,  skin peeling, or any reaction on the inside of your mouth or nose? No Did you need to seek medical attention at a hospital or doctor's office? Yes When did it last happen?      40 years ago If all above answers are "NO", may proceed with cephalosporin use.  Did it involve swelling of the face/tongue/throat, SOB, or low BP? No Did it involve sudden or severe rash/hives, skin peeling, or any reaction on the inside of your mouth or nose? No Did you need to seek medical attention at a hospital or doctor's office? Yes When did it last happen?      40 years ago If all above answers are "NO", may proceed with cephalosporin use.     Medications  Current Outpatient Medications:    Acetaminophen (TYLENOL PO), Take 2 tablets by mouth 2 (two) times daily as needed (pain)., Disp: , Rfl:    Ascorbic Acid (VITAMIN C) 1000 MG tablet, Take 1,000 mg by mouth every other day., Disp: , Rfl:    atenolol (TENORMIN) 50 MG tablet, Take 1 tablet (50 mg total) by mouth daily., Disp: 30 tablet, Rfl: 6   bethanechol (URECHOLINE) 25 MG tablet, Take 25 mg by mouth daily. , Disp: , Rfl:    cilostazol (PLETAL) 100 MG tablet, Take 1 tablet (100 mg total) by mouth 2 (two) times daily before a meal., Disp: 60 tablet, Rfl: 11   demeclocycline (DECLOMYCIN) 150 MG tablet, Take 1 tablet (150 mg total) by mouth every 12 (twelve) hours., Disp: 60 tablet, Rfl: 0   metFORMIN (GLUCOPHAGE) 500 MG tablet, Take 1 tablet by mouth twice a day, Disp: 60 tablet, Rfl: 2   metFORMIN (GLUCOPHAGE-XR) 500 MG 24 hr tablet, Take 500 mg by mouth 2 (two) times daily as needed (CBG >150). , Disp: , Rfl:    tamsulosin (FLOMAX) 0.4 MG CAPS capsule, Take 1 capsule (0.4 mg total) by mouth daily after supper., Disp: 90 capsule, Rfl: 3   Review of Systems Review of Systems  Constitutional:  Negative for chills and fever.  Eyes:  Negative for blurred vision and double vision.  Respiratory:  Negative for cough and shortness of breath.   Cardiovascular:   Negative for chest pain, palpitations and leg swelling.  Gastrointestinal:  Negative for abdominal pain, constipation, diarrhea, nausea and vomiting.  Genitourinary:  Negative for frequency.  Musculoskeletal:  Negative for myalgias.  Skin:  Negative for itching and rash.  Neurological:  Negative for dizziness, weakness and headaches.  Endo/Heme/Allergies:  Negative for polydipsia.       Objective:    Vitals BP (!) 142/86   Pulse 72   Temp 97.6 F (36.4 C)   Resp 18   Ht 5\' 10"  (1.778 m)   Wt 175 lb 6.4 oz (79.6 kg)   SpO2 97%   BMI 25.17  kg/m    Physical Examination Physical Exam Constitutional:      Appearance: Normal appearance. He is not ill-appearing.  Cardiovascular:     Rate and Rhythm: Normal rate and regular rhythm.     Pulses: Normal pulses.     Heart sounds: No murmur heard.    No friction rub. No gallop.  Pulmonary:     Effort: Pulmonary effort is normal. No respiratory distress.     Breath sounds: No wheezing, rhonchi or rales.  Abdominal:     General: Abdomen is flat. Bowel sounds are normal. There is no distension.     Palpations: Abdomen is soft.     Tenderness: There is no abdominal tenderness.  Musculoskeletal:     Right lower leg: No edema.     Left lower leg: No edema.  Skin:    General: Skin is warm and dry.     Findings: No rash.  Neurological:     General: No focal deficit present.     Mental Status: He is alert and oriented to person, place, and time.  Psychiatric:        Mood and Affect: Mood normal.        Behavior: Behavior normal.        Assessment & Plan:   PAD (peripheral artery disease) (HCC) We will need to do a better job with risk factor modification.  He is not on a statin and he needs to be on one.  He used to be on an ASA but quit it years ago.  I want him on ASA 81mg  daily.  Essential hypertension His BP is a bit elevated.  I think he needs to be on an ACE-I but we will see what his BP is running on his next visit to  see if it really continues to be high.  Type 2 diabetes mellitus without complication, without long-term current use of insulin (HCC) He has diabetes associated with PAD.  I want his HgBA1c to really be less than 6.5% to reduce microvascular disease.  He is only taking metformin XR 500mg  once a day.  Further plan depends on his A1c.  Hypothyroidism He seems euthyroid.  I will check his TFT's today.  He is not on any medications.  Hypercholesterolemia He had an elevated cholesterol on his last visit.  He needs to be on a statin with his diabetes and PAD.  WE will repeat this again today.    Return in about 3 months (around 07/03/2023).   Crist Fat, MD

## 2023-04-03 NOTE — Assessment & Plan Note (Signed)
He has diabetes associated with PAD.  I want his HgBA1c to really be less than 6.5% to reduce microvascular disease.  He is only taking metformin XR 500mg  once a day.  Further plan depends on his A1c.

## 2023-04-03 NOTE — Assessment & Plan Note (Signed)
He seems euthyroid.  I will check his TFT's today.  He is not on any medications.

## 2023-04-03 NOTE — Assessment & Plan Note (Signed)
We will need to do a better job with risk factor modification.  He is not on a statin and he needs to be on one.  He used to be on an ASA but quit it years ago.  I want him on ASA 81mg  daily.

## 2023-04-03 NOTE — Assessment & Plan Note (Signed)
His BP is a bit elevated.  I think he needs to be on an ACE-I but we will see what his BP is running on his next visit to see if it really continues to be high.

## 2023-04-04 LAB — LIPID PANEL
Chol/HDL Ratio: 9.5 ratio — ABNORMAL HIGH (ref 0.0–5.0)
Cholesterol, Total: 294 mg/dL — ABNORMAL HIGH (ref 100–199)
HDL: 31 mg/dL — ABNORMAL LOW (ref >39)
LDL Chol Calc (NIH): 141 mg/dL — ABNORMAL HIGH (ref 0–99)
Triglycerides: 640 mg/dL (ref 0–149)
VLDL Cholesterol Cal: 122 mg/dL — ABNORMAL HIGH (ref 5–40)

## 2023-04-04 LAB — TSH: TSH: 6.21 u[IU]/mL — ABNORMAL HIGH (ref 0.450–4.500)

## 2023-04-04 LAB — T4, FREE: Free T4: 0.99 ng/dL (ref 0.82–1.77)

## 2023-04-04 LAB — HEMOGLOBIN A1C
Est. average glucose Bld gHb Est-mCnc: 169 mg/dL
Hgb A1c MFr Bld: 7.5 % — ABNORMAL HIGH (ref 4.8–5.6)

## 2023-04-05 ENCOUNTER — Other Ambulatory Visit: Payer: Self-pay

## 2023-04-05 DIAGNOSIS — E1159 Type 2 diabetes mellitus with other circulatory complications: Secondary | ICD-10-CM

## 2023-04-05 DIAGNOSIS — E7800 Pure hypercholesterolemia, unspecified: Secondary | ICD-10-CM

## 2023-04-05 MED ORDER — METFORMIN HCL 1000 MG PO TABS: 1000.0000 mg | ORAL_TABLET | Freq: Two times a day (BID) | ORAL | 1 refills | Status: DC

## 2023-04-05 MED ORDER — ATORVASTATIN CALCIUM 40 MG PO TABS
40.0000 mg | ORAL_TABLET | Freq: Every day | ORAL | 11 refills | Status: DC
Start: 2023-04-05 — End: 2023-08-11

## 2023-04-05 NOTE — Progress Notes (Signed)
New Rx

## 2023-04-05 NOTE — Progress Notes (Signed)
Increase his metformin to 1000mg  po daily (all at once). His cholesterol is not controlled. He needs to be on a statin on lipitor 40mg  at bedtime.   Could not get in touch with patient, called Son made aware of Lab results and new rx. Sent to pharmacy today 4:06

## 2023-04-07 ENCOUNTER — Ambulatory Visit: Payer: Medicare HMO

## 2023-04-07 VITALS — BP 184/86 | HR 58

## 2023-04-07 DIAGNOSIS — I10 Essential (primary) hypertension: Secondary | ICD-10-CM

## 2023-04-07 MED ORDER — AMLODIPINE BESYLATE 5 MG PO TABS
5.0000 mg | ORAL_TABLET | Freq: Every day | ORAL | 3 refills | Status: DC
Start: 2023-04-07 — End: 2024-01-02

## 2023-04-07 NOTE — Progress Notes (Signed)
BP CHECK , NURSE VISIT ONLY. Patient given 0.1 mg Tablet of Clonidine per Dr Nelson Chimes. At 2:23 p.m.  Unable to reach Dr Nelson Chimes, post med.  Per Dr Leonia Reader,  Patient given an additional 0.1 mg Tablet of Clonidine at 2:52 p.m.  Per Dr Leonia Reader Patient okay'd to go home but instructed to take new BP Med (Amlodipine 5mg  every day).  He has a person who drove him here, so he will not be driving.

## 2023-04-26 ENCOUNTER — Other Ambulatory Visit: Payer: Self-pay

## 2023-06-14 ENCOUNTER — Other Ambulatory Visit: Payer: Self-pay | Admitting: Internal Medicine

## 2023-06-14 ENCOUNTER — Other Ambulatory Visit: Payer: Self-pay

## 2023-06-14 DIAGNOSIS — E1159 Type 2 diabetes mellitus with other circulatory complications: Secondary | ICD-10-CM

## 2023-07-03 ENCOUNTER — Ambulatory Visit: Payer: Medicare HMO | Admitting: Internal Medicine

## 2023-07-27 ENCOUNTER — Other Ambulatory Visit: Payer: Self-pay | Admitting: Internal Medicine

## 2023-07-27 DIAGNOSIS — I10 Essential (primary) hypertension: Secondary | ICD-10-CM

## 2023-08-11 ENCOUNTER — Ambulatory Visit: Payer: Medicare HMO | Admitting: Internal Medicine

## 2023-08-11 ENCOUNTER — Encounter: Payer: Self-pay | Admitting: Internal Medicine

## 2023-08-11 VITALS — BP 124/70 | HR 60 | Temp 98.1°F | Resp 18 | Ht 70.0 in | Wt 181.0 lb

## 2023-08-11 DIAGNOSIS — I10 Essential (primary) hypertension: Secondary | ICD-10-CM | POA: Diagnosis not present

## 2023-08-11 DIAGNOSIS — E1169 Type 2 diabetes mellitus with other specified complication: Secondary | ICD-10-CM

## 2023-08-11 DIAGNOSIS — E785 Hyperlipidemia, unspecified: Secondary | ICD-10-CM | POA: Diagnosis not present

## 2023-08-11 DIAGNOSIS — I739 Peripheral vascular disease, unspecified: Secondary | ICD-10-CM

## 2023-08-11 NOTE — Progress Notes (Signed)
Office Visit  Subjective   Patient ID: David Bird   DOB: [birth year]   Age: 88 y.o.   MRN: [id number]   Chief Complaint Chief Complaint  Patient presents with   Essential hypertension    3 month follow up     History of Present Illness  88 years old male is here for follow up. He is watching his diet.  He has diabetes mellitus and take his medication and what she has diet.  He has not seen eye doctor within last 1 year and he was told that he has cataract,  he need surgery,  He says that he has noticed that his vision is getting worse and he is going to make an appointment to see eye doctor and have diabetic eye exam.  His last hemoglobin A1c was 7.1% in September.    He has hyperlipidemia and could not tolerate any statin because of muscle pain.  His last  LDL was 141. He is not interested in taking Repatha injection.  He is due for his lab drawn today.    He has a peripheral vessel disease and follow with vascular surgeon.  He take Pletal.   He has hypertension and his blood pressure is well controlled.  He take amlodipine 5 mg daily and atenolol 50 mg daily.  He is not taking any ACE-inhibitor or Arb.     Past Medical History Past Medical History:  Diagnosis Date   Cancer (HCC)    Lymphona treated with Chemo and radiation   Coronary artery disease    Diabetes mellitus without complication (HCC)    Type II   Dyslipidemia    Gastric ulcer    Hypertension    Spinal stenosis    Spondylolisthesis    Stroke (HCC)    2013ish, left hand slightly weaker than Right     Allergies Allergies  Allergen Reactions   Penicillins Rash and Hives    Did it involve swelling of the face/tongue/throat, SOB, or low BP? No Did it involve sudden or severe rash/hives, skin peeling, or any reaction on the inside of your mouth or nose? No Did you need to seek medical attention at a hospital or doctor's office? Yes When did it last happen?      40 years ago If all above answers are  "NO", may proceed with cephalosporin use.  Did it involve swelling of the face/tongue/throat, SOB, or low BP? No Did it involve sudden or severe rash/hives, skin peeling, or any reaction on the inside of your mouth or nose? No Did you need to seek medical attention at a hospital or doctor's office? Yes When did it last happen?      40 years ago If all above answers are "NO", may proceed with cephalosporin use.     Review of Systems Review of Systems  Constitutional: Negative.   HENT: Negative.    Respiratory: Negative.    Cardiovascular: Negative.   Gastrointestinal: Negative.   Neurological: Negative.        Objective:    Vitals BP 124/70 (BP Location: Left Arm, Patient Position: Sitting, Cuff Size: Normal)   Pulse 60   Temp 98.1 F (36.7 C)   Resp 18   Ht 5\' 10"  (1.778 m)   Wt 181 lb (82.1 kg)   SpO2 95%   BMI 25.97 kg/m    Physical Examination Physical Exam Constitutional:      Appearance: Normal appearance.  HENT:     Head: Normocephalic and  atraumatic.  Cardiovascular:     Rate and Rhythm: Normal rate and regular rhythm.     Heart sounds: Normal heart sounds.  Pulmonary:     Effort: Pulmonary effort is normal.     Breath sounds: Normal breath sounds.  Abdominal:     General: Bowel sounds are normal.     Palpations: Abdomen is soft.  Neurological:     General: No focal deficit present.     Mental Status: He is alert and oriented to person, place, and time.        Assessment & Plan:   Essential hypertension   His blood pressure is controlled.  PAD (peripheral artery disease) (HCC)   He take Pletal on and follow-up with vascular surgeon.  Dyslipidemia associated with type 2 diabetes mellitus (HCC)   I will do lipid panel, hemoglobin A1c and urine microalbumin urea today.    Return in about 3 months (around 11/09/2023).   Eloisa Northern, MD

## 2023-08-11 NOTE — Assessment & Plan Note (Signed)
His blood pressure is controlled.

## 2023-08-11 NOTE — Assessment & Plan Note (Signed)
I will do lipid panel, hemoglobin A1c and urine microalbumin urea today.

## 2023-08-11 NOTE — Assessment & Plan Note (Signed)
He take Pletal on and follow-up with vascular surgeon.

## 2023-08-12 LAB — MICROALBUMIN / CREATININE URINE RATIO
Creatinine, Urine: 58 mg/dL
Microalb/Creat Ratio: 392 mg/g{creat} — ABNORMAL HIGH (ref 0–29)
Microalbumin, Urine: 227.2 ug/mL

## 2023-08-12 LAB — LIPID PANEL
Chol/HDL Ratio: 7.9 {ratio} — ABNORMAL HIGH (ref 0.0–5.0)
Cholesterol, Total: 245 mg/dL — ABNORMAL HIGH (ref 100–199)
HDL: 31 mg/dL — ABNORMAL LOW (ref >39)
LDL Chol Calc (NIH): 157 mg/dL — ABNORMAL HIGH (ref 0–99)
Triglycerides: 304 mg/dL — ABNORMAL HIGH (ref 0–149)
VLDL Cholesterol Cal: 57 mg/dL — ABNORMAL HIGH (ref 5–40)

## 2023-08-12 LAB — CMP14 + ANION GAP
ALT: 21 [IU]/L (ref 0–44)
AST: 23 [IU]/L (ref 0–40)
Albumin: 4.4 g/dL (ref 3.7–4.7)
Alkaline Phosphatase: 65 [IU]/L (ref 44–121)
Anion Gap: 13 mmol/L (ref 10.0–18.0)
BUN/Creatinine Ratio: 14 (ref 10–24)
BUN: 15 mg/dL (ref 8–27)
Bilirubin Total: 0.4 mg/dL (ref 0.0–1.2)
CO2: 25 mmol/L (ref 20–29)
Calcium: 9.6 mg/dL (ref 8.6–10.2)
Chloride: 99 mmol/L (ref 96–106)
Creatinine, Ser: 1.11 mg/dL (ref 0.76–1.27)
Globulin, Total: 2.7 g/dL (ref 1.5–4.5)
Glucose: 118 mg/dL — ABNORMAL HIGH (ref 70–99)
Potassium: 4.9 mmol/L (ref 3.5–5.2)
Sodium: 137 mmol/L (ref 134–144)
Total Protein: 7.1 g/dL (ref 6.0–8.5)
eGFR: 65 mL/min/{1.73_m2} (ref >59)

## 2023-08-12 LAB — HEMOGLOBIN A1C
Est. average glucose Bld gHb Est-mCnc: 151 mg/dL
Hgb A1c MFr Bld: 6.9 % — ABNORMAL HIGH (ref 4.8–5.6)

## 2023-11-08 ENCOUNTER — Ambulatory Visit: Payer: Medicare HMO | Admitting: Internal Medicine

## 2023-11-13 ENCOUNTER — Encounter: Payer: Self-pay | Admitting: Internal Medicine

## 2023-11-13 ENCOUNTER — Ambulatory Visit: Admitting: Internal Medicine

## 2023-11-13 VITALS — BP 176/84 | HR 58 | Temp 97.2°F | Resp 18 | Ht 70.0 in | Wt 181.0 lb

## 2023-11-13 DIAGNOSIS — E1169 Type 2 diabetes mellitus with other specified complication: Secondary | ICD-10-CM | POA: Diagnosis not present

## 2023-11-13 DIAGNOSIS — E785 Hyperlipidemia, unspecified: Secondary | ICD-10-CM

## 2023-11-13 DIAGNOSIS — I10 Essential (primary) hypertension: Secondary | ICD-10-CM | POA: Diagnosis not present

## 2023-11-13 MED ORDER — LOSARTAN POTASSIUM 25 MG PO TABS: 25.0000 mg | ORAL_TABLET | Freq: Every day | ORAL | 6 refills | Status: AC

## 2023-11-13 NOTE — Addendum Note (Signed)
 Addended by: Leslee Suire on: 11/13/2023 11:10 AM   Modules accepted: Orders

## 2023-11-13 NOTE — Assessment & Plan Note (Signed)
 His sugar is well controlled, but his cholesterol is very high but he does not want to take any medicine.

## 2023-11-13 NOTE — Progress Notes (Signed)
 Office Visit  Subjective   Patient ID: David Bird   DOB: 05/29/1936   Age: 88 y.o.   MRN: [id number]   Chief Complaint Chief Complaint  Patient presents with   Follow-up    3 month follow up     History of Present Illness 88 years old male is here for follow up.  He has diabetes mellitus and takes metformin  1000 mg every other day because of diarrhea. He has not seen eye doctor but her daughter will make an appointment in [place]. His Hb A1c as 6.9% in 1/25 and he has microalbuminuria as well.    He has hyperlipidemia and could not tolerate any statin because of muscle pain.  His last  LDL was 157 in 07/2023 and he does not want to take any medications, he could not tolerate statin and does not want to take zetia and Repatha injction either.    He has a peripheral vessel disease and follow with vascular surgeon.  He take Pletal .   He has hypertension and his blood pressure is high, he says he check it every day and it has been controlled. He wants everything to be kept the same way for now. He take amlodipine  5 mg daily and atenolol  50 mg daily.  He is ok to take losartan  for proteinuria.   Past Medical History Past Medical History:  Diagnosis Date   Cancer (HCC)    Lymphona treated with Chemo and radiation   Coronary artery disease    Diabetes mellitus without complication (HCC)    Type II   Dyslipidemia    Gastric ulcer    Hypertension    Spinal stenosis    Spondylolisthesis    Stroke (HCC)    2013ish, left hand slightly weaker than Right     Allergies Allergies  Allergen Reactions   Penicillins Rash and Hives    Did it involve swelling of the face/tongue/throat, SOB, or low BP? No Did it involve sudden or severe rash/hives, skin peeling, or any reaction on the inside of your mouth or nose? No Did you need to seek medical attention at a hospital or doctor's office? Yes When did it last happen?      40 years ago If all above answers are "NO", may proceed  with cephalosporin use.  Did it involve swelling of the face/tongue/throat, SOB, or low BP? No Did it involve sudden or severe rash/hives, skin peeling, or any reaction on the inside of your mouth or nose? No Did you need to seek medical attention at a hospital or doctor's office? Yes When did it last happen?      40 years ago If all above answers are "NO", may proceed with cephalosporin use.     Review of Systems Review of Systems  Constitutional: Negative.   HENT: Negative.    Respiratory: Negative.    Cardiovascular: Negative.   Gastrointestinal: Negative.   Neurological: Negative.        Objective:    Vitals BP (!) 176/84 (BP Location: Left Arm, Patient Position: Sitting)   Pulse (!) 58   Temp (!) 97.2 F (36.2 C)   Resp 18   Ht 5\' 10"  (1.778 m)   Wt 181 lb (82.1 kg)   SpO2 97%   BMI 25.97 kg/m    Physical Examination Physical Exam Constitutional:      Appearance: Normal appearance.  HENT:     Head: Normocephalic and atraumatic.  Cardiovascular:     Rate and Rhythm:  Normal rate and regular rhythm.     Heart sounds: Normal heart sounds.  Pulmonary:     Effort: Pulmonary effort is normal.     Breath sounds: Normal breath sounds.  Abdominal:     General: Bowel sounds are normal.     Palpations: Abdomen is soft.  Neurological:     General: No focal deficit present.     Mental Status: He is alert and oriented to person, place, and time.        Assessment & Plan:   Essential hypertension I will add losartan  25 mg daily this will help with proteinuria as well.   Dyslipidemia associated with type 2 diabetes mellitus (HCC) His sugar is well controlled, but his cholesterol is very high but he does not want to take any medicine.     Return in about 3 months (around 02/12/2024).   Tita Form, MD

## 2023-11-13 NOTE — Assessment & Plan Note (Signed)
 I will add losartan  25 mg daily this will help with proteinuria as well.

## 2023-11-20 ENCOUNTER — Ambulatory Visit: Admitting: Internal Medicine

## 2023-11-20 DIAGNOSIS — E7800 Pure hypercholesterolemia, unspecified: Secondary | ICD-10-CM

## 2023-11-20 NOTE — Progress Notes (Signed)
 Nurse visit  Blood draw

## 2023-11-21 LAB — BASIC METABOLIC PANEL WITH GFR
BUN/Creatinine Ratio: 18 (ref 10–24)
BUN: 16 mg/dL (ref 8–27)
CO2: 23 mmol/L (ref 20–29)
Calcium: 9.2 mg/dL (ref 8.6–10.2)
Chloride: 98 mmol/L (ref 96–106)
Creatinine, Ser: 0.89 mg/dL (ref 0.76–1.27)
Glucose: 188 mg/dL — ABNORMAL HIGH (ref 70–99)
Potassium: 4.9 mmol/L (ref 3.5–5.2)
Sodium: 137 mmol/L (ref 134–144)
eGFR: 83 mL/min/{1.73_m2} (ref >59)

## 2023-11-22 NOTE — Progress Notes (Signed)
 Patient called.  Patient aware. Please tell him that his labs are good except blood sugar is high

## 2023-12-18 DIAGNOSIS — R9431 Abnormal electrocardiogram [ECG] [EKG]: Secondary | ICD-10-CM | POA: Diagnosis not present

## 2023-12-18 DIAGNOSIS — I440 Atrioventricular block, first degree: Secondary | ICD-10-CM | POA: Diagnosis not present

## 2023-12-18 DIAGNOSIS — E119 Type 2 diabetes mellitus without complications: Secondary | ICD-10-CM | POA: Diagnosis not present

## 2023-12-18 DIAGNOSIS — E871 Hypo-osmolality and hyponatremia: Secondary | ICD-10-CM | POA: Diagnosis not present

## 2023-12-18 DIAGNOSIS — I6389 Other cerebral infarction: Secondary | ICD-10-CM | POA: Diagnosis not present

## 2023-12-18 DIAGNOSIS — Z79899 Other long term (current) drug therapy: Secondary | ICD-10-CM | POA: Diagnosis not present

## 2023-12-18 DIAGNOSIS — I69954 Hemiplegia and hemiparesis following unspecified cerebrovascular disease affecting left non-dominant side: Secondary | ICD-10-CM | POA: Diagnosis not present

## 2023-12-18 DIAGNOSIS — Z7902 Long term (current) use of antithrombotics/antiplatelets: Secondary | ICD-10-CM | POA: Diagnosis not present

## 2023-12-18 DIAGNOSIS — Z7982 Long term (current) use of aspirin: Secondary | ICD-10-CM | POA: Diagnosis not present

## 2023-12-18 DIAGNOSIS — Z91199 Patient's noncompliance with other medical treatment and regimen due to unspecified reason: Secondary | ICD-10-CM | POA: Diagnosis not present

## 2023-12-18 DIAGNOSIS — I10 Essential (primary) hypertension: Secondary | ICD-10-CM | POA: Diagnosis not present

## 2023-12-19 DIAGNOSIS — Z8673 Personal history of transient ischemic attack (TIA), and cerebral infarction without residual deficits: Secondary | ICD-10-CM | POA: Diagnosis not present

## 2023-12-19 DIAGNOSIS — R2990 Unspecified symptoms and signs involving the nervous system: Secondary | ICD-10-CM | POA: Diagnosis not present

## 2023-12-19 DIAGNOSIS — R29898 Other symptoms and signs involving the musculoskeletal system: Secondary | ICD-10-CM | POA: Diagnosis not present

## 2023-12-19 DIAGNOSIS — R202 Paresthesia of skin: Secondary | ICD-10-CM | POA: Diagnosis not present

## 2023-12-19 DIAGNOSIS — R531 Weakness: Secondary | ICD-10-CM | POA: Diagnosis not present

## 2023-12-19 DIAGNOSIS — I340 Nonrheumatic mitral (valve) insufficiency: Secondary | ICD-10-CM | POA: Diagnosis not present

## 2023-12-19 DIAGNOSIS — R200 Anesthesia of skin: Secondary | ICD-10-CM | POA: Diagnosis not present

## 2023-12-19 DIAGNOSIS — I350 Nonrheumatic aortic (valve) stenosis: Secondary | ICD-10-CM | POA: Diagnosis not present

## 2023-12-25 ENCOUNTER — Encounter: Payer: Self-pay | Admitting: Internal Medicine

## 2023-12-25 ENCOUNTER — Ambulatory Visit: Admitting: Internal Medicine

## 2023-12-25 VITALS — BP 134/84 | HR 84 | Temp 97.9°F | Resp 18 | Ht 70.0 in | Wt 178.5 lb

## 2023-12-25 DIAGNOSIS — I10 Essential (primary) hypertension: Secondary | ICD-10-CM

## 2023-12-25 DIAGNOSIS — E1169 Type 2 diabetes mellitus with other specified complication: Secondary | ICD-10-CM

## 2023-12-25 DIAGNOSIS — G8194 Hemiplegia, unspecified affecting left nondominant side: Secondary | ICD-10-CM

## 2023-12-25 MED ORDER — GLUCOSE BLOOD VI STRP: ORAL_STRIP | 12 refills | Status: AC

## 2023-12-25 MED ORDER — ATORVASTATIN CALCIUM 80 MG PO TABS: 80.0000 mg | ORAL_TABLET | Freq: Every day | ORAL | 4 refills | Status: DC

## 2023-12-25 NOTE — Progress Notes (Signed)
 Office Visit  Subjective   Patient ID: David Bird   DOB: 1935-09-23   Age: 88 y.o.   MRN: [id number]   Chief Complaint Chief Complaint  Patient presents with   Hospitalization Follow-up    Follow up     History of Present Illness 88 years old male who was admitted to [facility] on 12/18/23 and discharge home on 12/19/23.Patient has a past medical history significant for CVA with left-sided weakness, diabetes mellitus, hypertension, hyperlipidemia and BPH who came to the emergency room with worsening of left-sided weakness.  He also has a borderline diabetes and was prescribed metformin  that he was not taking.  He was also not taking statin that was prescribed for hyperlipidemia.  CT head without contrast shows no acute intracranial abnormality.  CTA head and neck was negative for large vessel occlusion or high-grade stenosis.  Patient was evaluated by tele Neurology.  Patient was diagnosed with acute CVA and started on Plavix 75 mg daily and aspirin 81 mg daily for 3 weeks then Plavix alone.  His LDL was 119. Patient was started on atorvastatin  40 mg daily.  His hemoglobin was 7.5 and he was started on metformin  500 mg twice a day.  MRI brain showed a small acute stroke in the right thalamus.  PT OT evaluated and recommended no further recommendation.  Patient he is here for follow-up.  I have reviewed hospital record and discussed medication with him.  This is a transition of care visit.  Patient need to follow up with neurologist.  He denies any side effects from atorvastatin .  Lab chart and hospital record was reviewed.  Past Medical History Past Medical History:  Diagnosis Date   Cancer (HCC)    Lymphona treated with Chemo and radiation   Coronary artery disease    Diabetes mellitus without complication (HCC)    Type II   Dyslipidemia    Gastric ulcer    Hypertension    Spinal stenosis    Spondylolisthesis    Stroke (HCC)    2013ish, left hand slightly weaker than  Right     Allergies Allergies  Allergen Reactions   Penicillins Rash and Hives    Did it involve swelling of the face/tongue/throat, SOB, or low BP? No Did it involve sudden or severe rash/hives, skin peeling, or any reaction on the inside of your mouth or nose? No Did you need to seek medical attention at a hospital or doctor's office? Yes When did it last happen?      40 years ago If all above answers are "NO", may proceed with cephalosporin use.  Did it involve swelling of the face/tongue/throat, SOB, or low BP? No Did it involve sudden or severe rash/hives, skin peeling, or any reaction on the inside of your mouth or nose? No Did you need to seek medical attention at a hospital or doctor's office? Yes When did it last happen?      40 years ago If all above answers are "NO", may proceed with cephalosporin use.     Review of Systems Review of Systems  Constitutional: Negative.   Respiratory: Negative.    Cardiovascular: Negative.   Gastrointestinal: Negative.   Neurological:          Left-sided weakness as a baseline       Objective:    Vitals BP 134/84   Pulse 84   Temp 97.9 F (36.6 C)   Resp 18   Ht 5' 10 (1.778 m)  Wt 178 lb 8 oz (81 kg)   SpO2 95%   BMI 25.61 kg/m    Physical Examination Physical Exam Constitutional:      Appearance: Normal appearance.  HENT:     Head: Normocephalic and atraumatic.   Cardiovascular:     Rate and Rhythm: Normal rate and regular rhythm.     Heart sounds: Normal heart sounds.  Pulmonary:     Effort: Pulmonary effort is normal.     Breath sounds: Normal breath sounds.  Abdominal:     General: Abdomen is flat.     Palpations: Abdomen is soft.   Neurological:     Mental Status: He is alert.     Comments:  Left-sided weakness.       Assessment & Plan:   Essential hypertension   His blood pressure is controlled.  Dyslipidemia associated with type 2 diabetes mellitus (HCC)   He has diabetes mellitus with  hemoglobin A1c of 7.5% and he is taking metformin  500 mg twice a day.  He was also started on atorvastatin  40 mg because of LDL of 119 that was not target controlled.  He does not have any symptoms.  Will see him back in 3 months and repeat labs.  He will also follow with neurologist.    Left hemiparesis (HCC)   He has acute stroke in the right thalamus.  He will take aspirin and Plavix for 3 weeks then Plavix alone. He does not need physical therapy at the moment.     This is a transition of care visit.  I will review medication and reviewed discharge summary.  Will repeat labs on next visit.  He will follow with neurologist.  Return in about 3 months (around 03/26/2024).   Tita Form, MD

## 2024-01-02 ENCOUNTER — Other Ambulatory Visit: Payer: Self-pay | Admitting: Internal Medicine

## 2024-01-02 DIAGNOSIS — I10 Essential (primary) hypertension: Secondary | ICD-10-CM

## 2024-01-07 DIAGNOSIS — G8194 Hemiplegia, unspecified affecting left nondominant side: Secondary | ICD-10-CM | POA: Insufficient documentation

## 2024-01-07 NOTE — Assessment & Plan Note (Signed)
His blood pressure is controlled.

## 2024-01-07 NOTE — Assessment & Plan Note (Signed)
 He has diabetes mellitus with hemoglobin A1c of 7.5% and he is taking metformin  500 mg twice a day.  He was also started on atorvastatin  40 mg because of LDL of 119 that was not target controlled.  He does not have any symptoms.  Will see him back in 3 months and repeat labs.  He will also follow with neurologist.

## 2024-01-07 NOTE — Assessment & Plan Note (Signed)
 He has acute stroke in the right thalamus.  He will take aspirin and Plavix for 3 weeks then Plavix alone. He does not need physical therapy at the moment.

## 2024-01-12 ENCOUNTER — Other Ambulatory Visit: Payer: Self-pay

## 2024-01-16 ENCOUNTER — Other Ambulatory Visit: Payer: Self-pay

## 2024-01-18 DIAGNOSIS — I10 Essential (primary) hypertension: Secondary | ICD-10-CM | POA: Diagnosis not present

## 2024-01-18 DIAGNOSIS — E785 Hyperlipidemia, unspecified: Secondary | ICD-10-CM | POA: Diagnosis not present

## 2024-01-18 DIAGNOSIS — G8194 Hemiplegia, unspecified affecting left nondominant side: Secondary | ICD-10-CM

## 2024-01-18 DIAGNOSIS — E1169 Type 2 diabetes mellitus with other specified complication: Secondary | ICD-10-CM | POA: Diagnosis not present

## 2024-02-05 ENCOUNTER — Other Ambulatory Visit: Payer: Self-pay | Admitting: Internal Medicine

## 2024-02-05 DIAGNOSIS — R339 Retention of urine, unspecified: Secondary | ICD-10-CM

## 2024-02-05 DIAGNOSIS — I10 Essential (primary) hypertension: Secondary | ICD-10-CM

## 2024-02-12 ENCOUNTER — Ambulatory Visit: Admitting: Internal Medicine

## 2024-02-12 ENCOUNTER — Other Ambulatory Visit: Payer: Self-pay | Admitting: Internal Medicine

## 2024-02-12 ENCOUNTER — Encounter: Payer: Self-pay | Admitting: Internal Medicine

## 2024-02-12 VITALS — BP 128/64 | HR 69 | Temp 97.6°F | Resp 18 | Ht 70.0 in | Wt 169.2 lb

## 2024-02-12 DIAGNOSIS — E039 Hypothyroidism, unspecified: Secondary | ICD-10-CM

## 2024-02-12 DIAGNOSIS — I10 Essential (primary) hypertension: Secondary | ICD-10-CM

## 2024-02-12 DIAGNOSIS — E1159 Type 2 diabetes mellitus with other circulatory complications: Secondary | ICD-10-CM | POA: Diagnosis not present

## 2024-02-12 DIAGNOSIS — I679 Cerebrovascular disease, unspecified: Secondary | ICD-10-CM | POA: Diagnosis not present

## 2024-02-12 DIAGNOSIS — E785 Hyperlipidemia, unspecified: Secondary | ICD-10-CM | POA: Diagnosis not present

## 2024-02-12 MED ORDER — PRAVASTATIN SODIUM 80 MG PO TABS: 80.0000 mg | ORAL_TABLET | Freq: Every evening | ORAL | 11 refills | Status: DC

## 2024-02-12 NOTE — Assessment & Plan Note (Signed)
 He has had statin intolerance to crestor  and lipitor.  I am going to start him on max dose pravastatin .  He is probably going to need a PSK9 inhibitor and this was discussed with the patient.  We will check his FLP today but he quit his lipitor about 2 weeks ago.

## 2024-02-12 NOTE — Assessment & Plan Note (Signed)
 He was noncompliant with his metformin  use in the past.  He is back on metformin .  I will check his HgBA1c today.

## 2024-02-12 NOTE — Assessment & Plan Note (Signed)
We will check his TFT's today.  He seems euthyroid.

## 2024-02-12 NOTE — Progress Notes (Signed)
 Office Visit  Subjective   Patient ID: David Bird   DOB: [birth year]   Age: 88 y.o.   MRN: [id number]   Chief Complaint Chief Complaint  Patient presents with   Hypertension    Pt in today fora  BP follow uo. The patient reports he is checkinhg his BP at home daily and it has been ranging fro 140's/80's.     History of Present Illness The patient is a 88 years old male old male who has a history of cerebrovascular disease with history of stroke.  He was admitted to [facility] in 11/2023 with worsening of left-sided weakness.  He also has a borderline diabetes and was prescribed metformin  that he was not taking.  He was also not taking statin that was prescribed for hyperlipidemia.  CT head without contrast shows no acute intracranial abnormality.  CTA head and neck was negative for large vessel occlusion or high-grade stenosis.  Patient was evaluated by tele Neurology.  Patient was diagnosed with acute CVA of the right thalamus and started on Plavix 75 mg daily and aspirin 81 mg daily for 3 weeks then ASA alone.   The patient was started on atorvastatin  40 mg daily.  His HgBA1c was 7.5% and he was started on metformin  500 mg twice a day.  MRI brain showed a small acute stroke in the right thalamus.  PT OT evaluated and recommended no further recommendation.  The patient states he does not have an appointment with neurology.  The patient is a 88 year old male who returns for a follow-up visit for his T2 diabetes.  He tells me that he was prediabetic for a number of years but he came back to our office in 12/2022 and at that time, his blood testing showed he had full blown diabetes.   He is currently on Metformin  500mg  BID.  He is walking with hunting and fishing.  He specifically denies unexplained abdominal pain, nausea or vomiting or diarrhea.   He is routinely check blood sugars daily and runs 100-150's.  His last HgBa1c was done 6 months ago and was 6.9%.  He came in fasting today in  anticipation of lab work. He has no long term complications of diabetic retinopathy, nephropathy, or neuropathy but he does have PAD.  He has claudication complaints when he walks where gets pain in his thighs and hips.  He has seen vascular surgery in [place] where he was diagnosed with PAD in 05/2021.  They did a CTA of his abd/lower extremities and this showed a distal abdominal aortic occlusion as well as bilateral common iliac artery occlusion.  There was a discussion about intervention in which the patient declined and choose conservative management. Dr. Gretta discussed the option of Pletal  although unsure of how well this was going to help him given the complexity of his blockage.   His last eye exam was 1 year ago and he denies any problems with his vision today.     The patient is a 88 year old Caucasian/White male Caucasian/White male who presents for a follow-up evaluation of hypertension.  Since his last visit, he has not had any problems.  The patient has been checking his blood pressure at home. The patient's blood pressure has ranged systollically in the 140's. The patient's current medications include: atenolol  50 mg daily, amlodipine  5mg  and losartan  25mg  daily. The patient has been tolerating his medications well. The patient denies any headache, visual changes, dizziness, lightheadness, chest pain, shortness of breath, weakness/numbness, and  edema.  He reports there have been no other symptoms noted.    The patient is a 88 year old Caucasian/White male Caucasian/White male who returns for a regularly scheduled thyroid  check. I saw him a number of years ago where he was on levothyroxine 50 mcg oral tablet. He stopped this medication several years ago.  He claims to have no symptoms suggestive of thyroid  imbalance specifically denying fatigue, cold intolerance, heat intolerance, tremors, anxiety, unexplained weight changes, and insomnia.  He had lab testing of his TSH in 12/2022 which was near normal.     David Bird also returns  today for routine followup on his cholesterol.  He used to be on crestor  but it seems he quit this on his own due to myalgias.  They placed him on atorvastatin  40mg  at the hospital as above but he stopped it due to myalgias.  Overall, he states he is doing well and is without any complaints or problems at this time. He specifically denies nausea, vomiting, diarrhea, myalgias, and fatigue. He was on atorvastatin  40mg  at bedtime but he stopped it due to myalgias where he stopped it 2-3 weeks ago. He is fasting in anticipation for labs today.       Past Medical History Past Medical History:  Diagnosis Date   Cancer (HCC)    Lymphona treated with Chemo and radiation   Coronary artery disease    Diabetes mellitus without complication (HCC)    Type II   Dyslipidemia    Gastric ulcer    Hypertension    Spinal stenosis    Spondylolisthesis    Stroke (HCC)    2013ish, left hand slightly weaker than Right     Allergies Allergies  Allergen Reactions   Penicillins Rash and Hives    Did it involve swelling of the face/tongue/throat, SOB, or low BP? No Did it involve sudden or severe rash/hives, skin peeling, or any reaction on the inside of your mouth or nose? No Did you need to seek medical attention at a hospital or doctor's office? Yes When did it last happen?      40 years ago If all above answers are "NO", may proceed with cephalosporin use.  Did it involve swelling of the face/tongue/throat, SOB, or low BP? No Did it involve sudden or severe rash/hives, skin peeling, or any reaction on the inside of your mouth or nose? No Did you need to seek medical attention at a hospital or doctor's office? Yes When did it last happen?      40 years ago If all above answers are "NO", may proceed with cephalosporin use.     Medications  Current Outpatient Medications:    Acetaminophen  (TYLENOL  PO), Take 2 tablets by mouth 2 (two) times daily as needed (pain)., Disp: , Rfl:    amLODipine   (NORVASC ) 5 MG tablet, TAKE ONE TABLET BY MOUTH ONCE DAILY, Disp: 90 tablet, Rfl: 3   Ascorbic Acid (VITAMIN C) 1000 MG tablet, Take 1,000 mg by mouth every other day., Disp: , Rfl:    aspirin EC 81 MG tablet, Take 81 mg by mouth daily. Swallow whole., Disp: , Rfl:    atenolol  (TENORMIN ) 50 MG tablet, Take 1 tablet (50 mg total) by mouth daily., Disp: 30 tablet, Rfl: 6   bethanechol  (URECHOLINE ) 25 MG tablet, Take 25 mg by mouth daily. , Disp: , Rfl:    Cholecalciferol 125 MCG/ML LIQD, Take 5,000 tablets by mouth once., Disp: , Rfl:    cyanocobalamin (VITAMIN B12) 1000 MCG  tablet, Take 1,000 mcg by mouth daily., Disp: , Rfl:    glucose blood test strip, Use as instructed, Disp: 100 each, Rfl: 12   losartan  (COZAAR ) 25 MG tablet, Take 1 tablet (25 mg total) by mouth daily., Disp: 30 tablet, Rfl: 6   metFORMIN  (GLUCOPHAGE ) 500 MG tablet, TAKE ONE TABLET BY MOUTH TWICE DAILY, Disp: 60 tablet, Rfl: 0   tamsulosin  (FLOMAX ) 0.4 MG CAPS capsule, Take 1 capsule (0.4 mg total) by mouth daily after supper., Disp: 90 capsule, Rfl: 3   Review of Systems Review of Systems  Constitutional:  Negative for chills, fever and malaise/fatigue.  Eyes:  Negative for blurred vision and double vision.  Respiratory:  Negative for cough and shortness of breath.   Cardiovascular:  Negative for chest pain, palpitations and leg swelling.  Gastrointestinal:  Negative for abdominal pain, constipation, diarrhea, nausea and vomiting.  Genitourinary:  Negative for frequency.  Musculoskeletal:  Negative for myalgias.  Skin:  Negative for itching and rash.  Neurological:  Negative for dizziness, weakness and headaches.  Endo/Heme/Allergies:  Negative for polydipsia.       Objective:    Vitals BP 128/64   Pulse 69   Temp 97.6 F (36.4 C) (Temporal)   Resp 18   Ht 5' 10 (1.778 m)   Wt 169 lb 3.2 oz (76.7 kg)   SpO2 95%   BMI 24.28 kg/m    Physical Examination Physical Exam Constitutional:      Appearance:  Normal appearance. He is not ill-appearing.  Cardiovascular:     Rate and Rhythm: Normal rate and regular rhythm.     Pulses: Normal pulses.     Heart sounds: No murmur heard.    No friction rub. No gallop.  Pulmonary:     Effort: Pulmonary effort is normal. No respiratory distress.     Breath sounds: No wheezing, rhonchi or rales.  Abdominal:     General: Abdomen is flat. Bowel sounds are normal. There is no distension.     Palpations: Abdomen is soft.     Tenderness: There is no abdominal tenderness.  Musculoskeletal:     Right lower leg: No edema.     Left lower leg: No edema.  Skin:    General: Skin is warm and dry.     Findings: No rash.  Neurological:     General: No focal deficit present.     Mental Status: He is alert and oriented to person, place, and time.  Psychiatric:        Mood and Affect: Mood normal.        Behavior: Behavior normal.        Assessment & Plan:   Cerebrovascular disease The patient had a recent thalamic stroke.  He is currently on ASA but stopped taking his statin due myalgias.  I am going to try him on pravastatin  (see below).  We will also refer him to neurology for followup.  We need to continue with secondary prevention with risk factor modification on him.  I have told him he is at high risk for another stroke and we need to actively work on his modifying factors.  Essential hypertension He does not know his medications.  I have asked him to come in next time and bring all his medications with him.  His BP is controlled today.  Type 2 diabetes mellitus with vascular disease (HCC) He was noncompliant with his metformin  use in the past.  He is back on metformin .  I will  check his HgBA1c today.  Hypothyroidism We will check his TFT's today.  He seems euthyroid.  Dyslipidemia He has had statin intolerance to crestor  and lipitor.  I am going to start him on max dose pravastatin .  He is probably going to need a PSK9 inhibitor and this was  discussed with the patient.  We will check his FLP today but he quit his lipitor about 2 weeks ago.    Return in about 3 months (around 05/14/2024).   Selinda Fleeta Finger, MD

## 2024-02-12 NOTE — Assessment & Plan Note (Signed)
 He does not know his medications.  I have asked him to come in next time and bring all his medications with him.  His BP is controlled today.

## 2024-02-12 NOTE — Assessment & Plan Note (Addendum)
 The patient had a recent thalamic stroke.  He is currently on ASA but stopped taking his statin due myalgias.  I am going to try him on pravastatin  (see below).  We will also refer him to neurology for followup.  We need to continue with secondary prevention with risk factor modification on him.  I have told him he is at high risk for another stroke and we need to actively work on his modifying factors.

## 2024-02-13 LAB — HEMOGLOBIN A1C
Est. average glucose Bld gHb Est-mCnc: 171 mg/dL
Hgb A1c MFr Bld: 7.6 % — ABNORMAL HIGH (ref 4.8–5.6)

## 2024-02-26 ENCOUNTER — Ambulatory Visit: Payer: Self-pay

## 2024-02-26 NOTE — Progress Notes (Signed)
 Patient called.  Left message for patient to call back.  I have called and left the patient a voicemail to return our phone call. The patient needs to be informed His diabetes is not controlled.  Call him in Metformin  ER 500mg  tab, take 2 tabs po BID. SABRA

## 2024-03-04 NOTE — Progress Notes (Signed)
 Patient called.  Left message for patient to call back.  I have called and left the patient a voicemail to return our phone call. The patient needs to be informed His diabetes is not controlled.  Call him in Metformin  ER 500mg  tab, take 2 tabs po BID. SABRA

## 2024-03-13 NOTE — Progress Notes (Signed)
 Patient called.  Left message for patient to call back.  I have called and left the patient a voicemail to return our phone call. The patient needs to be informed His diabetes is not controlled.  Call him in Metformin  ER 500mg  tab, take 2 tabs po BID. SABRA  Letter sent.

## 2024-05-10 ENCOUNTER — Ambulatory Visit: Admitting: Internal Medicine

## 2024-05-10 ENCOUNTER — Encounter: Payer: Self-pay | Admitting: Internal Medicine

## 2024-05-10 VITALS — BP 126/72 | HR 58 | Temp 97.0°F | Resp 16 | Ht 70.0 in | Wt 175.2 lb

## 2024-05-10 DIAGNOSIS — E039 Hypothyroidism, unspecified: Secondary | ICD-10-CM | POA: Diagnosis not present

## 2024-05-10 DIAGNOSIS — I679 Cerebrovascular disease, unspecified: Secondary | ICD-10-CM

## 2024-05-10 DIAGNOSIS — I10 Essential (primary) hypertension: Secondary | ICD-10-CM | POA: Diagnosis not present

## 2024-05-10 DIAGNOSIS — E785 Hyperlipidemia, unspecified: Secondary | ICD-10-CM

## 2024-05-10 DIAGNOSIS — E7800 Pure hypercholesterolemia, unspecified: Secondary | ICD-10-CM

## 2024-05-10 DIAGNOSIS — E1159 Type 2 diabetes mellitus with other circulatory complications: Secondary | ICD-10-CM

## 2024-05-10 MED ORDER — PRAVASTATIN SODIUM 80 MG PO TABS: 80.0000 mg | ORAL_TABLET | Freq: Every evening | ORAL | 11 refills | Status: DC

## 2024-05-10 NOTE — Progress Notes (Signed)
 Office Visit  Subjective   Patient ID: David Bird   DOB: 09/29/1935   Age: 88 y.o.   MRN: [id number]   Chief Complaint Chief Complaint  Patient presents with   Follow-up    3 Month follow up     History of Present Illness The patient is a 88 years old male who has a history of cerebrovascular disease with history of stroke.  He was admitted to [facility] in 11/2023 with worsening of left-sided weakness.  He also has a borderline diabetes and was prescribed metformin  that he was not taking.  He was also not taking statin that was prescribed for hyperlipidemia.  CT head without contrast shows no acute intracranial abnormality.  CTA head and neck was negative for large vessel occlusion or high-grade stenosis.  Patient was evaluated by tele Neurology.  Patient was diagnosed with acute CVA of the right thalamus and started on Plavix 75 mg daily and aspirin 81 mg daily for 3 weeks then ASA alone.   The patient was started on atorvastatin  40 mg daily.  His HgBA1c was 7.5% and he was started on metformin  500 mg twice a day.  MRI brain showed a small acute stroke in the right thalamus.  PT OT evaluated and recommended no further recommendation.  We did make an appointment with neurology but the patient states he is not going.   The patient is a 88 year old male who returns for a follow-up visit for his T2 diabetes.  He tells me that he was prediabetic for a number of years but he came back to our office in 12/2022 and at that time, his blood testing showed he had full blown diabetes.  On his last visit, his diabetes was not controlled and we increased his metformin  at that time.  He is currently on Metformin  ER 1000mg  BID.  He is walking with hunting and fishing.  He specifically denies unexplained abdominal pain, nausea or vomiting or diarrhea.   He is routinely check blood sugars daily and runs 100-150's.  His last HgBa1c was done 3 months ago and was 7.6%.  He came in fasting today in  anticipation of lab work. He has no long term complications of diabetic retinopathy, nephropathy, or neuropathy but he does have PAD.  He has claudication complaints when he walks where gets pain in his thighs and hips.  He has seen vascular surgery in [place] where he was diagnosed with PAD in 05/2021.  They did a CTA of his abd/lower extremities and this showed a distal abdominal aortic occlusion as well as bilateral common iliac artery occlusion.  There was a discussion about intervention in which the patient declined and choose conservative management. Dr. Gretta discussed the option of Pletal  although unsure of how well this was going to help him given the complexity of his blockage.   His last eye exam was 1 year ago and he denies any problems with his vision today.    The patient is a 88 year old Caucasian/White male who presents for a follow-up evaluation of hypertension.  Since his last visit, he has not had any problems.  The patient has been checking his blood pressure at home. The patient's blood pressure has ranged systollically in the 140's. The patient's current medications include: atenolol  50 mg daily, amlodipine  5mg  and losartan  25mg  daily. The patient has been tolerating his medications well. The patient denies any headache, visual changes, dizziness, lightheadness, chest pain, shortness of breath, weakness/numbness, and  edema.  He reports there have been no other symptoms noted.    The patient is a 88 year old Caucasian/White male who returns for a regularly scheduled thyroid  check. I saw him a number of years ago where he was on levothyroxine 50 mcg oral tablet. He stopped this medication several years ago.  He claims to have no symptoms suggestive of thyroid  imbalance specifically denying fatigue, cold intolerance, heat intolerance, tremors, anxiety, unexplained weight changes, and insomnia.  He had lab testing of his TSH in 12/2022 which was near normal.     Mr. Kirchman also returns  today for routine followup on his cholesterol.  He used to be on crestor  but it seems he quit this on his own due to myalgias.  They placed him on atorvastatin  40mg  at the hospital as above but he stopped it due to myalgias.  Overall, he states he is doing well and is without any complaints or problems at this time. He specifically denies nausea, vomiting, diarrhea, myalgias, and fatigue. He was on atorvastatin  40mg  at bedtime but he stopped it due to myalgias where he stopped it 2-3 weeks ago. On his last visit, I did put him back on pravastatin  where he was on pravastatin  80mg  at bedtime.  He started taking this every other day due to myalgias and he states it is tolerable taking it every other day.  He is fasting in anticipation for labs today.       Past Medical History Past Medical History:  Diagnosis Date   Cancer (HCC)    Lymphona treated with Chemo and radiation   Coronary artery disease    Diabetes mellitus without complication (HCC)    Type II   Dyslipidemia    Gastric ulcer    Hypertension    Spinal stenosis    Spondylolisthesis    Stroke (HCC)    2013ish, left hand slightly weaker than Right     Allergies Allergies  Allergen Reactions   Penicillins Rash and Hives    Did it involve swelling of the face/tongue/throat, SOB, or low BP? No Did it involve sudden or severe rash/hives, skin peeling, or any reaction on the inside of your mouth or nose? No Did you need to seek medical attention at a hospital or doctor's office? Yes When did it last happen?      40 years ago If all above answers are "NO", may proceed with cephalosporin use.  Did it involve swelling of the face/tongue/throat, SOB, or low BP? No Did it involve sudden or severe rash/hives, skin peeling, or any reaction on the inside of your mouth or nose? No Did you need to seek medical attention at a hospital or doctor's office? Yes When did it last happen?      40 years ago If all above answers are "NO", may  proceed with cephalosporin use.     Medications  Current Outpatient Medications:    Acetaminophen  (TYLENOL  PO), Take 2 tablets by mouth 2 (two) times daily as needed (pain)., Disp: , Rfl:    amLODipine  (NORVASC ) 5 MG tablet, TAKE ONE TABLET BY MOUTH ONCE DAILY, Disp: 90 tablet, Rfl: 3   Ascorbic Acid (VITAMIN C) 1000 MG tablet, Take 1,000 mg by mouth every other day., Disp: , Rfl:    aspirin EC 81 MG tablet, Take 81 mg by mouth daily. Swallow whole., Disp: , Rfl:    atenolol  (TENORMIN ) 50 MG tablet, Take 1 tablet (50 mg total) by mouth daily., Disp: 30 tablet, Rfl:  6   Cholecalciferol 125 MCG/ML LIQD, Take 5,000 tablets by mouth once., Disp: , Rfl:    cyanocobalamin (VITAMIN B12) 1000 MCG tablet, Take 1,000 mcg by mouth daily., Disp: , Rfl:    glucose blood test strip, Use as instructed, Disp: 100 each, Rfl: 12   losartan  (COZAAR ) 25 MG tablet, Take 1 tablet (25 mg total) by mouth daily., Disp: 30 tablet, Rfl: 6   metFORMIN  (GLUCOPHAGE ) 500 MG tablet, TAKE ONE TABLET BY MOUTH TWICE DAILY, Disp: 60 tablet, Rfl: 0   tamsulosin  (FLOMAX ) 0.4 MG CAPS capsule, Take 1 capsule (0.4 mg total) by mouth daily after supper., Disp: 90 capsule, Rfl: 3   Review of Systems Review of Systems  Constitutional:  Negative for chills, fever, malaise/fatigue and weight loss.  Eyes:  Negative for blurred vision and double vision.  Respiratory:  Negative for cough and shortness of breath.   Cardiovascular:  Negative for chest pain, palpitations and leg swelling.  Gastrointestinal:  Negative for abdominal pain, constipation, diarrhea, nausea and vomiting.  Genitourinary:  Positive for frequency.  Musculoskeletal:  Negative for myalgias.  Skin:  Negative for itching and rash.  Neurological:  Negative for dizziness, weakness and headaches.  Endo/Heme/Allergies:  Negative for polydipsia.       Objective:    Vitals BP 126/72   Pulse (!) 58   Temp (!) 97 F (36.1 C) (Temporal)   Resp 16   Ht 5' 10 (1.778  m)   Wt 175 lb 3.2 oz (79.5 kg)   SpO2 97%   BMI 25.14 kg/m    Physical Examination Physical Exam Constitutional:      Appearance: Normal appearance. He is not ill-appearing.  Cardiovascular:     Rate and Rhythm: Normal rate and regular rhythm.     Pulses: Normal pulses.     Heart sounds: No murmur heard.    No friction rub. No gallop.  Pulmonary:     Effort: Pulmonary effort is normal. No respiratory distress.     Breath sounds: No wheezing, rhonchi or rales.  Abdominal:     General: Abdomen is flat. Bowel sounds are normal. There is no distension.     Palpations: Abdomen is soft.     Tenderness: There is no abdominal tenderness.  Musculoskeletal:     Right lower leg: No edema.     Left lower leg: No edema.  Skin:    General: Skin is warm and dry.     Findings: No rash.  Neurological:     General: No focal deficit present.     Mental Status: He is alert and oriented to person, place, and time.  Psychiatric:        Mood and Affect: Mood normal.        Behavior: Behavior normal.        Assessment & Plan:   Cerebrovascular disease He will continue on pravastatin  and ASA 81mg  daily.  He does not want to go to neurology.  We will continue with secondary prevention.  Essential hypertension His BP is currently doing well.  We will continue on his current BP meds.  Type 2 diabetes mellitus with vascular disease (HCC) We did go up on his dose of metformin .  WE will check his HgBa1c today.  Hypothyroidism He seems euthyroid.  We will check his TFT's today.  Dyslipidemia We will check his FLP where he is on pravastatin  80mg  every other day.    Return in about 3 months (around 08/10/2024).   Selinda Salinas  Eyk, MD

## 2024-05-10 NOTE — Assessment & Plan Note (Signed)
 We did go up on his dose of metformin .  WE will check his HgBa1c today.

## 2024-05-10 NOTE — Assessment & Plan Note (Signed)
 He will continue on pravastatin  and ASA 81mg  daily.  He does not want to go to neurology.  We will continue with secondary prevention.

## 2024-05-10 NOTE — Assessment & Plan Note (Signed)
He seems euthyroid.  We will check his TFT's today. 

## 2024-05-10 NOTE — Assessment & Plan Note (Signed)
 His BP is currently doing well.  We will continue on his current BP meds.

## 2024-05-10 NOTE — Assessment & Plan Note (Signed)
 We will check his FLP where he is on pravastatin  80mg  every other day.

## 2024-05-11 LAB — CMP14 + ANION GAP
ALT: 21 IU/L (ref 0–44)
AST: 22 IU/L (ref 0–40)
Albumin: 4.4 g/dL (ref 3.7–4.7)
Alkaline Phosphatase: 63 IU/L (ref 48–129)
Anion Gap: 14 mmol/L (ref 10.0–18.0)
BUN/Creatinine Ratio: 18 (ref 10–24)
BUN: 17 mg/dL (ref 8–27)
Bilirubin Total: 0.2 mg/dL (ref 0.0–1.2)
CO2: 23 mmol/L (ref 20–29)
Calcium: 9.3 mg/dL (ref 8.6–10.2)
Chloride: 95 mmol/L — ABNORMAL LOW (ref 96–106)
Creatinine, Ser: 0.97 mg/dL (ref 0.76–1.27)
Globulin, Total: 2.7 g/dL (ref 1.5–4.5)
Glucose: 93 mg/dL (ref 70–99)
Potassium: 5.1 mmol/L (ref 3.5–5.2)
Sodium: 132 mmol/L — ABNORMAL LOW (ref 134–144)
Total Protein: 7.1 g/dL (ref 6.0–8.5)
eGFR: 76 mL/min/1.73 (ref >59)

## 2024-05-11 LAB — LIPID PANEL
Chol/HDL Ratio: 6.7 ratio — ABNORMAL HIGH (ref 0.0–5.0)
Cholesterol, Total: 220 mg/dL — ABNORMAL HIGH (ref 100–199)
HDL: 33 mg/dL — ABNORMAL LOW (ref >39)
LDL Chol Calc (NIH): 117 mg/dL — ABNORMAL HIGH (ref 0–99)
Triglycerides: 399 mg/dL — ABNORMAL HIGH (ref 0–149)
VLDL Cholesterol Cal: 70 mg/dL — ABNORMAL HIGH (ref 5–40)

## 2024-05-11 LAB — T4, FREE: Free T4: 0.91 ng/dL (ref 0.82–1.77)

## 2024-05-11 LAB — HEMOGLOBIN A1C
Est. average glucose Bld gHb Est-mCnc: 154 mg/dL
Hgb A1c MFr Bld: 7 % — ABNORMAL HIGH (ref 4.8–5.6)

## 2024-05-11 LAB — TSH: TSH: 5.69 u[IU]/mL — ABNORMAL HIGH (ref 0.450–4.500)

## 2024-05-13 ENCOUNTER — Ambulatory Visit: Admitting: Internal Medicine

## 2024-05-30 ENCOUNTER — Other Ambulatory Visit: Payer: Self-pay | Admitting: Internal Medicine

## 2024-05-30 DIAGNOSIS — E1159 Type 2 diabetes mellitus with other circulatory complications: Secondary | ICD-10-CM

## 2024-06-17 ENCOUNTER — Ambulatory Visit: Admitting: Internal Medicine

## 2024-06-17 ENCOUNTER — Encounter: Payer: Self-pay | Admitting: Internal Medicine

## 2024-06-17 VITALS — BP 128/78 | HR 81 | Temp 97.8°F | Resp 18 | Ht 70.0 in | Wt 176.5 lb

## 2024-06-17 DIAGNOSIS — J069 Acute upper respiratory infection, unspecified: Secondary | ICD-10-CM | POA: Insufficient documentation

## 2024-06-17 MED ORDER — OXYMETAZOLINE HCL 0.05 % NA SOLN: 1.0000 | Freq: Two times a day (BID) | NASAL | 0 refills | Status: AC

## 2024-06-17 MED ORDER — GUAIFENESIN ER 600 MG PO TB12: 600.0000 mg | ORAL_TABLET | Freq: Two times a day (BID) | ORAL | 0 refills | Status: AC

## 2024-06-17 NOTE — Assessment & Plan Note (Signed)
 He will take in spray to each nostril twice a day for 3 -4 days. He will take mucinex  600 mg twice a day and if not better then he will call.

## 2024-06-17 NOTE — Progress Notes (Signed)
   Acute Office Visit  Subjective:     Patient ID: David Bird, male    DOB: February 11, 1936, 88 y.o.   MRN: [id number]  Chief Complaint  Patient presents with   office visit    Chest congestion , worse at night does not affect him in night time only at night     HPI Patient is in today for scratchy throat, cough that is worse at night for 5 dys and it is not getting bettre. He is not bringing any flame out as nothing comes out. His voice is getting  hoarse now.   Review of Systems  Constitutional: Negative.   HENT:  Positive for congestion.   Respiratory:  Positive for cough.   Cardiovascular: Negative.         Objective:    BP 128/78   Pulse 81   Temp 97.8 F (36.6 C)   Resp 18   Ht 5' 10 (1.778 m)   Wt 176 lb 8 oz (80.1 kg)   SpO2 96%   BMI 25.33 kg/m    Physical Exam Constitutional:      Appearance: Normal appearance.  Eyes:     Extraocular Movements: Extraocular movements intact.     Pupils: Pupils are equal, round, and reactive to light.  Cardiovascular:     Rate and Rhythm: Normal rate and regular rhythm.     Heart sounds: Normal heart sounds.  Pulmonary:     Effort: Pulmonary effort is normal.     Breath sounds: Normal breath sounds.  Neurological:     Mental Status: He is alert.     No results found for any visits on 06/17/24.      Assessment & Plan:   Problem List Items Addressed This Visit       Respiratory   Viral upper respiratory tract infection - Primary   He will take in spray to each nostril twice a day for 3 -4 days. He will take mucinex  600 mg twice a day and if not better then he will call.       No orders of the defined types were placed in this encounter.   No follow-ups on file.  Roetta Dare, MD

## 2024-06-19 ENCOUNTER — Ambulatory Visit: Payer: Self-pay

## 2024-06-19 NOTE — Progress Notes (Signed)
 Patient called.  Left message for patient to call back.  Per Dr. Fleeta Finger His diabetes is now controlled but his cholesterol is not.  He needs to be started on repatha 140mg  subcut every 2 weeks.  Stop the pravastatin . David Bird

## 2024-06-25 NOTE — Progress Notes (Signed)
 Patient called.  Left message for patient to call back.  Per Dr. Fleeta Finger His diabetes is now controlled but his cholesterol is not.  He needs to be started on repatha 140mg  subcut every 2 weeks.  Stop the pravastatin . SABRA

## 2024-07-08 NOTE — Progress Notes (Signed)
 Patient called.  Unable to reach patient.  Per Dr. Fleeta Finger His diabetes is now controlled but his cholesterol is not.  He needs to be started on repatha 140mg  subcut every 2 weeks.  Stop the pravastatin . .  Letter sent.

## 2024-08-08 ENCOUNTER — Other Ambulatory Visit: Payer: Self-pay | Admitting: Internal Medicine

## 2024-08-08 DIAGNOSIS — E1159 Type 2 diabetes mellitus with other circulatory complications: Secondary | ICD-10-CM

## 2024-08-09 ENCOUNTER — Ambulatory Visit: Admitting: Internal Medicine

## 2024-08-14 ENCOUNTER — Ambulatory Visit: Admitting: Internal Medicine

## 2024-08-14 ENCOUNTER — Other Ambulatory Visit: Payer: Self-pay

## 2024-08-14 MED ORDER — METFORMIN HCL 500 MG PO TABS: 500.0000 mg | ORAL_TABLET | Freq: Two times a day (BID) | ORAL | 0 refills | Status: AC

## 2024-08-28 ENCOUNTER — Ambulatory Visit: Admitting: Internal Medicine

## 2024-08-29 ENCOUNTER — Ambulatory Visit: Admitting: Internal Medicine

## 2024-08-29 ENCOUNTER — Encounter: Payer: Self-pay | Admitting: Internal Medicine

## 2024-08-29 VITALS — BP 130/88 | HR 88 | Temp 98.2°F | Resp 18 | Ht 70.0 in | Wt 170.2 lb

## 2024-08-29 DIAGNOSIS — E1159 Type 2 diabetes mellitus with other circulatory complications: Secondary | ICD-10-CM | POA: Diagnosis not present

## 2024-08-29 DIAGNOSIS — E785 Hyperlipidemia, unspecified: Secondary | ICD-10-CM | POA: Diagnosis not present

## 2024-08-29 DIAGNOSIS — I10 Essential (primary) hypertension: Secondary | ICD-10-CM | POA: Diagnosis not present

## 2024-08-29 DIAGNOSIS — E7800 Pure hypercholesterolemia, unspecified: Secondary | ICD-10-CM

## 2024-08-29 MED ORDER — ROSUVASTATIN CALCIUM 20 MG PO TABS: 20.0000 mg | ORAL_TABLET | Freq: Every day | ORAL | 6 refills | Status: AC

## 2024-08-29 NOTE — Progress Notes (Signed)
 "  Office Visit  Subjective   Patient ID: David Bird   DOB: 1935-10-03   Age: 89 y.o.   MRN: [id number]   Chief Complaint Chief Complaint  Patient presents with   Follow-up    3 month     History of Present Illness   89 years old male with history of hypertension, diabetes and stroke is here for follow-up.  He denies any complaint.  He was admitted to [facility] on May 2025 with worsening of left-sided weakness.  CT head without contrast shows no acute intracranial abnormality.  CTA head and neck was negative for large vessel occlusion.  He was diagnosed with acute right thalamus infarction and started on Plavix and aspirin for 3 weeks then Plavix alone.  He was started on atorvastatin  40 mg daily but he stopped taking that because of muscle and body pain.  He is currently taking pravastatin  80 mg daily but his LDL was 117 on May 10, 2024.    He also has type 2 diabetes mellitus.  He checked his blood sugar and says that his blood sugar was fine.  His hemoglobin A1c on May 10, 2024 was 7.0.  He takes metformin  twice a day.  He has seen eye doctor for diabetic eye exam last year and he need cataract surgery.    He also has a history of hypertension and peripheral arterial disease.  He takes amlodipine  5 mg daily, losartan  25 mg daily and atenolol  50 mg daily.  He do not check his blood pressure at home and his blood pressure is well controlled.  Past Medical History Past Medical History:  Diagnosis Date   Cancer (HCC)    Lymphona treated with Chemo and radiation   Coronary artery disease    Diabetes mellitus without complication (HCC)    Type II   Dyslipidemia    Gastric ulcer    Hypertension    Spinal stenosis    Spondylolisthesis    Stroke (HCC)    2013ish, left hand slightly weaker than Right     Allergies Allergies[1]   Review of Systems Review of Systems  Constitutional: Negative.   HENT: Negative.    Respiratory: Negative.    Cardiovascular:  Negative.   Gastrointestinal: Negative.   Neurological: Negative.        Objective:    Vitals BP 130/88   Pulse 88   Temp 98.2 F (36.8 C)   Resp 18   Ht 5' 10 (1.778 m)   Wt 170 lb 4 oz (77.2 kg)   SpO2 96%   BMI 24.43 kg/m    Physical Examination Physical Exam Constitutional:      Appearance: Normal appearance.  HENT:     Head: Normocephalic and atraumatic.  Cardiovascular:     Rate and Rhythm: Normal rate and regular rhythm.     Heart sounds: Normal heart sounds.  Pulmonary:     Effort: Pulmonary effort is normal.     Breath sounds: Normal breath sounds.  Abdominal:     General: Bowel sounds are normal.     Palpations: Abdomen is soft.  Neurological:     General: No focal deficit present.     Mental Status: He is alert and oriented to person, place, and time.        Assessment & Plan:   Type 2 diabetes mellitus with vascular disease (HCC)   His blood sugar is controlled and I will repeat hemoglobin A1c on next visit.  He will see  eye doctor for diabetic eye exam this year.  Essential hypertension   He has hypertension and his blood pressure is controlled.  Dyslipidemia   His LDL and triglyceride were elevated in spite of taking pravastatin  80 mg daily.  He could not tolerate atorvastatin .  I will stop pravastatin  and start him on rosuvastatin  20 mg daily.  I will repeat lipid panel CMP on next visit.  He will call if he has any side effect.    Return in about 3 months (around 11/26/2024).   Roetta Dare, MD      [1]  Allergies Allergen Reactions   Penicillins Rash and Hives    Did it involve swelling of the face/tongue/throat, SOB, or low BP? No Did it involve sudden or severe rash/hives, skin peeling, or any reaction on the inside of your mouth or nose? No Did you need to seek medical attention at a hospital or doctor's office? Yes When did it last happen?      40 years ago If all above answers are NO, may proceed with cephalosporin use.  Did it  involve swelling of the face/tongue/throat, SOB, or low BP? No Did it involve sudden or severe rash/hives, skin peeling, or any reaction on the inside of your mouth or nose? No Did you need to seek medical attention at a hospital or doctor's office? Yes When did it last happen?      40 years ago If all above answers are NO, may proceed with cephalosporin use.   "

## 2024-08-29 NOTE — Assessment & Plan Note (Signed)
He has hypertension and his blood pressure is controlled.

## 2024-08-29 NOTE — Assessment & Plan Note (Signed)
"    His blood sugar is controlled and I will repeat hemoglobin A1c on next visit.  He will see eye doctor for diabetic eye exam this year. "

## 2024-08-29 NOTE — Assessment & Plan Note (Signed)
"    His LDL and triglyceride were elevated in spite of taking pravastatin  80 mg daily.  He could not tolerate atorvastatin .  I will stop pravastatin  and start him on rosuvastatin  20 mg daily.  I will repeat lipid panel CMP on next visit.  He will call if he has any side effect. "

## 2024-11-26 ENCOUNTER — Ambulatory Visit: Admitting: Internal Medicine
